# Patient Record
Sex: Female | Born: 1965 | Race: White | Hispanic: No | Marital: Married | Smoking: Never smoker
Health system: Southern US, Community
[De-identification: ages and names within clinical notes are randomized; demographics above are authoritative.]

## PROBLEM LIST (undated history)

## (undated) DIAGNOSIS — G4700 Insomnia, unspecified: Secondary | ICD-10-CM

## (undated) DIAGNOSIS — K219 Gastro-esophageal reflux disease without esophagitis: Secondary | ICD-10-CM

## (undated) DIAGNOSIS — D259 Leiomyoma of uterus, unspecified: Secondary | ICD-10-CM

## (undated) DIAGNOSIS — G5603 Carpal tunnel syndrome, bilateral upper limbs: Secondary | ICD-10-CM

## (undated) DIAGNOSIS — E785 Hyperlipidemia, unspecified: Secondary | ICD-10-CM

## (undated) DIAGNOSIS — Z8669 Personal history of other diseases of the nervous system and sense organs: Secondary | ICD-10-CM

## (undated) DIAGNOSIS — I059 Rheumatic mitral valve disease, unspecified: Secondary | ICD-10-CM

## (undated) DIAGNOSIS — F419 Anxiety disorder, unspecified: Secondary | ICD-10-CM

## (undated) DIAGNOSIS — I341 Nonrheumatic mitral (valve) prolapse: Secondary | ICD-10-CM

## (undated) DIAGNOSIS — R51 Headache: Secondary | ICD-10-CM

## (undated) DIAGNOSIS — R519 Headache, unspecified: Secondary | ICD-10-CM

## (undated) DIAGNOSIS — K449 Diaphragmatic hernia without obstruction or gangrene: Secondary | ICD-10-CM

## (undated) DIAGNOSIS — I472 Ventricular tachycardia, unspecified: Secondary | ICD-10-CM

## (undated) DIAGNOSIS — I499 Cardiac arrhythmia, unspecified: Secondary | ICD-10-CM

## (undated) DIAGNOSIS — R112 Nausea with vomiting, unspecified: Secondary | ICD-10-CM

## (undated) DIAGNOSIS — Z9889 Other specified postprocedural states: Secondary | ICD-10-CM

## (undated) DIAGNOSIS — K589 Irritable bowel syndrome without diarrhea: Secondary | ICD-10-CM

## (undated) DIAGNOSIS — M722 Plantar fascial fibromatosis: Secondary | ICD-10-CM

## (undated) DIAGNOSIS — R1031 Right lower quadrant pain: Secondary | ICD-10-CM

## (undated) DIAGNOSIS — N920 Excessive and frequent menstruation with regular cycle: Secondary | ICD-10-CM

## (undated) DIAGNOSIS — G8929 Other chronic pain: Secondary | ICD-10-CM

## (undated) HISTORY — DX: Leiomyoma of uterus, unspecified: D25.9

## (undated) HISTORY — DX: Personal history of other diseases of the nervous system and sense organs: Z86.69

## (undated) HISTORY — PX: PATELLA ARTHROPLASTY: SUR73

## (undated) HISTORY — DX: Other chronic pain: G89.29

## (undated) HISTORY — DX: Hyperlipidemia, unspecified: E78.5

## (undated) HISTORY — PX: OTHER SURGICAL HISTORY: SHX169

## (undated) HISTORY — DX: Irritable bowel syndrome, unspecified: K58.9

## (undated) HISTORY — DX: Right lower quadrant pain: R10.31

## (undated) HISTORY — DX: Excessive and frequent menstruation with regular cycle: N92.0

## (undated) HISTORY — DX: Cardiac arrhythmia, unspecified: I49.9

## (undated) HISTORY — DX: Rheumatic mitral valve disease, unspecified: I05.9

## (undated) HISTORY — DX: Plantar fascial fibromatosis: M72.2

## (undated) HISTORY — PX: SPINE SURGERY: SHX786

## (undated) HISTORY — DX: Anxiety disorder, unspecified: F41.9

## (undated) HISTORY — PX: ABDOMINAL HYSTERECTOMY: SHX81

## (undated) HISTORY — DX: Ventricular tachycardia, unspecified: I47.20

## (undated) HISTORY — DX: Diaphragmatic hernia without obstruction or gangrene: K44.9

## (undated) HISTORY — DX: Insomnia, unspecified: G47.00

## (undated) HISTORY — DX: Gastro-esophageal reflux disease without esophagitis: K21.9

## (undated) HISTORY — DX: Ventricular tachycardia: I47.2

## (undated) HISTORY — DX: Carpal tunnel syndrome, bilateral upper limbs: G56.03

## (undated) SURGERY — ESOPHAGOGASTRODUODENOSCOPY (EGD) WITH PROPOFOL
Anesthesia: Monitor Anesthesia Care

---

## 1996-01-07 HISTORY — PX: KNEE SURGERY: SHX244

## 1997-06-09 ENCOUNTER — Inpatient Hospital Stay (HOSPITAL_COMMUNITY): Admission: AD | Admit: 1997-06-09 | Discharge: 1997-06-12 | Payer: Self-pay | Admitting: Cardiology

## 1997-06-29 ENCOUNTER — Other Ambulatory Visit: Admission: RE | Admit: 1997-06-29 | Discharge: 1997-06-29 | Payer: Self-pay | Admitting: Obstetrics and Gynecology

## 1998-03-15 ENCOUNTER — Emergency Department (HOSPITAL_COMMUNITY): Admission: EM | Admit: 1998-03-15 | Discharge: 1998-03-15 | Payer: Self-pay | Admitting: Emergency Medicine

## 1998-06-24 ENCOUNTER — Emergency Department (HOSPITAL_COMMUNITY): Admission: EM | Admit: 1998-06-24 | Discharge: 1998-06-24 | Payer: Self-pay | Admitting: Emergency Medicine

## 1998-06-25 ENCOUNTER — Inpatient Hospital Stay (HOSPITAL_COMMUNITY): Admission: EM | Admit: 1998-06-25 | Discharge: 1998-06-27 | Payer: Self-pay | Admitting: Emergency Medicine

## 1998-07-23 ENCOUNTER — Other Ambulatory Visit: Admission: RE | Admit: 1998-07-23 | Discharge: 1998-07-23 | Payer: Self-pay | Admitting: Obstetrics and Gynecology

## 1998-09-20 ENCOUNTER — Encounter: Payer: Self-pay | Admitting: Obstetrics and Gynecology

## 1998-09-20 ENCOUNTER — Ambulatory Visit (HOSPITAL_COMMUNITY): Admission: RE | Admit: 1998-09-20 | Discharge: 1998-09-20 | Payer: Self-pay | Admitting: Obstetrics and Gynecology

## 1998-11-04 ENCOUNTER — Emergency Department (HOSPITAL_COMMUNITY): Admission: EM | Admit: 1998-11-04 | Discharge: 1998-11-04 | Payer: Self-pay | Admitting: *Deleted

## 1999-02-04 ENCOUNTER — Ambulatory Visit (HOSPITAL_COMMUNITY): Admission: RE | Admit: 1999-02-04 | Discharge: 1999-02-04 | Payer: Self-pay | Admitting: Neurosurgery

## 1999-02-04 ENCOUNTER — Encounter: Payer: Self-pay | Admitting: Orthopedic Surgery

## 1999-03-19 ENCOUNTER — Encounter: Payer: Self-pay | Admitting: Orthopedic Surgery

## 1999-03-19 ENCOUNTER — Ambulatory Visit (HOSPITAL_COMMUNITY): Admission: RE | Admit: 1999-03-19 | Discharge: 1999-03-19 | Payer: Self-pay | Admitting: Orthopedic Surgery

## 1999-05-07 HISTORY — PX: OTHER SURGICAL HISTORY: SHX169

## 1999-05-22 ENCOUNTER — Inpatient Hospital Stay (HOSPITAL_COMMUNITY): Admission: RE | Admit: 1999-05-22 | Discharge: 1999-05-23 | Payer: Self-pay | Admitting: Orthopaedic Surgery

## 1999-05-26 ENCOUNTER — Emergency Department (HOSPITAL_COMMUNITY): Admission: EM | Admit: 1999-05-26 | Discharge: 1999-05-26 | Payer: Self-pay | Admitting: Emergency Medicine

## 1999-05-26 ENCOUNTER — Encounter: Payer: Self-pay | Admitting: Emergency Medicine

## 1999-06-28 ENCOUNTER — Inpatient Hospital Stay (HOSPITAL_COMMUNITY): Admission: RE | Admit: 1999-06-28 | Discharge: 1999-06-29 | Payer: Self-pay | Admitting: Internal Medicine

## 1999-06-28 ENCOUNTER — Encounter: Payer: Self-pay | Admitting: Internal Medicine

## 1999-07-24 ENCOUNTER — Other Ambulatory Visit: Admission: RE | Admit: 1999-07-24 | Discharge: 1999-07-24 | Payer: Self-pay | Admitting: Obstetrics and Gynecology

## 1999-09-23 ENCOUNTER — Ambulatory Visit (HOSPITAL_COMMUNITY): Admission: RE | Admit: 1999-09-23 | Discharge: 1999-09-23 | Payer: Self-pay | Admitting: Obstetrics and Gynecology

## 1999-09-23 ENCOUNTER — Encounter: Payer: Self-pay | Admitting: Obstetrics and Gynecology

## 2000-01-07 HISTORY — PX: CARDIAC ELECTROPHYSIOLOGY MAPPING AND ABLATION: SHX1292

## 2000-08-04 ENCOUNTER — Other Ambulatory Visit: Admission: RE | Admit: 2000-08-04 | Discharge: 2000-08-04 | Payer: Self-pay | Admitting: Obstetrics and Gynecology

## 2001-09-08 ENCOUNTER — Other Ambulatory Visit: Admission: RE | Admit: 2001-09-08 | Discharge: 2001-09-08 | Payer: Self-pay | Admitting: Obstetrics and Gynecology

## 2002-12-20 ENCOUNTER — Other Ambulatory Visit: Admission: RE | Admit: 2002-12-20 | Discharge: 2002-12-20 | Payer: Self-pay | Admitting: Obstetrics and Gynecology

## 2003-02-22 ENCOUNTER — Ambulatory Visit (HOSPITAL_COMMUNITY): Admission: RE | Admit: 2003-02-22 | Discharge: 2003-02-22 | Payer: Self-pay | Admitting: Internal Medicine

## 2003-04-10 ENCOUNTER — Encounter: Payer: Self-pay | Admitting: Internal Medicine

## 2003-12-06 ENCOUNTER — Ambulatory Visit: Payer: Self-pay | Admitting: Internal Medicine

## 2003-12-25 ENCOUNTER — Ambulatory Visit: Payer: Self-pay | Admitting: Internal Medicine

## 2003-12-29 ENCOUNTER — Ambulatory Visit: Payer: Self-pay | Admitting: Internal Medicine

## 2004-05-17 ENCOUNTER — Ambulatory Visit: Payer: Self-pay | Admitting: Internal Medicine

## 2004-05-29 ENCOUNTER — Ambulatory Visit: Payer: Self-pay | Admitting: Internal Medicine

## 2004-07-19 ENCOUNTER — Ambulatory Visit (HOSPITAL_COMMUNITY): Admission: RE | Admit: 2004-07-19 | Discharge: 2004-07-19 | Payer: Self-pay | Admitting: Obstetrics and Gynecology

## 2004-07-26 ENCOUNTER — Encounter: Admission: RE | Admit: 2004-07-26 | Discharge: 2004-07-26 | Payer: Self-pay | Admitting: Obstetrics and Gynecology

## 2005-03-12 ENCOUNTER — Encounter: Admission: RE | Admit: 2005-03-12 | Discharge: 2005-03-12 | Payer: Self-pay | Admitting: Obstetrics and Gynecology

## 2005-08-18 ENCOUNTER — Ambulatory Visit: Payer: Self-pay | Admitting: Internal Medicine

## 2005-10-17 ENCOUNTER — Encounter: Admission: RE | Admit: 2005-10-17 | Discharge: 2005-10-17 | Payer: Self-pay | Admitting: Obstetrics and Gynecology

## 2006-09-28 ENCOUNTER — Ambulatory Visit: Payer: Self-pay | Admitting: Internal Medicine

## 2006-10-20 ENCOUNTER — Encounter: Admission: RE | Admit: 2006-10-20 | Discharge: 2006-10-20 | Payer: Self-pay | Admitting: Obstetrics and Gynecology

## 2007-05-06 DIAGNOSIS — K449 Diaphragmatic hernia without obstruction or gangrene: Secondary | ICD-10-CM | POA: Insufficient documentation

## 2007-05-06 DIAGNOSIS — K219 Gastro-esophageal reflux disease without esophagitis: Secondary | ICD-10-CM

## 2007-05-06 DIAGNOSIS — I499 Cardiac arrhythmia, unspecified: Secondary | ICD-10-CM | POA: Insufficient documentation

## 2007-05-06 DIAGNOSIS — I059 Rheumatic mitral valve disease, unspecified: Secondary | ICD-10-CM

## 2007-05-06 DIAGNOSIS — M722 Plantar fascial fibromatosis: Secondary | ICD-10-CM

## 2007-05-06 DIAGNOSIS — G5600 Carpal tunnel syndrome, unspecified upper limb: Secondary | ICD-10-CM

## 2007-05-06 DIAGNOSIS — K589 Irritable bowel syndrome without diarrhea: Secondary | ICD-10-CM | POA: Insufficient documentation

## 2007-06-07 ENCOUNTER — Ambulatory Visit: Payer: Self-pay | Admitting: Internal Medicine

## 2007-06-07 DIAGNOSIS — R1031 Right lower quadrant pain: Secondary | ICD-10-CM

## 2007-06-09 ENCOUNTER — Ambulatory Visit (HOSPITAL_COMMUNITY): Admission: RE | Admit: 2007-06-09 | Discharge: 2007-06-09 | Payer: Self-pay | Admitting: Internal Medicine

## 2007-10-06 ENCOUNTER — Ambulatory Visit: Payer: Self-pay | Admitting: Internal Medicine

## 2007-11-15 ENCOUNTER — Encounter: Admission: RE | Admit: 2007-11-15 | Discharge: 2007-11-15 | Payer: Self-pay | Admitting: Obstetrics and Gynecology

## 2008-10-04 DIAGNOSIS — E785 Hyperlipidemia, unspecified: Secondary | ICD-10-CM

## 2008-10-04 DIAGNOSIS — I472 Ventricular tachycardia, unspecified: Secondary | ICD-10-CM | POA: Insufficient documentation

## 2008-10-05 ENCOUNTER — Ambulatory Visit: Payer: Self-pay | Admitting: Internal Medicine

## 2009-03-20 ENCOUNTER — Encounter: Admission: RE | Admit: 2009-03-20 | Discharge: 2009-03-20 | Payer: Self-pay | Admitting: Obstetrics and Gynecology

## 2009-06-07 ENCOUNTER — Telehealth (INDEPENDENT_AMBULATORY_CARE_PROVIDER_SITE_OTHER): Payer: Self-pay | Admitting: *Deleted

## 2009-10-30 ENCOUNTER — Encounter (INDEPENDENT_AMBULATORY_CARE_PROVIDER_SITE_OTHER): Payer: Self-pay | Admitting: *Deleted

## 2009-12-18 ENCOUNTER — Telehealth (INDEPENDENT_AMBULATORY_CARE_PROVIDER_SITE_OTHER): Payer: Self-pay | Admitting: *Deleted

## 2010-01-24 ENCOUNTER — Encounter: Payer: Self-pay | Admitting: Internal Medicine

## 2010-01-24 ENCOUNTER — Ambulatory Visit
Admission: RE | Admit: 2010-01-24 | Discharge: 2010-01-24 | Payer: Self-pay | Source: Home / Self Care | Attending: Internal Medicine | Admitting: Internal Medicine

## 2010-01-27 ENCOUNTER — Encounter: Payer: Self-pay | Admitting: Obstetrics and Gynecology

## 2010-02-07 NOTE — Progress Notes (Signed)
   Spoke with Shylie on 2 Different Occasions today, She is Very persistant on picking the records up today,I called Renee W/ [facility] asking her if she recieved the ROI that was sent over, she has not recieved it yet,I called the PT back and asked her to sign another ROI when she comes to pick up the records she agreed,Pt will pick up records today. Cala Bradford Mesiemore  June 07, 2009 1:12 PM

## 2010-02-07 NOTE — Procedures (Signed)
Summary: Colonoscopy Report  Colonoscopy Report   Imported By: Lenard Forth 06/11/2009 17:05:06  _____________________________________________________________________  External Attachment:    Type:   Image     Comment:   External Document

## 2010-02-07 NOTE — Assessment & Plan Note (Signed)
Summary: F1Y/MJ  Medications Added XANAX 1 MG TABS (ALPRAZOLAM) at bedtime LIPITOR 10 MG TABS (ATORVASTATIN CALCIUM) once daily      Allergies Added:   Visit Type:  Follow-up Primary Provider:  Lucas Mallow MD   History of Present Illness: [name] ([name])  returns today for followup.  She is a pleasant middle aged woman with a h/o NSVT who returns today for followup.  In the last year she has had minimal palpitations but has gained more weight.  She states that she is exercising regularly. She admits to overeating.  She denies syncope.  Current Medications (verified): 1)  Tambocor 50 Mg  Tabs (Flecainide Acetate) .Marland Kitchen.. 1 Twice Daily 2)  Xanax 1 Mg Tabs (Alprazolam) .... At Bedtime 3)  Lipitor 10 Mg  Allergies (verified): 1)  ! Verapamil  Past History:  Past Medical History: Last updated: 10/04/2008 Current Problems:  ABNORMAL HEART RHYTHMS (ICD-427.9) MITRAL VALVE DISORDERS (ICD-424.0) VENTRICULAR TACHYCARDIA (ICD-427.1) RLQ PAIN (ICD-789.03) IBS (ICD-564.1) HIATAL HERNIA (ICD-553.3) GERD (ICD-530.81) PLANTAR FASCIITIS (ICD-728.71) CARPAL TUNNEL SYNDROME, BILATERAL (ICD-354.0) DYSLIPIDEMIA (ICD-272.4)  Past Surgical History: Last updated: 05/06/2007 knee surgery  Review of Systems  The patient denies chest pain, syncope, dyspnea on exertion, and peripheral edema.    Vital Signs:  Patient profile:   45 year old female Height:      64 inches Weight:      166 pounds BMI:     28.60 Pulse rate:   87 / minute BP sitting:   128 / 64  (left arm)  Vitals Entered By: Laurance Flatten CMA (January 24, 2010 2:44 PM)   Physical Exam  General:  Well developed, well nourished, in no acute distress.  HEENT: normal Neck: supple. No JVD. Carotids 2+ bilaterally no bruits Cor: RRR no rubs, gallops or murmur Lungs: CTA Ab: soft, nontender. nondistended. No HSM. Good bowel sounds Ext: warm. no cyanosis, clubbing or edema Neuro: alert and oriented.  Grossly nonfocal. 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  She will continue her meds. Her updated medication list for this problem includes:    Tambocor 50 Mg Tabs (Flecainide acetate) .Marland Kitchen... 1 twice daily  Problem # 2:  DYSLIPIDEMIA (ICD-272.4) I have asked that she reduce her dietary fat intake, exercise more and continue her statin therapy Her updated medication list for this problem includes:    Lipitor 10 Mg Tabs (Atorvastatin calcium) ..... Once daily  Patient Instructions: 1)  Your physician recommends that you continue on your current medications as directed. Please refer to the Current Medication list given to you today. 2)  Your physician wants you to follow-up in: 1 year  You will receive a reminder letter in the mail two months in advance. If you don't receive a letter, please call our office to schedule the follow-up appointment. Prescriptions: LIPITOR 10 MG TABS (ATORVASTATIN CALCIUM) once daily  #30 x 0   Entered by:   Claris Gladden RN   Authorized by:   Laren Boom, MD, [facility]   Signed by:   Claris Gladden RN on 01/24/2010   Method used:   [phone]       [address]       [place], [place]  [address]       Ph: [phone]  or [phone]       Fax: [phone]   RxID:   [id number] TAMBOCOR 50 MG  TABS (FLECAINIDE ACETATE) 1 twice daily  #60 Tablet x 0   Entered by:   Claris Gladden RN   Authorized by:   Laren Boom, MD, [facility]   Signed by:   Claris Gladden RN on 01/24/2010   Method used:   [facility]        [facility]  [address] [phone] (retail[facility]       [address] [place], [place]  [address]       Ph: [phone] or [phone]       Fax: [phone]   RxID:   [id number]

## 2010-02-07 NOTE — Letter (Signed)
Summary: Appointment - Missed  [facility], Main Office  [address]   [place], [place] [address]   Phone: [phone]  Fax: [phone]     October 30, 2009 MRN: [id number]   [facility] [address] [place], [place]  [address]   Dear [name],  Our records indicate you missed your appointment on 10-15-09  with Dr. Ladona Ridgel .                                    It is very important that we reach you to reschedule this appointment. We look forward to participating in your health care needs. Please contact us at the number listed above at your earliest convenience to reschedule this appointment.     Sincerely,    Glass blower/designer

## 2010-02-07 NOTE — Progress Notes (Signed)
   Request receievd from [facility] sent to [facility] Mes[facility]  December 18, 2009 1:05 PM

## 2010-04-24 ENCOUNTER — Other Ambulatory Visit: Payer: Self-pay | Admitting: Physician Assistant

## 2010-04-24 ENCOUNTER — Ambulatory Visit
Admission: RE | Admit: 2010-04-24 | Discharge: 2010-04-24 | Disposition: A | Discharge status: Home / Self Care | Payer: 59 | Source: Ambulatory Visit | Attending: Physician Assistant | Admitting: Physician Assistant

## 2010-04-24 DIAGNOSIS — M542 Cervicalgia: Secondary | ICD-10-CM

## 2010-05-13 ENCOUNTER — Other Ambulatory Visit: Payer: Self-pay | Admitting: *Deleted

## 2010-05-13 MED ORDER — ATORVASTATIN CALCIUM 10 MG PO TABS: 10.0000 mg | ORAL_TABLET | Freq: Every day | ORAL | Status: DC

## 2010-05-21 NOTE — Assessment & Plan Note (Signed)
[facility]                         ELECTROPHYSIOLOGY OFFICE NOTE   NAME:HODGESDaniya, [name]                       MRN:          [id number]  DATE:10/06/2007                            DOB:          12/24/1965    HISTORY OF PRESENT ILLNESS:  Ms. [name] returns today for followup.  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  She returns today for  followup.  She has recently undergone stress of losing her father who  died of cancer several months ago.  Despite this, she has had very  little in the way of increased palpitations or symptomatic VT.  She had  no other specific complaints today.   MEDICATIONS:  1. Flecainide 50 mg twice a day.  2. Multiple vitamin.  3. Lipitor (do not know the dose).   PHYSICAL EXAMINATION:  GENERAL:  She is a pleasant, well-appearing young  woman in no distress.  VITAL SIGNS:  Blood pressure was 106/70, the pulse was 73 and regular,  the respirations were 18.  The weight was 138 pounds down 8 pounds from  her visit a year ago.  NECK:  Revealed no jugular venous distention.  LUNGS:  Clear bilaterally to auscultation.  No wheezes, rales, or  rhonchi are present.  CARDIOVASCULAR:  Regular rate and rhythm with normal S1 and S2.  There  are no murmurs, rubs, gallops.  EXTREMITIES:  Demonstrated no edema.   Her EKG today demonstrates sinus rhythm with normal axis and intervals.   IMPRESSION:  1. 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  2. Dyslipidemia, on statin therapy.   DISCUSSION:  Ms. [name]' VT is stable or she is tolerating flecainide  very nicely with nice control of her symptoms.  She will continue  flecainide at 50 twice a day, though if her symptoms would increase,  we  would certainly think about increasing that dose as needed.   PLAN:  To see her back in 1-year or sooner should she have worsening  symptoms.     Doylene Canning. Ladona Ridgel, MD  Electronically Signed    GWT/MedQ  DD: 10/06/2007  DT: 10/07/2007  Job #: [id number]

## 2010-05-21 NOTE — Assessment & Plan Note (Signed)
[facility]                         ELECTROPHYSIOLOGY OFFICE NOTE   NAME:[name], [name]                       MRN:          [id number]  DATE:09/28/2006                            DOB:          1965-09-25    SUBJECTIVE:  Ms. [name] returns today for followup.  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  She returns  today for followup.  Overall there have been rare palpitations in the  last year even despite being on low doses of flecainide.  Otherwise,  there are no complaints today.  She states that when she lies down at  night-time, sometimes she will feel like her heart is racing for a few  beats and then stops on its own.   MEDICATIONS:  1. Flecainide 50 mg twice daily.  2. Multiple vitamins.   PHYSICAL EXAMINATION:  GENERAL APPEARANCE:  She is a pleasant well-  appearing in no distress.  VITAL SIGNS:  Blood pressure 122/66, the pulse is 70 and regular,  respirations are 18 and the weight is 146 pounds.  NECK:  Revealed no jugular venous distention.  LUNGS:  Clear bilaterally to auscultation.  No rales, rhonchi or  wheezing are present.  CARDIOVASCULAR:  Exam revealed a regular rate and rhythm with normal S1  and S2.  EXTREMITIES:  Demonstrated no edema.   IMPRESSION:  1. Nonsustained ventricular tachycardia.  2. Chronic flecainide therapy.   DISCUSSION:  Overall, Ms. [name] is stable.  Her ventricular tachycardia  has been quiescent.  I will see her back in the office in one year,  sooner should she have worsening symptoms.     Doylene Canning. Ladona Ridgel, MD  Electronically Signed    GWT/MedQ  DD: 09/28/2006  DT: 09/29/2006  Job #: [id number]

## 2010-05-24 NOTE — Op Note (Signed)
[place]  Patient:    [name], [name]                       MRN: [id number] Proc. Date: 05/22/99 Adm. 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  PROCEDURE:  Left C6-7 anterior cervical diskectomy and fusion, left iliac crest bone graft.  SURGEON:  Mark C. Ophelia Charter, M.D.  ASSISTANT:  Colon Flattery. Ollen Bowl, M.D.  ANESTHESIA:  GOT.  ESTIMATED BLOOD LOSS:  100 cc.  DESCRIPTION OF PROCEDURE:  After the induction of general anesthesia and orotracheal intubation, standard prepping and draping of left iliac crest and neck with head halter traction applied, sterile skin marker was used to mark the skin and the skin fold at the C6-7 level.  The area was squared with towels and Betadine Vidrape was applied.  Sterile Mayo stand at the head and thyroid sheets and drapes.  An incision was made in the midline, extending to the left and platysma was split in line with the fibers.  Blunt dissection was performed and there was a small vein on the under surface of the sternocleidomastoid that was bleeding and required cauterization.  Needle localization of C6-7 was performed with cross table lateral x-ray confirming this was the appropriate level.  A Cloward 12 mm drill was then drilled back to the posterior cortex.  The posterior cortex was thin and ballotable.  The hole was centered slightly superior to midline, taking a little bit more of the C6 than C7 body.  There was good cancellous bone on both surfaces and the gutters were stripped, taking out degenerative disk material.  The nucleus material actually appeared more like degenerative lumbar disk than the usual normal cervical disk material.  The posterior longitudinal ligament was taken down  with complete visualization of the cord, and there was some disk material in the lateral gutter with uncinate hypertrophy and spurs removed with 1 mm and 2 mm Kerrison using the operative microscope and microdissection technique.  Once the spurs were removed, the cord was well-decompressed.  Repeat irrigation was performed.  A 14 mm plug was taken from left ______, trimmed down to size, and impacted into place.  The patient tolerated the procedure well and was transferred to the recovery room in stable condition after closure with 3-0 Vicryl, 4-0 Vicryl in the neck with a Hemovac drain in the neck.  ______ was closed with 0 Vicryl in the fascia, 2-0 Vicryl in the subcutaneous tissue, and Marcaine in the skin. DD:  05/22/99 TD:  05/27/99 Job: [id number]

## 2010-05-24 NOTE — Assessment & Plan Note (Signed)
[facility]                           ELECTROPHYSIOLOGY OFFICE NOTE   NAME:HODGESCarie, Larsen                       MRN:          [id number]  DATE:08/18/2005                            DOB:          Mar 23, 1965    Ms. [name] returns today for followup.  She is a very pleasant 45 year old  woman with a history of RV outflow tract VT, who has been nicely controlled  over the past several years with flecainide therapy.  The patient notes that  she has recently gone off of her oral contraceptive agents.  The patient  states that several months ago she started having problems with frequent and  difficult periods, and for this reason started taking estrogen supplements.  Her periods regularized; however, she noted increasing palpitations while  she was on the estrogen.  She has subsequently stopped taking the estrogen  secondary to the above, as well as concerns that there might be some  increased risk on supplement estrogen therapy.  The patient has no coronary  disease and normal LV function and her only other problem is her problem  with gastroesophageal reflux.   On physical exam, she is a pleasant, well-appearing young woman in no  distress.  Blood pressure was 110/80.  The pulse was 83 and regular.  Respirations were 18.  The weight was 137 pounds.  The neck revealed no  jugular venous distention.  Lungs were clear bilaterally to auscultation.  There were no wheezes, rales or rhonchi.  The cardiovascular exam revealed a  regular rate and rhythm with normal S1 and S2.  There were no irregular  heart sounds noted.  I do not appreciate any murmurs today.  Extremities  demonstrated no edema.   Her EKG demonstrates a sinus rhythm with borderline left atrial enlargement.   IMPRESSION:  1. Right ventricular outflow tract ventricular tachycardia.  2. Flecainide therapy secondary to #1.  3. History of gastroesophageal reflux disease.   DISCUSSION:  Ms.   [name]' palpitations have increased on her estrogen pills.  The mechanism of this is unclear to me.  However, I have recommended that if  she desires to go back on the estrogen, that she can take 150 mg of  flecainide by taking 50 mg twice a day and taking another 50 mg as needed on  days that she is having increasing palpitations.  Ultimately, we may have to  increase her dose of flecainide to either 150 or even 200 mg daily, but for  now we will plan to proceed in this way.  I will plan to see the patient  back in 1 year, sooner should she have recurrent symptoms.                                   Doylene Canning. Ladona Ridgel, MD   GWT/MedQ  DD:  08/18/2005  DT:  08/19/2005  Job #:  [id number]

## 2010-05-24 NOTE — Procedures (Signed)
[place]  Patient:    [name], [name]                       MRN: [id number] Proc. Date: 06/27/99 Adm. Date:  [id number] Attending:  Lewayne Bunting CC:         Thomas A. Patty Sermons, M.D.             Kathrine Cords, R.N.                           Procedure Report  PROCEDURE PERFORMED:  Electrophysiologic study and attempted catheter ablation of nonsustained ventricular tachycardia.  I:  INTRODUCTION:  The patient is a very pleasant 45 year old woman with a ten year history of tachy palpitations.  These have been documented due to PVCs and nonsustained ventricular tachycardia.  The morphology of her arrhythmia is typically a left bundle inferior axis morphology.  She has typically PVCs in a bigeminal fashion. She has been treated with multiple medications including multiple beta blocker, Sotalol, and calcium channel blockers.  She was initially tried on Verapamil but had an allergic reaction to this medication. She has also been tried on Cardizem with no improvement and she notes that she feels fatigued on this medication.  The patient for this reason has requested attempted catheter ablation therapy.  She is subsequently referred for additional evaluation.  II:  PROCEDURE:  After informed consent was obtained, the patient was taken to the diagnostic EP Lab in a fasting state.  After the usual preparation and draping, 30 cc of Lidocaine was infiltrated in the right femoral region.  A 6 French quadripolar catheter was inserted percutaneously in the right femoral vein and advanced to the RV apex.  A 6 French quadripolar catheter was inserted percutaneously in the right femoral vein and advanced to the His bundle region.  A 7 French quadripolar ablation catheter was inserted percutaneously in the right femoral vein and advanced to the RV outflow track region.  At baseline, the patient had PVCs occurring in a trigeminy and quadrigeminal fashion.  There was  no sustained or nonsustained VT.  Programmed ventricular stimulation as well as rapid ventricular pacing were both carried out from the RV apex at patient cycle length of 500 and 400 msec. In addition rapid ventricular pacing was carried out at cycle lengths down to 250 msec. This demonstrated no evidence of induction of ventricular tachycardia.  This was performed utilizing S1 and S2; S1, S2 and S3; and S1, S2, S3, S4 stimuli. At this point isoproterenol was infused at 1 mcg per minute.  After about six minutes the patient had a response to isoproterenol and again; however, there was no sustained ventricular arrhythmias.  There was PVCs again in a bigeminal and trigeminal fashion.  The ablation catheter was removed into the region of the RV outflow track and mapping was carried out at multiple locations. Despite multiple sites of mapping in both the septal, anterior and free wall regions as well as the posterior region, the earliest activation sequence was no better than 0 with the surface electrocardiogram QRS.  The mapping catheter was maneuvered down into a more inferior region in the RV outflow track and this demonstrated somewhat earlier ventricular activations approximately 5 msec presystolic.  At this point, a second mapping/ablation catheter was maneuvered into the region of the right ventricle. At the most inferior portion of the RV outflow track along the anterior region  essentially out into the right ventricle along its anterior surface, sites of up to 15 to 20 msec were found which were presystolic.  It should be noted that mapping was made markedly more difficult because of PVCs arising off of the mapping catheters and because the patient occasionally had additional PVCs morphologies. In addition mapping was made difficult because the patients PVCs would frequently resolve for sustained periods of time.  A total of 6 RF energy applications were delivered to the region at the  base of the RV outflow track and anterior superior portion of the right ventricle. During RF energy application, there was no exceleration of the patients ventricular arrhythmias although there were frequent PVCs.  After each ablation the patient had recurrent PVCs.  At this point, because no sustained ventricular arrhythmias could be induced and because of the unusual location of the patients PVC focus with much difficulty in mapping, the catheters were removed.  Hemostasis was assured.  III:  COMPLICATIONS:  The patients procedure was complicated by chest pain during RF energy application. She remained hemodynamically stable and a 2D echocardiogram will be obtained to exclude any minimal pericardial effusion.  IV:  RESULTS:  A. Baseline 12 lead electrocardiogram.  The baseline 12 lead EKG demonstrates normal sinus rhythm with normal axis and intervals and frequent PVCs.  B. Baseline intervals.  The sinus node cycle length was 581 msec.  The QRS duration was 108 msec.  The AH interval was 66 msec.  HV interval 40 msec.  C. Rapid ventricular pacing.  Rapid ventricular pacing was carried out from the RV apex at a cycle length of 500 msec and decremented down to 250 msec. This demonstrated no inducible VT.  The patient did have somewhat more frequent occurring PVCs.  D. Programmed ventricular stimulation. Programmed ventricular stimulation was carried out from the RV apex at a basic dry cycle length of 500 and 400 msec. S1 and S2; S1, S2, S3; and S1, S2, S3, S4 stimuli were subsequently delivered with each carried down to ventricular refractions. During programmed ventricular stimulation there was no inducible VT.  E. Arrhythmias observed.  Rare episodes of nonsustained VT and frequent PVCs. Morphology was left bundle inferior axis.  They were rare and nonsustained.  F. Mapping.   Mapping of the patients PVCs demonstrated the earliest atrial activation at the base of the RV outflow  track anterior and superior along the right ventricle.  At this location, 6 RF energy applications were delivered  with the longest RF delivered for 32 seconds. It should be noted that the patient had excruciating chest pain during RF energy application.  There is no apparent effect on the PVC frequency.  V:  CONCLUSIONS:  Demonstrates no evidence of inducible ventricular tachycardia.  The patient had frequent PVCs up to a bigeminal fashion with rare couplets.  Attempted suppression of her PVCs was performed with 6 RF energy applications delivered to the anterior superior portion of the RV as well as the base of the RV outflow track. Despite earliest activations occurring at approximately 15 to 20 msec presystolic there was no evidence of any improvement in the patients ventricular ectopy.  PLAN:  At this time will be to obtain an MRI of the heart as well as to obtain a EKG as well as consideration of initiation of Flecainide therapy.DD: 06/27/99 TD:  06/30/99 Job: [id number]

## 2010-05-24 NOTE — Assessment & Plan Note (Signed)
[facility]                                 ON-CALL NOTE   NAME:[name], [name]                         MRN:          [id number]  DATE:01/11/2007                            DOB:          [birth year]    SUPERVISING PHYSICIAN:  Pricilla Riffle, MD, [facility]   PRIMARY CARDIOLOGIST:  Doylene Canning. Ladona Ridgel, M.D.   Ms. [name] is a 45 year old female patient of Dr. Lubertha Basque with a  history of nonsustained ventricular tachycardia who has been on  flecainide therapy for at least 3 years.  She states that her father has  recently died, and she has not been able to renew her prescription.  However, [facility] has reportedly faxed in request forms on January 06, 2007,  January 08, 2007 and January 11, 2007 requesting an authorization for the  refills, and the office has not responded to these repeated messages.  She calls this evening requesting a refill as she does not have any more  flecainide for this evening's dose.   Thus, I contacted [facility] at [phone] and authorized them to refill  flecainide 50 mg 1 tablet p.o. b.i.d. with 6 refills and 60 tablets.  They stated that they would do this.      Joellyn Rued, PA-C  Electronically Signed      Pricilla Riffle, MD, [facility]  Electronically Signed   EW/MedQ  DD: 01/11/2007  DT: 01/12/2007  Job #: [id number]

## 2010-05-24 NOTE — Discharge Summary (Signed)
[place]. [facility]  Patient:    [name], [name]                         MRN: [id number] Adm. Date:  06/27/99 Disc. Date: 06/29/99 Attending:  Rudene Christians. Ladona Ridgel, M.D. [facility] Dictator:   Tereso Newcomer, P.A.                           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  ADMISSION HISTORY:  This 45 year old female with a long history of tachy palpitations was originally seen in the office on June 19, 1999.  Tachy palpitations were found to be due to PVCs, ventricular couplets and nonsustained ventricular tachycardia.  She has reported near syncope with these. She had been treated with a number of medications in the past including multiple beta blockers, calcium channel blockers and Betapace.  Despite these therapies she continued to be symptomatic and had frequent PVCs, bigeminal fashion, episodes of nonsustained ventricular tachycardia.  These appeared to be originating from the right ventricular outflow tract region and are strongly positive at 2, 3, and F and left bundle pattern.  It was felt that the patient should underwent EP study and possible radiofrequency ablation.  LABORATORY DATA AND OTHER ANCILLARY TEST:  EKG on June 28, 1999 showed normal sinus rhythm.  2D echocardiogram on June 27, 1999 showed normal left ventricular wall thickness and normal ejection fraction. No mitral regurgitation noted.  Trace tricuspid regurgitation noted.  No aortic stenosis or aortic regurgitation noted.  HOSPITAL COURSE:  On June 27, 1999 the patient underwent electrophysiology study.  Attempted radiofrequency ablation of nonsustained ventricular tachycardia was performed without clear cut success.  The study showed that the PVCs mapped to the anterior right ventricle and not the right ventricular outflow  track.  There was no inducible ventricular tachycardia.  The patient tolerated the procedure well.  She remained stable after the procedure and her telemetry only showed some PVCs as well as normal sinus rhythm.  Flecainide 50 mg q.12h. was initiated.  She remained stable on June 28, 1999 with normal sinus rhythm and no ectopy.  She was sent for MRI and SAECG to assess for right ventricular dysplasia.  On the morning of June 29, 1999 she continued to remain stable.  She had no complaints of palpitations or shortness of breath. A telemetry showed normal sinus rhythm and no alarms were recorded in the chart.  It was felt she was stable enough for discharge to home.  DISCHARGE MEDICATIONS: 1. Flecainide 50 mg p.o. q.12h. 2. Aciphex as directed.  ACTIVITY:  As tolerated.  The patient should refrain from driving and heavy exertion for three days.  DIET: She should maintain a low fat, low cholesterol, low sodium diet.  FOLLOW-UP: With Dr. Ladona Ridgel in three weeks on July 17, 1999 at 3:15 p.m.    DD: 06/29/99 TD:  06/29/99 Job: [id number]

## 2010-05-24 NOTE — Consult Note (Signed)
[place]. [facility]  Patient:    [name], [name]                       MRN: [id number] Adm. Date:  [id number] Attending:  Osvaldo Human                          Consultation Report  OUTPATIENT EVALUATION:  CHIEF COMPLAINT:  Difficulty swallowing.  HISTORY OF PRESENT ILLNESS:  [name] is a 45 year old female who is five days out from ACDF performed by Dr. Ophelia Charter.  She had some nausea and vomiting after the surgery and reports difficulty swallowing.  She woke up this morning and had some difficulty, reported gasping.  The patient has been eating soup and soft mechanical diet.  PHYSICAL EXAMINATION:  On physical examination, grip, EPL, FPL, in her biceps, triceps, deltoid, strength is 5/5, sensation to light touch intact C5 to T1. The incision on her neck is intact without evidence of disruption or soft tissue swelling or hematoma.  LABORATORY DATA:  Plain x-rays of the cervical spine show no change of position of the bone block.  CT scan shows very mild soft tissue swelling around the esophagus consistent with postoperative changes.  There is no compression on the trachea.  There is no inordinate swelling in the esophagus.  IMPRESSION:  Typical postoperative soft tissue swelling following anterior cervical diskectomy and fusion without evidence of dislodgement of the bone plug, or hematoma formation.  PLAN:  I am going to put her on Celebrex 200 mg twice a day for the next seven days.  She is to keep her follow-up appointment with Dr. Ophelia Charter next Friday. DD:  05/26/99 TD:  05/27/99 Job: [id number]

## 2010-09-19 ENCOUNTER — Other Ambulatory Visit: Payer: Self-pay | Admitting: Obstetrics and Gynecology

## 2010-09-19 DIAGNOSIS — Z1231 Encounter for screening mammogram for malignant neoplasm of breast: Secondary | ICD-10-CM

## 2010-10-08 ENCOUNTER — Ambulatory Visit
Admission: RE | Admit: 2010-10-08 | Discharge: 2010-10-08 | Disposition: A | Discharge status: Home / Self Care | Payer: 59 | Source: Ambulatory Visit | Attending: Obstetrics and Gynecology | Admitting: Obstetrics and Gynecology

## 2010-10-08 DIAGNOSIS — Z1231 Encounter for screening mammogram for malignant neoplasm of breast: Secondary | ICD-10-CM

## 2010-10-15 ENCOUNTER — Encounter: Payer: Self-pay | Admitting: Physician Assistant

## 2010-12-11 ENCOUNTER — Ambulatory Visit (INDEPENDENT_AMBULATORY_CARE_PROVIDER_SITE_OTHER): Payer: 59

## 2010-12-11 DIAGNOSIS — R112 Nausea with vomiting, unspecified: Secondary | ICD-10-CM

## 2010-12-11 DIAGNOSIS — H8110 Benign paroxysmal vertigo, unspecified ear: Secondary | ICD-10-CM

## 2010-12-23 NOTE — Progress Notes (Signed)
This encounter was created in error - please disregard.

## 2011-01-28 ENCOUNTER — Encounter: Payer: Self-pay | Admitting: Internal Medicine

## 2011-01-31 ENCOUNTER — Ambulatory Visit: Payer: 59 | Admitting: Internal Medicine

## 2011-01-31 ENCOUNTER — Telehealth: Payer: Self-pay | Admitting: Internal Medicine

## 2011-01-31 MED ORDER — FLECAINIDE ACETATE 50 MG PO TABS: 50.0000 mg | ORAL_TABLET | Freq: Two times a day (BID) | ORAL | Status: DC

## 2011-01-31 NOTE — Telephone Encounter (Signed)
Pt [facility]

## 2011-02-07 ENCOUNTER — Encounter: Payer: Self-pay | Admitting: Internal Medicine

## 2011-02-07 ENCOUNTER — Ambulatory Visit (INDEPENDENT_AMBULATORY_CARE_PROVIDER_SITE_OTHER): Payer: 59 | Admitting: Internal Medicine

## 2011-02-07 VITALS — BP 112/76 | HR 75 | Ht 64.0 in | Wt 170.8 lb

## 2011-02-07 DIAGNOSIS — I472 Ventricular tachycardia: Secondary | ICD-10-CM

## 2011-02-07 DIAGNOSIS — E785 Hyperlipidemia, unspecified: Secondary | ICD-10-CM

## 2011-02-07 NOTE — Progress Notes (Signed)
HPI [name] returns today for followup. She is a very pleasant 46 year old woman with a history of right ventricular outflow tract ventricular tachycardia which has been well controlled with flecainide. She denies chest pain, shortness of breath, or syncope. She notes occasional palpitations. She has had occasional episodes of peripheral edema. She is frustrated by her inability to lose weight. She has gained nearly 40 pounds in the last 10 years. Allergies  Allergen Reactions  . Crestor (Rosuvastatin Calcium)   . Trazodone And Nefazodone   . Verapamil   . Latex Rash     Current Outpatient Prescriptions  Medication Sig Dispense Refill  . atorvastatin (LIPITOR) 10 MG tablet Take 1 tablet (10 mg total) by mouth daily.  30 tablet  11  . Calcium Carbonate (CALCIUM 500 PO) Take by mouth daily.      . flecainide (TAMBOCOR) 50 MG tablet Take 1 tablet (50 mg total) by mouth 2 (two) times daily.  60 tablet  0  . FLUoxetine (PROZAC) 10 MG capsule Take 10 mg by mouth daily.      . Multiple Vitamins-Minerals (MULTIVITAMIN WITH MINERALS) tablet Take 1 tablet by mouth daily.         Past Medical History  Diagnosis Date  . Mixed hyperlipidemia   . Insomnia   . Arrhythmia ventricular     s/p ablation 2001  . Menorrhagia   . Metrorrhagia   . Uterine fibroid     small  . Mitral valve disorder   . Ventricular tachycardia   . Chronic RLQ pain   . IBS (irritable bowel syndrome)   . Hiatal hernia   . GERD (gastroesophageal reflux disease)   . Plantar fasciitis   . Carpal tunnel syndrome, bilateral   . Dyslipidemia     ROS:   All systems reviewed and negative except as noted in the HPI.   Past Surgical History  Procedure Date  . Patella arthroplasty   . Cspine 05/1999    c5-6  . Echocardiogram 2000/2001     Family History  Problem Relation Age of Onset  . Hiatal hernia Mother   . Irritable bowel syndrome Mother   . Colon cancer Neg Hx      History   Social History    . Marital Status: Married    Spouse Name: N/A    Number of Children: N/A  . Years of Education: N/A   Occupational History  . Not on file.   Social History Main Topics  . Smoking status: Never Smoker   . Smokeless tobacco: Not on file  . Alcohol Use: No  . Drug Use: Not on file  . Sexually Active: Not on file   Other Topics Concern  . Not on file   Social History Narrative  . No narrative on file     BP 112/76  Pulse 75  Ht 5\' 4"  (1.626 m)  Wt 77.474 kg (170 lb 12.8 oz)  BMI 29.32 kg/m2  Physical Exam:  Well appearing middle-aged woman, NAD HEENT: Unremarkable Neck:  No JVD, no thyromegally Lungs:  Clear with no wheezes, rales, or rhonchi. HEART:  Regular rate rhythm, no murmurs, no rubs, no clicks Abd:  soft, positive bowel sounds, no organomegally, no rebound, no guarding Ext:  2 plus pulses, no edema, no cyanosis, no clubbing Skin:  No rashes no nodules Neuro:  CN II through XII intact, motor grossly intact  EKG Normal sinus rhythm with nonspecific ST-T wave abnormality.   Assess/Plan:

## 2011-02-07 NOTE — Assessment & Plan Note (Signed)
For now, she will continue her Lipitor. I have requested that the patient attempt to lose weight and reduce her fat intake.

## 2011-02-07 NOTE — Assessment & Plan Note (Signed)
Her symptoms continue to be well controlled with flecainide. She will continue her current medical therapy. I have recommended that she be allowed to take an additional dose if needed for breakthrough palpitations.

## 2011-02-07 NOTE — Patient Instructions (Signed)
Your physician wants you to follow-up in: 12 months with Dr. Taylor. You will receive a reminder letter in the mail two months in advance. If you don't receive a letter, please call our office to schedule the follow-up appointment.    

## 2011-02-09 ENCOUNTER — Other Ambulatory Visit: Payer: Self-pay | Admitting: Physician Assistant

## 2011-03-11 ENCOUNTER — Other Ambulatory Visit: Payer: Self-pay | Admitting: Family Medicine

## 2011-03-11 MED ORDER — ALPRAZOLAM 1 MG PO TABS: ORAL_TABLET | ORAL | Status: DC

## 2011-03-12 ENCOUNTER — Other Ambulatory Visit: Payer: Self-pay

## 2011-03-12 NOTE — Telephone Encounter (Signed)
Pt is requesting a change of medication on her ALPRAZolam (XANAX) 1 MG tablet States she has been getting 1/2  Please call

## 2011-03-12 NOTE — Telephone Encounter (Signed)
LMOM TO CB WITH MORE INFORMATION.

## 2011-03-13 ENCOUNTER — Telehealth: Payer: Self-pay | Admitting: Family Medicine

## 2011-03-13 MED ORDER — ALPRAZOLAM 1 MG PO TABS: 1.0000 mg | ORAL_TABLET | Freq: Every evening | ORAL | Status: DC | PRN

## 2011-03-13 NOTE — Telephone Encounter (Signed)
Correct dose printed.

## 2011-03-13 NOTE — Telephone Encounter (Signed)
Pt is here at 89 w/daughter and clarified what is needed w/regard to her xanax Rx. She has always previously had Rx written for 1/2 - 1 tab Qhs, but last time and time before it was only written for 1/2 Qhs. She usually starts taking 1/2, but then most of the time she needs the other 1/2 in middle of the night. She last had it filled 02/06/11, but pharmacy will not fill now d/t it looking like she is getting it again too soon. Can we change Rx back to 1/2 - 1, and let pharmacy know it is OK for her to get filled now?

## 2011-03-13 NOTE — Telephone Encounter (Signed)
[name] received note that patient needed her Xanax refilled while her daughter was in the office today.  [name] ordered med, but for some reason it did not print.  I called rx into [facility] and let pt know.

## 2011-03-23 ENCOUNTER — Other Ambulatory Visit: Payer: Self-pay | Admitting: Internal Medicine

## 2011-03-24 ENCOUNTER — Other Ambulatory Visit: Payer: Self-pay | Admitting: Internal Medicine

## 2011-03-24 MED ORDER — FLECAINIDE ACETATE 50 MG PO TABS: 50.0000 mg | ORAL_TABLET | Freq: Two times a day (BID) | ORAL | Status: DC

## 2011-03-24 NOTE — Telephone Encounter (Signed)
New Msg: pt took last pill this AM and takes two pills per day one in AM and one in PM. Please call RX in ASAP.

## 2011-04-01 ENCOUNTER — Other Ambulatory Visit: Payer: Self-pay | Admitting: Physician Assistant

## 2011-04-02 ENCOUNTER — Other Ambulatory Visit: Payer: Self-pay | Admitting: Physician Assistant

## 2011-04-11 ENCOUNTER — Telehealth: Payer: Self-pay

## 2011-04-11 MED ORDER — ALPRAZOLAM 1 MG PO TABS: 1.0000 mg | ORAL_TABLET | Freq: Every evening | ORAL | Status: DC | PRN

## 2011-04-11 NOTE — Telephone Encounter (Signed)
.  [facility] from [facility] called to state that they have had no response regarding the patient's Rx for Alprazolam.  [facility] to [facility] refill at [phone].

## 2011-04-11 NOTE — Telephone Encounter (Signed)
Pharm notified.

## 2011-04-11 NOTE — Telephone Encounter (Signed)
Need chart

## 2011-04-11 NOTE — Telephone Encounter (Signed)
No request received. Ok for 1 refill.  [name]

## 2011-04-11 NOTE — Telephone Encounter (Signed)
Addended by: Sondra Barges on: 04/11/2011 03:33 PM   Modules accepted: Orders

## 2011-05-20 ENCOUNTER — Other Ambulatory Visit: Payer: Self-pay | Admitting: Internal Medicine

## 2011-05-20 MED ORDER — ATORVASTATIN CALCIUM 10 MG PO TABS: 10.0000 mg | ORAL_TABLET | Freq: Every day | ORAL | Status: DC

## 2011-05-22 ENCOUNTER — Telehealth: Payer: Self-pay

## 2011-05-22 NOTE — Telephone Encounter (Signed)
Pt has contacted her pharmacy to have them call in or surescript rx for xanax, she still has had no response, she called original rx refill in on the 11th, pt called pharmacy yesterday and they still have'nt heard a response from [facility]

## 2011-05-23 MED ORDER — ALPRAZOLAM 1 MG PO TABS: 1.0000 mg | ORAL_TABLET | Freq: Every evening | ORAL | Status: DC | PRN

## 2011-05-23 NOTE — Telephone Encounter (Signed)
Yes.  Rx printed

## 2011-05-23 NOTE — Telephone Encounter (Signed)
Pharmacy is calling to request rx refill on xanax pharmacy states they faxed once on the 15th and 16th and no response. [facility] [phone] [facility].Marland Kitchen

## 2011-05-23 NOTE — Telephone Encounter (Signed)
[name], OK to RF? I don't know what happened to the earlier reqs from pharm, but when they called on 5/16 and 5/17 the messages were not routed to the inbasket. I found it in the chart after pt LM on my VM asking about it bc she is leaving town tonight and needs for sleep. Pts chart in your box.

## 2011-05-23 NOTE — Telephone Encounter (Signed)
Called [facility] as written and [facility] for pt to notify.

## 2011-06-12 ENCOUNTER — Telehealth: Payer: Self-pay

## 2011-06-14 ENCOUNTER — Ambulatory Visit: Payer: 59

## 2011-06-14 ENCOUNTER — Ambulatory Visit (INDEPENDENT_AMBULATORY_CARE_PROVIDER_SITE_OTHER): Payer: 59 | Admitting: Internal Medicine

## 2011-06-14 VITALS — BP 106/72 | HR 80 | Temp 98.5°F | Resp 18 | Ht 64.75 in | Wt 170.0 lb

## 2011-06-14 DIAGNOSIS — J9801 Acute bronchospasm: Secondary | ICD-10-CM

## 2011-06-14 DIAGNOSIS — R05 Cough: Secondary | ICD-10-CM

## 2011-06-14 DIAGNOSIS — Z5181 Encounter for therapeutic drug level monitoring: Secondary | ICD-10-CM

## 2011-06-14 DIAGNOSIS — R0602 Shortness of breath: Secondary | ICD-10-CM

## 2011-06-14 DIAGNOSIS — E785 Hyperlipidemia, unspecified: Secondary | ICD-10-CM

## 2011-06-14 LAB — COMPREHENSIVE METABOLIC PANEL
Albumin: 4.7 g/dL (ref 3.5–5.2)
BUN: 16 mg/dL (ref 6–23)
CO2: 29 mEq/L (ref 19–32)
Calcium: 9.5 mg/dL (ref 8.4–10.5)
Chloride: 105 mEq/L (ref 96–112)
Glucose, Bld: 94 mg/dL (ref 70–99)
Potassium: 4.8 mEq/L (ref 3.5–5.3)
Sodium: 143 mEq/L (ref 135–145)
Total Protein: 7 g/dL (ref 6.0–8.3)

## 2011-06-14 LAB — LIPID PANEL
HDL: 57 mg/dL (ref >39)
Triglycerides: 59 mg/dL (ref <150)

## 2011-06-14 MED ORDER — ALBUTEROL SULFATE HFA 108 (90 BASE) MCG/ACT IN AERS: 2.0000 | INHALATION_SPRAY | RESPIRATORY_TRACT | Status: DC | PRN

## 2011-06-14 MED ORDER — HYDROCODONE-ACETAMINOPHEN 7.5-500 MG/15ML PO SOLN: 5.0000 mL | Freq: Four times a day (QID) | ORAL | Status: AC | PRN

## 2011-06-14 NOTE — Patient Instructions (Signed)
Bronchospasm, Adult  Bronchospasm means that there is a spasm or tightening of the airways going into the lungs. Because the airways go into a spasm and get smaller it makes breathing more difficult.  For reasons not completely known, workings (functions) of the airways designed to protect the lungs become over active. This causes the airways to become more sensitive to:  · Infection.  · Weather.  · Exercise.  · Irritants.  · Things that cause allergic reactions or allergies (allergens).  Frequent coughing or respiratory episodes should be checked for the cause. This condition may be made worse by exercise.  CAUSES   Inflammation is often the cause of this condition. Allergy, viral respiratory infections, or irritants in the air often cause this problem. Allergic reactions produce immediate and delayed responses. Late reactions may produce more serious inflammation. This may lead to increased reactivity of the airways. Sometimes this is inherited.  Some common triggers are:  · Allergies.  · Infection commonly triggers attacks. Antibiotics are not helpful for viral infections and usually do not help with attacks of bronchospasm.  · Exercise (running, etc.) can trigger an attack. Proper pre-exercise medications help most individuals participate in sports. Swimming is the least likely sport to cause problems.  · Irritants (for example, pollution, cigarette smoke, strong odors, aerosol sprays, paint fumes, etc.) may trigger attacks. You cannot smoke and do not allow smoking in your home. This is absolutely necessary. Show this instruction to mates, relatives and significant others that may not agree with you.  · Weather changes may cause lung problems but moving around trying to find an ideal climate does not seem to be overly helpful. Winds increase molds and pollens in the air. Rain refreshes the air by washing irritants out. Cold air may cause irritation.  · Emotional problems do not cause lung problems but can  trigger attacks.  SYMPTOMS   Wheezing is the most common symptom. Frequent coughing (with or without exercise and or crying) and repeated respiratory infections are all early warning signs of bronchospasm. Chest tightness and shortness of breath are other symptoms.  DIAGNOSIS   Early hidden bronchospasm may go for long periods of time without being detected. This is especially true if wheezing cannot be detected by your caregiver. Lung (pulmonary) function studies may help with diagnosis in these cases.  HOME CARE INSTRUCTIONS   · It is necessary to remain calm during an attack. Try to relax and breathe more slowly. During this time medications may be given. If any breathing problems seem to be getting worse and are unresponsive to treatment seek immediate medical care.  · If you have severe breathing difficulty or have had a life threatening attack it is probably a good idea for you to learn how to give adrenaline (epi-pen) or use an anaphylaxis kit. Your caregiver can help you with this. These are the same kits carried by people who have severe allergic reactions. This is especially important if you do not have readily accessible medical care.  · With any severe breathing problems where epinephrine (adrenaline) has been given at home call 911 immediately as the delayed reaction may be even more severe.  SEEK MEDICAL CARE IF:   · There is wheezing and shortness of breath, even if medications are given to prevent attacks.  · An oral temperature above 102° F (38.9° C) develops.  · There are muscle aches, chest pain, or thickening of sputum.  · The sputum changes from clear or white to yellow, green,   gray, or bloody.  · There are problems that may be related to the medicine you are given, such as a rash, itching, swelling, or trouble breathing.  SEEK IMMEDIATE MEDICAL CARE IF:   · The usual medicines do not stop your wheezing, or there is increased coughing.  · You have increased difficulty breathing.  MAKE SURE YOU:    · Understand these instructions.  · Will watch your condition.  · Will get help right away if you are not doing well or get worse.  Document Released: 12/26/2002 Document Revised: 12/12/2010 Document Reviewed: 08/11/2007  Exit[employer]® Patient Information ©2012 [employer].

## 2011-06-14 NOTE — Progress Notes (Signed)
  Subjective:    Patient ID: [name], female    DOB: Nov 06, 1965, 46 y.o.   MRN: [id number]  HPI C/o new cough, occ wheezing, sob and doe of gradual onset over last 6-8 mos. Has no allergy or asthma hx. Has hx of ablation for svt and is on flecanide-- one of its serious side affects is a pneumonitis that comes on after years of use. Also requests lipid level ck because she lowered her dose of lipitor to 10mg  4d/wk due to muscle aches   Review of Systems See hx    Objective:   Physical Exam  Constitutional: She is oriented to person, place, and time. She appears well-developed and well-nourished. No distress.  HENT:  Mouth/Throat: Oropharynx is clear and moist.  Eyes: EOM are normal.  Cardiovascular: Normal rate, regular rhythm and normal heart sounds.  Exam reveals no gallop.   Pulmonary/Chest: Effort normal and breath sounds normal. She has no wheezes.  Neurological: She is alert and oriented to person, place, and time.  Skin: Skin is warm and dry.  Psychiatric: She has a normal mood and affect.    PFR 420, nl 420 [facility] reading (PRIMARY) by  Dr.Eusevio Schriver NAD on cxr, is on flecanide r/o pneumonitis side affect       Assessment & Plan:  Bronchospasm intermittent SOB/DOE---? flecanide side affect/ must see and discuss with Dr. Sharrell Ku MD Labs sent

## 2011-06-15 ENCOUNTER — Encounter: Payer: Self-pay | Admitting: Radiology

## 2011-06-17 ENCOUNTER — Telehealth: Payer: Self-pay | Admitting: Internal Medicine

## 2011-06-17 NOTE — Telephone Encounter (Signed)
Left message for patient to return my call.

## 2011-06-17 NOTE — Telephone Encounter (Signed)
New msg Pt wants to talk to you about side effects of one of her meds. Please call

## 2011-06-18 ENCOUNTER — Other Ambulatory Visit: Payer: Self-pay | Admitting: *Deleted

## 2011-06-18 MED ORDER — ATORVASTATIN CALCIUM 10 MG PO TABS: 10.0000 mg | ORAL_TABLET | Freq: Every day | ORAL | Status: DC

## 2011-06-18 MED ORDER — FLECAINIDE ACETATE 50 MG PO TABS: 50.0000 mg | ORAL_TABLET | Freq: Two times a day (BID) | ORAL | Status: DC

## 2011-06-18 NOTE — Telephone Encounter (Signed)
Refilled Flecainide acetate and atorvastatin.

## 2011-06-19 NOTE — Telephone Encounter (Signed)
lmom for patient to return my call 

## 2011-06-22 ENCOUNTER — Other Ambulatory Visit: Payer: Self-pay

## 2011-06-22 ENCOUNTER — Other Ambulatory Visit: Payer: Self-pay | Admitting: *Deleted

## 2011-06-22 MED ORDER — ALPRAZOLAM 1 MG PO TABS: 1.0000 mg | ORAL_TABLET | Freq: Every evening | ORAL | Status: DC | PRN

## 2011-06-22 NOTE — Telephone Encounter (Signed)
Patient calling for refill on Alprazolam. She states the pharmacy does not accept escripts.  [facility]

## 2011-06-23 NOTE — Telephone Encounter (Signed)
Fu call °Patient returning your call °

## 2011-06-23 NOTE — Telephone Encounter (Signed)
lmom for pt to return my call. 

## 2011-06-24 NOTE — Telephone Encounter (Signed)
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  ? Pneumonitis.  She needs a follow up with Dr Ladona Ridgel.  I have let her know it may be a few weeks and Melissa will call to schedule her.  The best number to reach her is her office number during the day [phone]

## 2011-07-04 ENCOUNTER — Encounter: Payer: Self-pay | Admitting: Internal Medicine

## 2011-07-04 ENCOUNTER — Ambulatory Visit (INDEPENDENT_AMBULATORY_CARE_PROVIDER_SITE_OTHER): Payer: 59 | Admitting: Internal Medicine

## 2011-07-04 VITALS — BP 108/62 | HR 78 | Ht 64.0 in | Wt 171.8 lb

## 2011-07-04 DIAGNOSIS — I472 Ventricular tachycardia, unspecified: Secondary | ICD-10-CM

## 2011-07-04 DIAGNOSIS — R0602 Shortness of breath: Secondary | ICD-10-CM

## 2011-07-04 LAB — SEDIMENTATION RATE: Sed Rate: 1 mm/hr (ref 0–22)

## 2011-07-04 NOTE — Patient Instructions (Signed)
Your physician recommends that you return for lab work in: ESR  Your physician has recommended that you have a pulmonary function test. Pulmonary Function Tests are a group of tests that measure how well air moves in and out of your lungs.

## 2011-07-06 ENCOUNTER — Encounter: Payer: Self-pay | Admitting: Internal Medicine

## 2011-07-06 NOTE — Assessment & Plan Note (Signed)
Her VT is controlled. There is a question of whether she might have pneumonitis asked by her primary MD. I have recommended a PFT and DLCO and an ESR. My clinical suspecion is very low.

## 2011-07-06 NOTE — Progress Notes (Signed)
HPI Mrs. Heldman returns today for followup. She is a pleasant 46 yo woman who I saw several months ago. She has done well except for nasal discharge and a cough. She was told that she might have pneumonitis. No fevers, chills or productive sputum. Minimal cough which is not productive. Allergies  Allergen Reactions  . Crestor (Rosuvastatin Calcium)   . Trazodone And Nefazodone   . Verapamil   . Latex Rash     Current Outpatient Prescriptions  Medication Sig Dispense Refill  . albuterol (PROVENTIL HFA;VENTOLIN HFA) 108 (90 BASE) MCG/ACT inhaler Inhale 2 puffs into the lungs every 4 (four) hours as needed for wheezing.  1 Inhaler  3  . ALPRAZolam (XANAX) 1 MG tablet Take 1 tablet (1 mg total) by mouth at bedtime as needed for sleep.  30 tablet  0  . atorvastatin (LIPITOR) 10 MG tablet Take 1 tablet (10 mg total) by mouth daily.  90 tablet  3  . flecainide (TAMBOCOR) 50 MG tablet Take 1 tablet (50 mg total) by mouth 2 (two) times daily.  180 tablet  3  . Multiple Vitamins-Minerals (MULTIVITAMIN WITH MINERALS) tablet Take 1 tablet by mouth daily.         Past Medical History  Diagnosis Date  . Mixed hyperlipidemia   . Insomnia   . Arrhythmia ventricular     s/p ablation 2001  . Menorrhagia   . Metrorrhagia   . Uterine fibroid     small  . Mitral valve disorder   . Ventricular tachycardia   . Chronic RLQ pain   . IBS (irritable bowel syndrome)   . Hiatal hernia   . GERD (gastroesophageal reflux disease)   . Plantar fasciitis   . Carpal tunnel syndrome, bilateral   . Dyslipidemia     ROS:   All systems reviewed and negative except as noted in the HPI.   Past Surgical History  Procedure Date  . Patella arthroplasty   . Cspine 05/1999    c5-6  . Echocardiogram 2000/2001     Family History  Problem Relation Age of Onset  . Hiatal hernia Mother   . Irritable bowel syndrome Mother   . Colon cancer Neg Hx      History   Social History  . Marital Status:  Married    Spouse Name: N/A    Number of Children: N/A  . Years of Education: N/A   Occupational History  . Not on file.   Social History Main Topics  . Smoking status: Never Smoker   . Smokeless tobacco: Not on file  . Alcohol Use: No  . Drug Use: Not on file  . Sexually Active: Not on file   Other Topics Concern  . Not on file   Social History Narrative  . No narrative on file     BP 108/62  Pulse 78  Ht 5\' 4"  (1.626 m)  Wt 171 lb 12.8 oz (77.928 kg)  BMI 29.49 kg/m2  LMP 06/14/2011  Physical Exam:  Well appearing middle aged woman, NAD HEENT: Unremarkable Neck:  No JVD, no thyromegally Lungs:  Clear with no wheezes or rales. HEART:  Regular rate rhythm, no murmurs, no rubs, no clicks Abd:  soft, positive bowel sounds, no organomegally, no rebound, no guarding Ext:  2 plus pulses, no edema, no cyanosis, no clubbing Skin:  No rashes no nodules Neuro:  CN II through XII intact, motor grossly intact  Assess/Plan:

## 2011-07-21 ENCOUNTER — Telehealth: Payer: Self-pay

## 2011-07-21 NOTE — Telephone Encounter (Signed)
The patient called to request refill of her Alprazolam Rx.  Please call the patient at [phone].

## 2011-07-22 MED ORDER — ALPRAZOLAM 1 MG PO TABS: 1.0000 mg | ORAL_TABLET | Freq: Every evening | ORAL | Status: DC | PRN

## 2011-07-22 NOTE — Telephone Encounter (Signed)
Done and printed

## 2011-07-23 ENCOUNTER — Ambulatory Visit (INDEPENDENT_AMBULATORY_CARE_PROVIDER_SITE_OTHER): Payer: 59 | Admitting: Internal Medicine

## 2011-07-23 DIAGNOSIS — R0602 Shortness of breath: Secondary | ICD-10-CM

## 2011-07-23 LAB — PULMONARY FUNCTION TEST

## 2011-07-23 NOTE — Progress Notes (Signed)
PFT done today. 

## 2011-07-30 ENCOUNTER — Encounter: Payer: Self-pay | Admitting: Internal Medicine

## 2011-08-05 ENCOUNTER — Telehealth: Payer: Self-pay | Admitting: Internal Medicine

## 2011-08-05 NOTE — Telephone Encounter (Signed)
New msg Pt had a pulmonary function test and hasnt heard any results

## 2011-08-06 NOTE — Telephone Encounter (Signed)
Patient aware of results.

## 2011-08-20 ENCOUNTER — Telehealth: Payer: Self-pay

## 2011-08-20 NOTE — Telephone Encounter (Signed)
PT STATES SHE NEED A REFILL ON HER ALPRAZOLAM AND IT REQUIRES A PRIOR AUTHORIZATION. PLEASE CALL [phone]    [facility]

## 2011-08-21 MED ORDER — ALPRAZOLAM 1 MG PO TABS: 1.0000 mg | ORAL_TABLET | Freq: Every evening | ORAL | Status: DC | PRN

## 2011-08-21 NOTE — Telephone Encounter (Signed)
Rx done. 

## 2011-08-21 NOTE — Telephone Encounter (Signed)
Called pt advised Rx sent in for her.

## 2011-08-22 ENCOUNTER — Telehealth: Payer: Self-pay | Admitting: Radiology

## 2011-08-22 NOTE — Telephone Encounter (Signed)
Called [facility] in [facility] [place]

## 2011-08-22 NOTE — Telephone Encounter (Signed)
Xanax did not go through pharmacy wants resubmitted.

## 2011-09-18 ENCOUNTER — Telehealth: Payer: Self-pay

## 2011-09-18 MED ORDER — ALPRAZOLAM 1 MG PO TABS: 1.0000 mg | ORAL_TABLET | Freq: Every evening | ORAL | Status: DC | PRN

## 2011-09-18 NOTE — Telephone Encounter (Signed)
Ok to refill for 09/21/2011?

## 2011-09-18 NOTE — Telephone Encounter (Signed)
Pt is calling to refill xanax refill.  [facility] [address] [facility] [place]  Best # for her [phone]

## 2011-09-18 NOTE — Telephone Encounter (Signed)
[facility] notifying patient rx faxed in.

## 2011-09-18 NOTE — Telephone Encounter (Signed)
Rx done and ready to be sent to pharmacy. Please let patient know that in the future when she needs a refill she should contact the pharmacy and they will send [place] a request.

## 2011-10-13 ENCOUNTER — Telehealth: Payer: Self-pay

## 2011-10-13 MED ORDER — ALPRAZOLAM 1 MG PO TABS: 1.0000 mg | ORAL_TABLET | Freq: Every evening | ORAL | Status: DC | PRN

## 2011-10-13 NOTE — Telephone Encounter (Signed)
Rx printed

## 2011-10-13 NOTE — Telephone Encounter (Signed)
Patient called to refill alprazolam. She says she is early but she is going out of town Wednesday for a week so will run out while she is away.  [facility] [phone]

## 2011-10-13 NOTE — Telephone Encounter (Signed)
Faxed in RX. Called pt to let her know

## 2011-11-16 ENCOUNTER — Telehealth: Payer: Self-pay

## 2011-11-16 MED ORDER — ALPRAZOLAM 1 MG PO TABS: 1.0000 mg | ORAL_TABLET | Freq: Every evening | ORAL | Status: DC | PRN

## 2011-11-16 NOTE — Telephone Encounter (Signed)
Patient notified

## 2011-11-16 NOTE — Telephone Encounter (Signed)
At tl desk 

## 2011-11-16 NOTE — Telephone Encounter (Signed)
Pt needs refill on prescription Alprazolam. [facility] stated [facility] [address] [phone]) Needs this to be called in, not faxed. Any questions pt may be reached @ [phone]

## 2011-12-10 ENCOUNTER — Ambulatory Visit
Admission: RE | Admit: 2011-12-10 | Discharge: 2011-12-10 | Disposition: A | Discharge status: Home / Self Care | Payer: 59 | Source: Ambulatory Visit | Attending: Emergency Medicine | Admitting: Emergency Medicine

## 2011-12-10 ENCOUNTER — Ambulatory Visit (INDEPENDENT_AMBULATORY_CARE_PROVIDER_SITE_OTHER): Payer: 59 | Admitting: Emergency Medicine

## 2011-12-10 VITALS — BP 115/77 | HR 85 | Temp 98.2°F | Resp 17 | Ht 65.0 in | Wt 167.0 lb

## 2011-12-10 DIAGNOSIS — H538 Other visual disturbances: Secondary | ICD-10-CM

## 2011-12-10 DIAGNOSIS — R112 Nausea with vomiting, unspecified: Secondary | ICD-10-CM

## 2011-12-10 DIAGNOSIS — R51 Headache: Secondary | ICD-10-CM

## 2011-12-10 MED ORDER — ONDANSETRON 4 MG PO TBDP: 4.0000 mg | ORAL_TABLET | Freq: Four times a day (QID) | ORAL | Status: DC | PRN

## 2011-12-10 MED ORDER — ONDANSETRON 4 MG PO TBDP
4.0000 mg | ORAL_TABLET | Freq: Once | ORAL | Status: AC
  Administered 2011-12-10: 4 mg via ORAL

## 2011-12-10 NOTE — Progress Notes (Signed)
Patients [employer] plan has given notification # for CT scan of head,  it is [phone]. She is going now for her scan, to be done at [facility] today [address]

## 2011-12-10 NOTE — Patient Instructions (Addendum)
Go today now for your CT scan, they will call report to [place]. It will be done at [facility] directions to [address] [place], [place], [place] [address] [id number]  - more info    [address]  [place], [place] [address]     1. Head south on [address] toward [address]      0.5 mi    2. Sharp left onto [address]      0.6 mi    3. Turn left onto the [address] ramp      0.2 mi    4. Merge onto [address]      3.0 mi    5. Continue straight to stay on [address]      0.4 mi    6. Slight left to stay on [facility]  Destination will be on the right     1.0 mi     [address] [place], [place] [address]

## 2011-12-10 NOTE — Progress Notes (Signed)
[facility] [address] [place] [address] [phone]  Date:  12/10/2011   Name:  [name]   DOB:  September 13, 1965   MRN:  [id number]  PCP:  Tally Due, MD    Chief Complaint: Nausea, Emesis and Eye Burn   History of Present Illness:  [name] is a 46 y.o. very pleasant female patient who presents with the following:  Ill yesterday with blurred vision in left eye.  Proceeded to develop left hemispheric headache associated with nausea and several bouts of vomiting.  Today developed blurred vision in right eye followed by severe left hemispheric constant headache associated with vomiting about 8 times.  No associated neuro or visual symptoms; double vision, difficulty with speech or facial asymmetry, difficulty with gait balance or coordination.  No antecedent illness or injury.  No prior history of severe headaches or migraines.  Headache yesterday lasted about an hour and she has experienced this headache for 3 hours.  Denies other complaints.    Patient Active Problem List  Diagnosis  . DYSLIPIDEMIA  . CARPAL TUNNEL SYNDROME, BILATERAL  . MITRAL VALVE DISORDERS  . VENTRICULAR TACHYCARDIA  . ABNORMAL HEART RHYTHMS  . GERD  . HIATAL HERNIA  . IBS  . PLANTAR FASCIITIS  . RLQ PAIN    Past Medical History  Diagnosis Date  . Mixed hyperlipidemia   . Insomnia   . Arrhythmia ventricular     s/p ablation 2001  . Menorrhagia   . Metrorrhagia   . Uterine fibroid     small  . Mitral valve disorder   . Ventricular tachycardia   . Chronic RLQ pain   . IBS (irritable bowel syndrome)   . Hiatal hernia   . GERD (gastroesophageal reflux disease)   . Plantar fasciitis   . Carpal tunnel syndrome, bilateral   . Dyslipidemia     Past Surgical History  Procedure Date  . Patella arthroplasty   . Cspine 05/1999    c5-6  . Echocardiogram 2000/2001  . Spine surgery     History  Substance Use Topics  . Smoking status: Never  Smoker   . Smokeless tobacco: Not on file  . Alcohol Use: 2.4 oz/week    4 Glasses of wine per week    Family History  Problem Relation Age of Onset  . Hiatal hernia Mother   . Irritable bowel syndrome Mother   . Hypertension Mother   . Colon cancer Neg Hx   . Cancer Father   . Hypertension Sister     Allergies  Allergen Reactions  . Crestor (Rosuvastatin Calcium)   . Trazodone And Nefazodone   . Verapamil   . Latex Rash    Medication list has been reviewed and updated.  Current Outpatient Prescriptions on File Prior to Visit  Medication Sig Dispense Refill  . ALPRAZolam (XANAX) 1 MG tablet Take 1 tablet (1 mg total) by mouth at bedtime as needed for sleep.  30 tablet  0  . atorvastatin (LIPITOR) 10 MG tablet Take 1 tablet (10 mg total) by mouth daily.  90 tablet  3  . flecainide (TAMBOCOR) 50 MG tablet Take 1 tablet (50 mg total) by mouth 2 (two) times daily.  180 tablet  3  . albuterol (PROVENTIL HFA;VENTOLIN HFA) 108 (90 BASE) MCG/ACT inhaler Inhale 2 puffs into the lungs every 4 (four) hours as needed for wheezing.  1 Inhaler  3  . Multiple Vitamins-Minerals (MULTIVITAMIN WITH MINERALS) tablet Take  1 tablet by mouth daily.        Review of Systems:  As per HPI, otherwise negative.    Physical Examination: Filed Vitals:   12/10/11 1557  BP: 115/77  Pulse: 85  Temp: 98.2 F (36.8 C)  Resp: 17   Filed Vitals:   12/10/11 1557  Height: 5\' 5"  (1.651 m)  Weight: 167 lb (75.751 kg)   Body mass index is 27.79 kg/(m^2). Ideal Body Weight: Weight in (lb) to have BMI = 25: 149.9   GEN: WDWN, moderate distress, Non-toxic, A & O x 3  No rash or sepsis.  Well hydrated.  Fundi benign HEENT: Atraumatic, Normocephalic. Neck supple. No masses, No LAD.  Oropharynx negative Ears and Nose: No external deformity. TM negative CV: RRR, No M/G/R. No JVD. No thrill. No extra heart sounds. PULM: CTA B, no wheezes, crackles, rhonchi. No retractions. No resp. distress. No accessory  muscle use. ABD: S, NT, ND, +BS. No rebound. No HSM. EXTR: No c/c/e NEURO Normal gait.   PRRERLA EOMI  CN 2-12 intact PSYCH: Normally interactive. Conversant. Not depressed or anxious appearing.  Calm demeanor.    Assessment and Plan: Atypical severe headache CT zofran  Carmelina Dane, MD

## 2011-12-15 ENCOUNTER — Telehealth: Payer: Self-pay

## 2011-12-15 MED ORDER — ALPRAZOLAM 1 MG PO TABS: 1.0000 mg | ORAL_TABLET | Freq: Every evening | ORAL | Status: DC | PRN

## 2011-12-15 NOTE — Telephone Encounter (Signed)
Patient request refill of Aprazalam. States pharmacy can not sent refill request.  Also states that she was called about an appt for a CT scan but they could not get her in until January. Was this urgent or just a follow up?  #[phone]

## 2011-12-15 NOTE — Telephone Encounter (Signed)
1. Her pharmacy CAN send the rx request electronically, they just don't want to.  I've authorized/printed it.  2. The CT scan was urgent, and was done 12/10/2011.  It was normal.  (Perhaps we inadvertently ordered it twice? I don't see an outstanding order, not sure what happened there)

## 2011-12-16 NOTE — Telephone Encounter (Signed)
rx called into pharmacy and pt notified of results

## 2012-01-14 ENCOUNTER — Other Ambulatory Visit: Payer: Self-pay | Admitting: Physician Assistant

## 2012-01-14 NOTE — Telephone Encounter (Signed)
PT IS ABOUT OUT OF HER ALPRAZOLAM AND WOULD LIKE TO GET A REFILL BEFORE SHE RUNS OUT PLEASE CALL [phone]

## 2012-01-16 ENCOUNTER — Telehealth: Payer: Self-pay

## 2012-01-16 NOTE — Telephone Encounter (Signed)
At [facility] - she must have an OV before more refills.  Please advice pt of our new controlled substance policy so she can se Dr Perrin Maltese or her PCP.

## 2012-01-16 NOTE — Telephone Encounter (Signed)
Pt said pharmacy faxed over request for her xanex on Wednesday and she has not heard anything from [place] please call [phone]

## 2012-01-16 NOTE — Telephone Encounter (Signed)
She has been advised of our new policy and her need for visit with Dr Perrin Maltese. I have advised her also this is sent in for her.

## 2012-01-16 NOTE — Telephone Encounter (Signed)
Please advise on renewal of Xanax.

## 2012-01-18 NOTE — Telephone Encounter (Signed)
lmom to RTC. 

## 2012-01-18 NOTE — Telephone Encounter (Signed)
RTC and see me 

## 2012-02-12 ENCOUNTER — Encounter: Payer: Self-pay | Admitting: Internal Medicine

## 2012-02-12 ENCOUNTER — Ambulatory Visit (INDEPENDENT_AMBULATORY_CARE_PROVIDER_SITE_OTHER): Payer: 59 | Admitting: Internal Medicine

## 2012-02-12 VITALS — BP 104/68 | HR 80 | Ht 64.0 in | Wt 170.0 lb

## 2012-02-12 DIAGNOSIS — I472 Ventricular tachycardia: Secondary | ICD-10-CM

## 2012-02-12 NOTE — Patient Instructions (Signed)
Your physician wants you to follow-up in: 12 months with Dr. Taylor. You will receive a reminder letter in the mail two months in advance. If you don't receive a letter, please call our office to schedule the follow-up appointment.    

## 2012-02-12 NOTE — Assessment & Plan Note (Signed)
Her ventricular tachycardia remains well controlled. She will continue taking flecainide. I plan to see her back in one year. She is instructed to call us if she has recurrent arrhythmia.

## 2012-02-12 NOTE — Progress Notes (Signed)
HPI [name] returns today for followup. 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 She underwent catheter ablation over 10 years ago. Post procedure, she had residual palpitations due to PVCs and was placed on flecainide. She has done well since then with minimal tachycardia palpitations unless she forgets to take her medications. She denies chest pain, shortness of breath, or syncope. She has had acid reflux for which she takes hydrogen pump blockers. Allergies  Allergen Reactions  . Crestor (Rosuvastatin Calcium)   . Trazodone And Nefazodone   . Verapamil   . Latex Rash     Current Outpatient Prescriptions  Medication Sig Dispense Refill  . albuterol (PROVENTIL HFA;VENTOLIN HFA) 108 (90 BASE) MCG/ACT inhaler Inhale 2 puffs into the lungs every 4 (four) hours as needed for wheezing.  1 Inhaler  3  . ALPRAZolam (XANAX) 1 MG tablet TAKE 1 TABLET AT BEDTIME AS NEEDED FOR SLEEP  30 tablet  0  . atorvastatin (LIPITOR) 10 MG tablet Take 1 tablet (10 mg total) by mouth daily.  90 tablet  3  . flecainide (TAMBOCOR) 50 MG tablet Take 1 tablet (50 mg total) by mouth 2 (two) times daily.  180 tablet  3  . Multiple Vitamins-Minerals (MULTIVITAMIN WITH MINERALS) tablet Take 1 tablet by mouth daily.         Past Medical History  Diagnosis Date  . Mixed hyperlipidemia   . Insomnia   . Arrhythmia ventricular     s/p ablation 2001  . Menorrhagia   . Metrorrhagia   . Uterine fibroid     small  . Mitral valve disorder   . Ventricular tachycardia   . Chronic RLQ pain   . IBS (irritable bowel syndrome)   . Hiatal hernia   . GERD (gastroesophageal reflux disease)   . Plantar fasciitis   . Carpal tunnel syndrome, bilateral   . Dyslipidemia     ROS:   All systems reviewed and negative except as noted in the HPI.   Past Surgical History  Procedure Date  . Patella arthroplasty   . Cspine  05/1999    c5-6  . Echocardiogram 2000/2001  . Spine surgery      Family History  Problem Relation Age of Onset  . Hiatal hernia Mother   . Irritable bowel syndrome Mother   . Hypertension Mother   . Colon cancer Neg Hx   . Cancer Father   . Hypertension Sister      History   Social History  . Marital Status: Married    Spouse Name: N/A    Number of Children: N/A  . Years of Education: N/A   Occupational History  . Not on file.   Social History Main Topics  . Smoking status: Never Smoker   . Smokeless tobacco: Not on file  . Alcohol Use: 2.4 oz/week    4 Glasses of wine per week  . Drug Use: Not on file  . Sexually Active: Yes    Birth Control/ Protection: None   Other Topics Concern  . Not on file   Social History Narrative  . No narrative on file     BP 104/68  Pulse 80  Ht 5\' 4"  (1.626 m)  Wt 170 lb (77.111 kg)  BMI 29.18 kg/m2  SpO2 98%  Physical Exam:  Well appearing middle-aged woman, NAD HEENT: Unremarkable Neck:  No JVD, no thyromegally Lungs:  Clear with no wheezes, rales,  or rhonchi. HEART:  Regular rate rhythm, no murmurs, no rubs, no clicks Abd:  soft, positive bowel sounds, no organomegally, no rebound, no guarding Ext:  2 plus pulses, no edema, no cyanosis, no clubbing Skin:  No rashes no nodules Neuro:  CN II through XII intact, motor grossly intact  EKG normal sinus rhythm, with normal axis and intervals.  Assess/Plan:

## 2012-02-17 ENCOUNTER — Ambulatory Visit (INDEPENDENT_AMBULATORY_CARE_PROVIDER_SITE_OTHER): Payer: 59 | Admitting: Physician Assistant

## 2012-02-17 ENCOUNTER — Encounter: Payer: Self-pay | Admitting: Physician Assistant

## 2012-02-17 VITALS — BP 112/78 | HR 77 | Temp 97.9°F | Resp 16 | Ht 65.0 in | Wt 171.0 lb

## 2012-02-17 DIAGNOSIS — F341 Dysthymic disorder: Secondary | ICD-10-CM

## 2012-02-17 DIAGNOSIS — F418 Other specified anxiety disorders: Secondary | ICD-10-CM

## 2012-02-17 MED ORDER — FLUOXETINE HCL 20 MG PO TABS: 20.0000 mg | ORAL_TABLET | Freq: Every day | ORAL | Status: DC

## 2012-02-17 MED ORDER — ALPRAZOLAM 1 MG PO TABS: ORAL_TABLET | ORAL | Status: DC

## 2012-02-17 NOTE — Progress Notes (Signed)
Subjective:    Patient ID: [name], female    DOB: 1965-06-13, 47 y.o.   MRN: [id number]  HPI  This 47 y.o. female presents for evaluation of insomnia.  Chronic issue.  "My mind races at night."  Already on fluoxetine 20 mg QD.   Past Medical History  Diagnosis Date  . Insomnia   . Arrhythmia ventricular     s/p ablation 2001  . Menorrhagia   . Uterine fibroid     small  . Mitral valve disorder   . Ventricular tachycardia   . Chronic RLQ pain   . IBS (irritable bowel syndrome)   . Hiatal hernia   . GERD (gastroesophageal reflux disease)   . Plantar fasciitis   . Carpal tunnel syndrome, bilateral   . Dyslipidemia     Past Surgical History  Procedure Laterality Date  . Patella arthroplasty    . Cspine  05/1999    c5-6  . Echocardiogram  2000/2001  . Spine surgery      Prior to Admission medications   Medication Sig Start Date End Date Taking? Authorizing Provider  ALPRAZolam Prudy Feeler) 1 MG tablet TAKE 1 TABLET AT BEDTIME AS NEEDED FOR SLEEP 01/14/12  Yes Morrell Riddle, PA-C  atorvastatin (LIPITOR) 10 MG tablet Take 1 tablet (10 mg total) by mouth daily. 06/18/11  Yes Marinus Maw, MD  flecainide (TAMBOCOR) 50 MG tablet Take 1 tablet (50 mg total) by mouth 2 (two) times daily. 06/18/11  Yes Marinus Maw, MD  FLUoxetine (PROZAC) 20 MG tablet Take 20 mg by mouth daily.   Yes Historical Provider, MD    Allergies  Allergen Reactions  . Crestor (Rosuvastatin Calcium)   . Trazodone And Nefazodone   . Verapamil   . Latex Rash    History   Social History  . Marital Status: Married    Spouse Name: [name]    Number of Children: 2  . Years of Education: 16   Occupational History  . Building control surveyor    Social History Main Topics  . Smoking status: Never Smoker   . Smokeless tobacco: Never Used  . Alcohol Use: 2.4 oz/week    4 Glasses of wine per week  . Drug Use: No  . Sexually Active: Yes -- Female partner(s)    Birth Control/ Protection: None   Comment: h/o infertility   Other Topics Concern  . Not on file   Social History Narrative   Lives with her husband ([place] [employer]). Two daughters live with them.  Oldest daughter is grown and lives locally with her husband and child.    Family History  Problem Relation Age of Onset  . Hiatal hernia Mother   . Irritable bowel syndrome Mother   . Hypertension Mother   . Colon cancer Neg Hx   . Cancer Father   . Hypertension Sister    Review of Systems No chest pain, SOB, HA, dizziness, vision change, N/V, diarrhea, constipation, dysuria, urinary urgency or frequency, myalgias, arthralgias or rash.     Objective:   Physical Exam Blood pressure 112/78, pulse 77, temperature 97.9 F (36.6 C), temperature source Oral, resp. rate 16, height 5\' 5"  (1.651 m), weight 171 lb (77.565 kg), last menstrual period 02/03/2012, SpO2 99.00%. Body mass index is 28.46 kg/(m^2). Well-developed, well nourished WF who is awake, alert and oriented, in NAD. HEENT: [place]/AT,  Neck: supple, non-tender, no lymphadenopathy, thyromegaly. Heart: RRR, no murmur Lungs: normal effort, CTA Extremities: no cyanosis, clubbing or  edema. Skin: warm and dry without rash. Psychologic: good mood and appropriate affect, normal speech and behavior.     Assessment & Plan:   1. Depression with anxiety  FLUoxetine (PROZAC) 20 MG tablet   ALPRAZolam (XANAX) 1 MG tablet   Continue current treatment.  Elect not to increase the SSRI dose given her ventricular tachycardia s/p ablation and chronic use of flecainide.  If she notes a need to increase the alprazolam we'll revisit.  Plan follow-up in 06/2012 with fasting labs.

## 2012-02-17 NOTE — Patient Instructions (Signed)
Plan to come in fasting for a "physical" in June.

## 2012-02-19 ENCOUNTER — Ambulatory Visit: Payer: 59 | Admitting: Physician Assistant

## 2012-03-17 ENCOUNTER — Telehealth: Payer: Self-pay

## 2012-03-17 NOTE — Telephone Encounter (Signed)
Patient would like refill of alprazolam.  [facility] [phone]

## 2012-03-18 ENCOUNTER — Telehealth: Payer: Self-pay

## 2012-03-18 DIAGNOSIS — F418 Other specified anxiety disorders: Secondary | ICD-10-CM

## 2012-03-18 NOTE — Telephone Encounter (Signed)
Patient would like refill of alprazolam. Wants to know if this can be filled tomorrow before she leaves out of town. [facility] [phone]

## 2012-03-19 MED ORDER — ALPRAZOLAM 1 MG PO TABS: ORAL_TABLET | ORAL | Status: DC

## 2012-03-19 NOTE — Telephone Encounter (Signed)
See other message.  Rx printed.

## 2012-03-19 NOTE — Telephone Encounter (Signed)
Rx printed

## 2012-03-19 NOTE — Telephone Encounter (Signed)
chelle has done this. Called her to advise.

## 2012-03-31 NOTE — Progress Notes (Signed)
Reviewed and agree.

## 2012-04-08 ENCOUNTER — Other Ambulatory Visit: Payer: Self-pay | Admitting: Internal Medicine

## 2012-04-17 ENCOUNTER — Telehealth: Payer: Self-pay

## 2012-04-17 DIAGNOSIS — F418 Other specified anxiety disorders: Secondary | ICD-10-CM

## 2012-04-17 NOTE — Telephone Encounter (Signed)
Needs refill on xanax (generic). Pharmacy told her that they can not fax this type of rx.  Please call pt when done. [phone]  [facility] [address]

## 2012-04-19 ENCOUNTER — Other Ambulatory Visit: Payer: Self-pay | Admitting: Radiology

## 2012-04-19 MED ORDER — ALPRAZOLAM 1 MG PO TABS: ORAL_TABLET | ORAL | Status: DC

## 2012-04-19 NOTE — Telephone Encounter (Signed)
Rx printed.  The pharmacy should be reminded that they CAN request this medication electronically, which is our preferred method.

## 2012-05-17 ENCOUNTER — Telehealth: Payer: Self-pay

## 2012-05-17 DIAGNOSIS — F418 Other specified anxiety disorders: Secondary | ICD-10-CM

## 2012-05-17 NOTE — Telephone Encounter (Signed)
Pt is needing a refill on alprazalam-pharmacy states they can not request this electronically or over the phone patient has to call   [facility] number [phone]

## 2012-05-18 MED ORDER — ALPRAZOLAM 1 MG PO TABS: ORAL_TABLET | ORAL | Status: DC

## 2012-05-18 NOTE — Telephone Encounter (Signed)
The pharmacy is incorrect.  They CAN request controlled substances electronically.  Please call it in: Meds ordered this encounter  Medications  . ALPRAZolam (XANAX) 1 MG tablet    Sig: TAKE 1 TABLET AT BEDTIME AS NEEDED FOR SLEEP    Dispense:  30 tablet    Refill:  0    Order Specific Question:  Supervising Provider    Answer:  DOOLITTLE, ROBERT P [[id number]]

## 2012-06-16 ENCOUNTER — Telehealth: Payer: Self-pay

## 2012-06-16 DIAGNOSIS — F418 Other specified anxiety disorders: Secondary | ICD-10-CM

## 2012-06-16 MED ORDER — ALPRAZOLAM 1 MG PO TABS: ORAL_TABLET | ORAL | Status: DC

## 2012-06-16 NOTE — Telephone Encounter (Signed)
Pt would like a refill on her alprazolam. [facility]# [phone] [facility] [address]

## 2012-06-16 NOTE — Telephone Encounter (Signed)
Rx printed. Meds ordered this encounter  Medications  . ALPRAZolam (XANAX) 1 MG tablet    Sig: TAKE 1 TABLET AT BEDTIME AS NEEDED FOR SLEEP    Dispense:  30 tablet    Refill:  0    Order Specific Question:  Supervising Provider    Answer:  DOOLITTLE, ROBERT P [[id number]]    

## 2012-06-16 NOTE — Telephone Encounter (Signed)
Called in [facility] to [facility]. Called pt to let her know. LMOM.

## 2012-06-22 ENCOUNTER — Other Ambulatory Visit: Payer: Self-pay | Admitting: Internal Medicine

## 2012-06-22 ENCOUNTER — Ambulatory Visit: Payer: 59 | Admitting: Physician Assistant

## 2012-06-29 NOTE — Telephone Encounter (Signed)
error 

## 2012-07-08 ENCOUNTER — Other Ambulatory Visit: Payer: Self-pay | Admitting: Internal Medicine

## 2012-07-08 NOTE — Telephone Encounter (Signed)
Fax Received. Refill Completed. Saida Lonon Chowoe (R.M.A)   

## 2012-07-13 ENCOUNTER — Telehealth: Payer: Self-pay

## 2012-07-13 ENCOUNTER — Other Ambulatory Visit: Payer: Self-pay

## 2012-07-13 DIAGNOSIS — Z1231 Encounter for screening mammogram for malignant neoplasm of breast: Secondary | ICD-10-CM

## 2012-07-13 DIAGNOSIS — F418 Other specified anxiety disorders: Secondary | ICD-10-CM

## 2012-07-13 MED ORDER — ALPRAZOLAM 1 MG PO TABS: ORAL_TABLET | ORAL | Status: DC

## 2012-07-13 NOTE — Telephone Encounter (Signed)
Patient notified and voiced understanding.

## 2012-07-13 NOTE — Telephone Encounter (Signed)
Rx printed.  She's due for follow-up and fasting labs.  Please advise her.  Meds ordered this encounter  Medications  . ALPRAZolam (XANAX) 1 MG tablet    Sig: TAKE 1 TABLET AT BEDTIME AS NEEDED FOR SLEEP Need office visit for additional refills.    Dispense:  30 tablet    Refill:  0    Order Specific Question:  Supervising Provider    Answer:  DOOLITTLE, ROBERT P [[id number]]

## 2012-07-13 NOTE — Telephone Encounter (Signed)
PT STATES SHE IS OUT OF HER ALPRAZOLAM AND NEED A REFILL. THE PHARMACY TOLD HER SHE HAD TO CALL us PLEASE CALL [phone]

## 2012-07-14 ENCOUNTER — Other Ambulatory Visit: Payer: Self-pay | Admitting: Physician Assistant

## 2012-07-15 ENCOUNTER — Other Ambulatory Visit: Payer: Self-pay | Admitting: Emergency Medicine

## 2012-08-03 ENCOUNTER — Ambulatory Visit
Admission: RE | Admit: 2012-08-03 | Discharge: 2012-08-03 | Disposition: A | Discharge status: Home / Self Care | Payer: 59 | Source: Ambulatory Visit

## 2012-08-03 DIAGNOSIS — Z1231 Encounter for screening mammogram for malignant neoplasm of breast: Secondary | ICD-10-CM

## 2012-08-05 ENCOUNTER — Other Ambulatory Visit: Payer: Self-pay | Admitting: Obstetrics and Gynecology

## 2012-08-05 DIAGNOSIS — R928 Other abnormal and inconclusive findings on diagnostic imaging of breast: Secondary | ICD-10-CM

## 2012-08-14 ENCOUNTER — Ambulatory Visit: Payer: 59 | Admitting: Physician Assistant

## 2012-08-14 DIAGNOSIS — E7800 Pure hypercholesterolemia, unspecified: Secondary | ICD-10-CM

## 2012-08-14 DIAGNOSIS — I472 Ventricular tachycardia: Secondary | ICD-10-CM

## 2012-08-14 LAB — POCT CBC
Hemoglobin: 13.4 g/dL (ref 12.2–16.2)
Lymph, poc: 1.3 (ref 0.6–3.4)
MCHC: 31.7 g/dL — AB (ref 31.8–35.4)
MPV: 7.8 fL (ref 0–99.8)
POC Granulocyte: 3.6 (ref 2–6.9)
POC LYMPH PERCENT: 24.8 %L (ref 10–50)
POC MID %: 8.2 %M (ref 0–12)
RDW, POC: 12.8 %

## 2012-08-14 LAB — COMPREHENSIVE METABOLIC PANEL
ALT: 11 U/L (ref 0–35)
Alkaline Phosphatase: 48 U/L (ref 39–117)
Sodium: 139 mEq/L (ref 135–145)
Total Bilirubin: 0.6 mg/dL (ref 0.3–1.2)
Total Protein: 6.8 g/dL (ref 6.0–8.3)

## 2012-08-14 LAB — LIPID PANEL
Total CHOL/HDL Ratio: 3.7 Ratio
VLDL: 25 mg/dL (ref 0–40)

## 2012-08-14 NOTE — Progress Notes (Signed)
Patient here for lab draw only. She will be following up with Ms. Leotis Shames, PA-C for a "physical." Currently fasting. Will forward labs to treating provider for continuity of care.   Eula Listen, PA-C 08/14/2012 10:06 AM

## 2012-08-16 ENCOUNTER — Ambulatory Visit (INDEPENDENT_AMBULATORY_CARE_PROVIDER_SITE_OTHER): Payer: 59 | Admitting: Physician Assistant

## 2012-08-16 VITALS — BP 118/68 | HR 86 | Temp 98.7°F | Resp 18 | Ht 65.0 in | Wt 170.2 lb

## 2012-08-16 DIAGNOSIS — E7800 Pure hypercholesterolemia, unspecified: Secondary | ICD-10-CM

## 2012-08-16 DIAGNOSIS — F418 Other specified anxiety disorders: Secondary | ICD-10-CM

## 2012-08-16 DIAGNOSIS — Z0000 Encounter for general adult medical examination without abnormal findings: Secondary | ICD-10-CM

## 2012-08-16 DIAGNOSIS — F341 Dysthymic disorder: Secondary | ICD-10-CM

## 2012-08-16 DIAGNOSIS — R51 Headache: Secondary | ICD-10-CM

## 2012-08-16 MED ORDER — IPRATROPIUM BROMIDE 0.03 % NA SOLN: 2.0000 | Freq: Two times a day (BID) | NASAL | Status: DC

## 2012-08-16 MED ORDER — ATORVASTATIN CALCIUM 20 MG PO TABS: ORAL_TABLET | ORAL | Status: DC

## 2012-08-16 MED ORDER — ALPRAZOLAM 1 MG PO TABS: ORAL_TABLET | ORAL | Status: DC

## 2012-08-16 NOTE — Progress Notes (Signed)
Subjective:    Patient ID: [name], female    DOB: 05-23-1965, 47 y.o.   MRN: [id number]  HPI  This 47 y.o. female presents for Wellness Exam.  Past Medical History  Diagnosis Date  . Insomnia   . Arrhythmia ventricular     s/p ablation 2001  . Menorrhagia   . Uterine fibroid     small  . Mitral valve disorder   . Ventricular tachycardia   . Chronic RLQ pain   . IBS (irritable bowel syndrome)   . Hiatal hernia   . GERD (gastroesophageal reflux disease)   . Plantar fasciitis   . Carpal tunnel syndrome, bilateral   . Dyslipidemia     Past Surgical History  Procedure Laterality Date  . Patella arthroplasty    . Cspine  05/1999    c5-6  . Echocardiogram  2000/2001  . Spine surgery      Prior to Admission medications   Medication Sig Start Date End Date Taking? Authorizing Provider  ALPRAZolam (XANAX) 1 MG tablet TAKE 1 TABLET AT BEDTIME AS NEEDED FOR SLEEP Need office visit for additional refills. 07/13/12  Yes Philippe Gang S Faatima Tench, PA-C  atorvastatin (LIPITOR) 10 MG tablet TAKE 1 TABLET BY MOUTH EVERY DAY 06/22/12  Yes Marinus Maw, MD  Estradiol-Norethindrone Acet (MIMVEY LO) 0.5-0.1 MG per tablet Take 1 tablet by mouth daily.   Yes Historical Provider, MD  flecainide (TAMBOCOR) 50 MG tablet TAKE 1 TABLET BY MOUTH TWICE A DAY 07/08/12  Yes Marinus Maw, MD  FLUoxetine (PROZAC) 20 MG tablet TAKE 1 TABLET BY MOUTH EVERY DAY AS DIRECTED 04/08/12  Yes Curt Oatis Tessa Lerner, PA-C  levonorgestrel (MIRENA) 20 MCG/24HR IUD 1 each by Intrauterine route once.    Historical Provider, MD    Allergies  Allergen Reactions  . Crestor (Rosuvastatin Calcium)   . Trazodone And Nefazodone   . Verapamil   . Latex Rash    History   Social History  . Marital Status: Married    Spouse Name: [name]    Number of Children: 2  . Years of Education: 16   Occupational History  . Building control surveyor    Social History Main Topics  . Smoking status: Never Smoker   . Smokeless tobacco:  Never Used  . Alcohol Use: 2.4 oz/week    4 Glasses of wine per week  . Drug Use: No  . Sexually Active: Yes -- Female partner(s)    Birth Control/ Protection: None     Comment: h/o infertility   Other Topics Concern  . Not on file   Social History Narrative   Lives with her husband (2nd marriage, [employer]). Two daughters live with them.  Oldest daughter is grown and lives locally with her husband and child.    Family History  Problem Relation Age of Onset  . Hiatal hernia Mother   . Irritable bowel syndrome Mother   . Hypertension Mother   . Colon cancer Neg Hx   . Cancer Father   . Hypertension Sister      Review of Systems  Constitutional: Negative.   HENT: Negative for hearing loss, ear pain, nosebleeds, congestion, sore throat, facial swelling, rhinorrhea, sneezing, drooling, mouth sores, trouble swallowing, neck pain, neck stiffness, dental problem, voice change, postnasal drip, sinus pressure, tinnitus and ear discharge.   Eyes: Negative.   Respiratory: Negative.   Cardiovascular: Negative.   Gastrointestinal: Negative.   Endocrine: Negative.   Genitourinary: Negative.   Musculoskeletal: Negative.  Skin: Negative.   Allergic/Immunologic: Negative.   Neurological: Positive for headaches (monthly). Negative for dizziness, tremors, seizures, syncope, facial asymmetry, speech difficulty, weakness, light-headedness and numbness.       Thinks they may be hormonally mediated.  Began when she started a hormone replacement treatment for vasomotor symptoms.  Saw her GYN, who did some hormone testing, and changed her to the current product. Earlier this year she had a "bad migraine" and saw Dr. Vickey Huger and a negative CT scan of the brain.  She notes that her HA are frontal, and associated with popping and fullness in her ears, but no sinus congestion/drainage, etc.  Hematological: Negative.   Psychiatric/Behavioral: Negative.        Objective:   Physical Exam  Vitals  reviewed. Constitutional: She is oriented to person, place, and time. Vital signs are normal. She appears well-developed and well-nourished. She is active and cooperative. No distress.  HENT:  Head: Normocephalic and atraumatic.  Right Ear: Hearing, tympanic membrane, external ear and ear canal normal. No foreign bodies.  Left Ear: Hearing, tympanic membrane, external ear and ear canal normal. No foreign bodies.  Nose: Nose normal.  Mouth/Throat: Uvula is midline, oropharynx is clear and moist and mucous membranes are normal. No oral lesions. Normal dentition. No dental abscesses or edematous. No oropharyngeal exudate.  Eyes: Conjunctivae, EOM and lids are normal. Pupils are equal, round, and reactive to light. Right eye exhibits no discharge. Left eye exhibits no discharge. No scleral icterus.  Fundoscopic exam:      The right eye shows no arteriolar narrowing, no AV nicking, no exudate, no hemorrhage and no papilledema. The right eye shows red reflex.       The left eye shows no arteriolar narrowing, no AV nicking, no exudate, no hemorrhage and no papilledema. The left eye shows red reflex.  Neck: Trachea normal, normal range of motion and full passive range of motion without pain. Neck supple. No spinous process tenderness and no muscular tenderness present. No mass and no thyromegaly present.  Cardiovascular: Normal rate, regular rhythm, normal heart sounds, intact distal pulses and normal pulses.   Pulmonary/Chest: Effort normal and breath sounds normal.  Musculoskeletal: She exhibits no edema and no tenderness.       Cervical back: Normal.       Thoracic back: Normal.       Lumbar back: Normal.  Lymphadenopathy:       Head (right side): No tonsillar, no preauricular, no posterior auricular and no occipital adenopathy present.       Head (left side): No tonsillar, no preauricular, no posterior auricular and no occipital adenopathy present.    She has no cervical adenopathy.       Right:  No supraclavicular adenopathy present.       Left: No supraclavicular adenopathy present.  Neurological: She is alert and oriented to person, place, and time. She has normal strength and normal reflexes. No cranial nerve deficit. She exhibits normal muscle tone. Coordination and gait normal.  Skin: Skin is warm, dry and intact. No rash noted. She is not diaphoretic. No cyanosis or erythema. Nails show no clubbing.  Psychiatric: She has a normal mood and affect. Her speech is normal and behavior is normal. Judgment and thought content normal.    Results for orders placed in visit on 08/14/12  COMPREHENSIVE METABOLIC PANEL      Result Value Range   Sodium 139  135 - 145 mEq/L   Potassium 4.9  3.5 - 5.3  mEq/L   Chloride 104  96 - 112 mEq/L   CO2 29  19 - 32 mEq/L   Glucose, Bld 97  70 - 99 mg/dL   BUN 14  6 - 23 mg/dL   Creat 1.61  0.96 - 0.45 mg/dL   Total Bilirubin 0.6  0.3 - 1.2 mg/dL   Alkaline Phosphatase 48  39 - 117 U/L   AST 15  0 - 37 U/L   ALT 11  0 - 35 U/L   Total Protein 6.8  6.0 - 8.3 g/dL   Albumin 4.4  3.5 - 5.2 g/dL   Calcium 9.2  8.4 - 40.9 mg/dL  TSH      Result Value Range   TSH 2.879  0.350 - 4.500 uIU/mL  LIPID PANEL      Result Value Range   Cholesterol 221 (*) 0 - 200 mg/dL   Triglycerides 811  <914 mg/dL   HDL 59  >78 mg/dL   Total CHOL/HDL Ratio 3.7     VLDL 25  0 - 40 mg/dL   LDL Cholesterol 295 (*) 0 - 99 mg/dL  POCT CBC      Result Value Range   WBC 5.3  4.6 - 10.2 K/uL   Lymph, poc 1.3  0.6 - 3.4   POC LYMPH PERCENT 24.8  10 - 50 %L   MID (cbc) 0.4  0 - 0.9   POC MID % 8.2  0 - 12 %M   POC Granulocyte 3.6  2 - 6.9   Granulocyte percent 67.0  37 - 80 %G   RBC 4.26  4.04 - 5.48 M/uL   Hemoglobin 13.4  12.2 - 16.2 g/dL   HCT, POC 62.1  30.8 - 47.9 %   MCV 99.4 (*) 80 - 97 fL   MCH, POC 31.5 (*) 27 - 31.2 pg   MCHC 31.7 (*) 31.8 - 35.4 g/dL   RDW, POC 65.7     Platelet Count, POC 198  142 - 424 K/uL   MPV 7.8  0 - 99.8 fL          Assessment & Plan:  Routine general medical examination at a health care facility - Age appropriate anticipatory guidance provided.  Hypercholesterolemia - Plan: INCREASE atorvastatin (LIPITOR) 20 MG tablet  Depression with anxiety - Plan: ALPRAZolam (XANAX) 1 MG tablet  Headache(784.0) - Plan: ipratropium (ATROVENT) 0.03 % nasal spray  Patient Instructions  Keep a diary/log of your headaches, along with your sleep and menstrual details. If you identify a relationship between your headaches and your cycle, let Dr. Billy Coast know.  If not, let's get you back to see Dr. Vickey Huger.  [facility] Policy for Prescribing Controlled Substances (Revised 11/2011) 1. Prescriptions for controlled substances will be filled by ONE provider at [facility] with whom you have established and developed a plan for your care, including follow-up. 2. You are encouraged to schedule an appointment with your prescriber at our appointment center for follow-up visits whenever possible. 3. If you request a prescription for the controlled substance while at [facility] for an acute problem (with someone other than your regular prescriber), you MAY be given a ONE-TIME prescription for a 30-day supply of the controlled substance, to allow time for you to return to see your regular prescriber for additional prescriptions.   Fernande Bras, PA-C Physician Assistant-Certified [facility]

## 2012-08-16 NOTE — Patient Instructions (Addendum)
Keeping You Healthy  Get These Tests 1. Blood Pressure- Have your blood pressure checked once a year by your health care provider.  Normal blood pressure is 120/80. 2. Weight- Have your body mass index (BMI) calculated to screen for obesity.  BMI is measure of body fat based on height and weight.  You can also calculate your own BMI at https://www.west-esparza.com/. 3. Cholesterol- Have your cholesterol checked every 5 years starting at age 49 then yearly starting at age 38. 4. Chlamydia, HIV, and other sexually transmitted diseases- Get screened every year until age 62, then within three months of each new sexual provider. 5. Pap Smear- Every 1-3 years; discuss with your health care provider. 6. Mammogram- Every year starting at age 27  Take these medicines  Calcium with Vitamin D-Your body needs 1200 mg of Calcium each day and [phone] IU of Vitamin D daily.  Your body can only absorb 500 mg of Calcium at a time so Calcium must be taken in 2 or 3 divided doses throughout the day.  Multivitamin with folic acid- Once daily if it is possible for you to become pregnant.  Get these Immunizations  Gardasil-Series of three doses; prevents HPV related illness such as genital warts and cervical cancer.  Menactra-Single dose; prevents meningitis.  Tetanus shot- Every 10 years.  Flu shot-Every year.  Take these steps 1. Do not smoke-Your healthcare provider can help you quit.  For tips on how to quit go to www.smokefree.gov or call 1-800 QUITNOW. 2. Be physically active- Exercise 5 days a week for at least 30 minutes.  If you are not already physically active, start slow and gradually work up to 30 minutes of moderate physical activity.  Examples of moderate activity include walking briskly, dancing, swimming, bicycling, etc. 3. Breast Cancer- A self breast exam every month is important for early detection of breast cancer.  For more information and instruction on self breast exams, ask your  healthcare provider or [email or web address]. 4. Eat a healthy diet- Eat a variety of healthy foods such as fruits, vegetables, whole grains, low fat milk, low fat cheeses, yogurt, lean meats, poultry and fish, beans, nuts, tofu, etc.  For more information go to www. Thenutritionsource.org 5. Drink alcohol in moderation- Limit alcohol intake to one drink or less per day. Never drink and drive. 6. Depression- Your emotional health is as important as your physical health.  If you're feeling down or losing interest in things you normally enjoy please talk to your healthcare provider about being screened for depression. 7. Dental visit- Brush and floss your teeth twice daily; visit your dentist twice a year. 8. Eye doctor- Get an eye exam at least every 2 years. 9. Helmet use- Always wear a helmet when riding a bicycle, motorcycle, rollerblading or skateboarding. 10. Safe sex- If you may be exposed to sexually transmitted infections, use a condom. 11. Seat belts- Seat belts can save your live; always wear one. 12. Smoke/Carbon Monoxide detectors- These detectors need to be installed on the appropriate level of your home. Replace batteries at least once a year. 13. Skin cancer- When out in the sun please cover up and use sunscreen 15 SPF or higher. 14. Violence- If anyone is threatening or hurting you, please tell your healthcare provider.  Keep a diary/log of your headaches, along with your sleep and menstrual details. If you identify a relationship between your headaches and your cycle, let Dr. Billy Coast know.  If not, let's get you back to see Dr. Vickey Huger.  [facility] Policy for Prescribing Controlled Substances (Revised 11/2011) 1. Prescriptions for controlled substances will be filled by ONE provider at [facility] with whom you have established and developed a plan for your care, including follow-up. 2. You are encouraged to schedule an appointment with your prescriber at our appointment  center for follow-up visits whenever possible. 3. If you request a prescription for the controlled substance while at [facility] for an acute problem (with someone other than your regular prescriber), you MAY be given a ONE-TIME prescription for a 30-day supply of the controlled substance, to allow time for you to return to see your regular prescriber for additional prescriptions.

## 2012-08-17 ENCOUNTER — Other Ambulatory Visit: Payer: 59

## 2012-08-17 ENCOUNTER — Ambulatory Visit
Admission: RE | Admit: 2012-08-17 | Discharge: 2012-08-17 | Disposition: A | Discharge status: Home / Self Care | Payer: 59 | Source: Ambulatory Visit | Attending: Obstetrics and Gynecology | Admitting: Obstetrics and Gynecology

## 2012-08-17 DIAGNOSIS — R928 Other abnormal and inconclusive findings on diagnostic imaging of breast: Secondary | ICD-10-CM

## 2012-09-14 ENCOUNTER — Other Ambulatory Visit: Payer: Self-pay | Admitting: Physician Assistant

## 2012-09-14 ENCOUNTER — Telehealth: Payer: Self-pay | Admitting: Family Medicine

## 2012-09-14 DIAGNOSIS — F418 Other specified anxiety disorders: Secondary | ICD-10-CM

## 2012-09-14 MED ORDER — ALPRAZOLAM 1 MG PO TABS: ORAL_TABLET | ORAL | Status: DC

## 2012-09-14 NOTE — Telephone Encounter (Signed)
Faxed xanax Rx to [facility], [address].

## 2012-10-12 ENCOUNTER — Other Ambulatory Visit: Payer: Self-pay | Admitting: Physician Assistant

## 2012-10-12 DIAGNOSIS — F418 Other specified anxiety disorders: Secondary | ICD-10-CM

## 2012-10-13 ENCOUNTER — Other Ambulatory Visit: Payer: Self-pay | Admitting: Radiology

## 2012-10-13 MED ORDER — ALPRAZOLAM 1 MG PO TABS: ORAL_TABLET | ORAL | Status: DC

## 2012-10-18 ENCOUNTER — Ambulatory Visit (INDEPENDENT_AMBULATORY_CARE_PROVIDER_SITE_OTHER): Payer: 59

## 2012-10-18 DIAGNOSIS — Z23 Encounter for immunization: Secondary | ICD-10-CM

## 2012-11-10 ENCOUNTER — Other Ambulatory Visit: Payer: Self-pay | Admitting: Physician Assistant

## 2012-11-10 DIAGNOSIS — F418 Other specified anxiety disorders: Secondary | ICD-10-CM

## 2012-11-11 MED ORDER — ALPRAZOLAM 1 MG PO TABS: ORAL_TABLET | ORAL | Status: DC

## 2012-12-08 ENCOUNTER — Telehealth: Payer: Self-pay | Admitting: Physician Assistant

## 2012-12-08 DIAGNOSIS — F418 Other specified anxiety disorders: Secondary | ICD-10-CM

## 2012-12-08 MED ORDER — ALPRAZOLAM 1 MG PO TABS: ORAL_TABLET | ORAL | Status: DC

## 2012-12-08 NOTE — Telephone Encounter (Signed)
Rx faxed

## 2012-12-08 NOTE — Telephone Encounter (Signed)
Rx printed. Meds ordered this encounter  Medications  . ALPRAZolam (XANAX) 1 MG tablet    Sig: TAKE 1 TABLET AT BEDTIME AS NEEDED FOR SLEEP    Dispense:  30 tablet    Refill:  0    Order Specific Question:  Supervising Provider    Answer:  DOOLITTLE, ROBERT P [[id number]]    

## 2012-12-26 ENCOUNTER — Encounter: Payer: Self-pay | Admitting: Physician Assistant

## 2012-12-27 ENCOUNTER — Ambulatory Visit: Payer: 59 | Admitting: Physician Assistant

## 2012-12-27 VITALS — BP 132/78 | HR 90 | Temp 97.6°F | Resp 16 | Ht 65.5 in | Wt 175.8 lb

## 2012-12-27 DIAGNOSIS — N3289 Other specified disorders of bladder: Secondary | ICD-10-CM

## 2012-12-27 DIAGNOSIS — R3989 Other symptoms and signs involving the genitourinary system: Secondary | ICD-10-CM

## 2012-12-27 DIAGNOSIS — J329 Chronic sinusitis, unspecified: Secondary | ICD-10-CM

## 2012-12-27 LAB — POCT URINALYSIS DIPSTICK
Bilirubin, UA: NEGATIVE
Glucose, UA: NEGATIVE
Ketones, UA: NEGATIVE
Nitrite, UA: NEGATIVE

## 2012-12-27 LAB — POCT UA - MICROSCOPIC ONLY

## 2012-12-27 MED ORDER — LEVOFLOXACIN 500 MG PO TABS: 500.0000 mg | ORAL_TABLET | Freq: Every day | ORAL | Status: AC

## 2012-12-27 MED ORDER — HYDROCOD POLST-CHLORPHEN POLST 10-8 MG/5ML PO LQCR: 5.0000 mL | Freq: Two times a day (BID) | ORAL | Status: DC | PRN

## 2012-12-27 NOTE — Telephone Encounter (Signed)
Patient has another bladder infection.  Needs antibiotics.    Used the home test [address] [facility]   [phone]

## 2012-12-27 NOTE — Progress Notes (Signed)
Subjective:    Patient ID: [name], female    DOB: 1965-10-01, 47 y.o.   MRN: [id number]  PCP: Tally Due, MD  Chief Complaint  Patient presents with  . bladder pressure    Saturday night, woke pt. from sleep  . Hematuria  . head congestion  . Sore Throat    2 1/2 weeks, no fever   Medications, allergies, past medical history, surgical history, family history, social history and problem list reviewed and updated.  HPI  Seems like previous UTI, though she usually has back pain and doesn't with this one. Pressure in the low pelvis, pain at the end of the urine stream.  Some hematuria. Minimal urgency, frequency.  No vaginal discharge, itching or burning. Using OTC AZO.  Also, has congestion and drainage and ST x 2.5 weeks.  Generally not feeling well. No fever.  No nausea, vomiting or diarrhea. Her family members had these symptoms, and are all well now. Using Atrovent NS.   Review of Systems As above.  No CP, SOB, HA, Dizziness, nausea/vomiting.    Objective:   Physical Exam Blood pressure 132/78, pulse 90, temperature 97.6 F (36.4 C), temperature source Oral, resp. rate 16, height 5' 5.5" (1.664 m), weight 175 lb 12.8 oz (79.742 kg), last menstrual period 11/28/2012, SpO2 97.00%. Body mass index is 28.8 kg/(m^2). Well-developed, well nourished WF who is awake, alert and oriented, in NAD. HEENT: [place]/AT, PERRL, EOMI.  Sclera and conjunctiva are clear.  EAC are patent, TMs are normal in appearance. Nasal mucosa is pink and moist. OP is clear. No frontal or maxillary sinus tenderness. Neck: supple, non-tender, no lymphadenopathy, thyromegaly. Heart: RRR, no murmur Lungs: normal effort, CTA Abdomen: normo-active bowel sounds, supple, no mass or organomegaly. "pressure" discomfort with palpation of the suprapubic area. No CVA tenderness. Extremities: no cyanosis, clubbing or edema. Skin: warm and dry without rash. Psychologic: good mood and appropriate affect,  normal speech and behavior.    Results for orders placed in visit on 12/27/12  POCT URINALYSIS DIPSTICK      Result Value Range   Color, UA yellow     Clarity, UA clear     Glucose, UA neg     Bilirubin, UA neg     Ketones, UA neg     Spec Grav, UA <=1.005     Blood, UA moderate     pH, UA 6.0     Protein, UA neg     Urobilinogen, UA 0.2     Nitrite, UA neg     Leukocytes, UA small (1+)    POCT UA - MICROSCOPIC ONLY      Result Value Range   WBC, Ur, HPF, POC 10-20     RBC, urine, microscopic 2-4     Bacteria, U Microscopic trace     Mucus, UA neg     Epithelial cells, urine per micros 2-3     Crystals, Ur, HPF, POC neg     Casts, Ur, LPF, POC neg     Yeast, UA neg         Assessment & Plan:  1. Sensation of pressure in bladder area Continue OTC AZO.  Levaquin for sinusitis should cover UTI. - POCT urinalysis dipstick - POCT UA - Microscopic Only - Urine culture  2. Sinusitis Select Levaquin so as to also cover for suspected UTI.  Continue atrovent NS. - levofloxacin (LEVAQUIN) 500 MG tablet; Take 1 tablet (500 mg total) by mouth daily.  Dispense:  10 tablet; Refill: 0 - chlorpheniramine-HYDROcodone (TUSSIONEX PENNKINETIC ER) 10-8 MG/5ML LQCR; Take 5 mLs by mouth every 12 (twelve) hours as needed for cough (cough).  Dispense: 100 mL; Refill: 0   Fernande Bras, PA-C Physician Assistant-Certified [facility]

## 2012-12-27 NOTE — Patient Instructions (Signed)
Continue to use the nasal spray and the Azo.  I will contact you with your lab results as soon as they are available.   If you have not heard from me in 2 weeks, please contact me.  The fastest way to get your results is to register for My Chart (see the instructions on the last page of this printout).

## 2012-12-30 LAB — URINE CULTURE: Colony Count: 75000

## 2013-01-04 ENCOUNTER — Telehealth: Payer: Self-pay | Admitting: Physician Assistant

## 2013-01-04 DIAGNOSIS — F418 Other specified anxiety disorders: Secondary | ICD-10-CM

## 2013-01-04 DIAGNOSIS — J329 Chronic sinusitis, unspecified: Secondary | ICD-10-CM

## 2013-01-07 MED ORDER — ALPRAZOLAM 1 MG PO TABS
ORAL_TABLET | ORAL | Status: DC
Start: 2013-01-07 — End: 2013-02-04

## 2013-01-07 MED ORDER — ALPRAZOLAM 1 MG PO TABS: ORAL_TABLET | ORAL | Status: DC

## 2013-01-07 MED ORDER — HYDROCOD POLST-CHLORPHEN POLST 10-8 MG/5ML PO LQCR: 5.0000 mL | Freq: Two times a day (BID) | ORAL | Status: DC | PRN

## 2013-01-07 NOTE — Telephone Encounter (Signed)
If these didn't print, please call in the Alprazolam, and ask that one of my colleagues reprint the Tussionex. I've notified the patient by MyChart:  [name] authorized the refills.  You'll have go go pick up the Tussionex (cough medication) prescription-it cannot be called/faxed in.  Warmly, Jaydan Meidinger   Meds ordered this encounter  Medications  . ALPRAZolam (XANAX) 1 MG tablet    Sig: TAKE 1 TABLET AT BEDTIME AS NEEDED FOR SLEEP    Dispense:  30 tablet    Refill:  0    Order Specific Question:  Supervising Provider    Answer:  DOOLITTLE, ROBERT P [[id number]]  . chlorpheniramine-HYDROcodone (TUSSIONEX PENNKINETIC ER) 10-8 MG/5ML LQCR    Sig: Take 5 mLs by mouth every 12 (twelve) hours as needed for cough (cough).    Dispense:  100 mL    Refill:  0    Order Specific Question:  Supervising Provider    Answer:  DOOLITTLE, ROBERT P [[id number]]

## 2013-01-07 NOTE — Telephone Encounter (Signed)
Thanks reprinted, had [name] sign They did print/ both were shredded.

## 2013-02-03 ENCOUNTER — Other Ambulatory Visit: Payer: Self-pay | Admitting: Physician Assistant

## 2013-02-03 DIAGNOSIS — F418 Other specified anxiety disorders: Secondary | ICD-10-CM

## 2013-02-05 ENCOUNTER — Other Ambulatory Visit: Payer: Self-pay | Admitting: Physician Assistant

## 2013-02-05 DIAGNOSIS — F418 Other specified anxiety disorders: Secondary | ICD-10-CM

## 2013-02-05 NOTE — Telephone Encounter (Signed)
Patient calling because she needs a refill for her xanax and she submitted the refill requests through [facility] and it went [facility] instead of [employer]. Please call when ready for pick up. Thank you!  Best: [phone]

## 2013-02-06 ENCOUNTER — Telehealth: Payer: Self-pay

## 2013-02-06 NOTE — Telephone Encounter (Signed)
Patient says she needs her xanax before she leaves out of town today at [employer]. Since [facility] isn't here can someone else approve refill? Patient was wanting to speak to someone clinical because she sent a request through mychart on Thursday and has not heard back from someone.   Best: [phone]- cell phone

## 2013-02-06 NOTE — Telephone Encounter (Signed)
Called in Rx, pt notified.

## 2013-02-07 MED ORDER — ALPRAZOLAM 1 MG PO TABS: ORAL_TABLET | ORAL | Status: DC

## 2013-02-07 NOTE — Telephone Encounter (Signed)
Rx printed. Meds ordered this encounter  Medications  . ALPRAZolam (XANAX) 1 MG tablet    Sig: TAKE 1 TABLET AT BEDTIME AS NEEDED FOR SLEEP    Dispense:  30 tablet    Refill:  0    Order Specific Question:  Supervising Provider    Answer:  DOOLITTLE, ROBERT P [[id number]]

## 2013-02-27 ENCOUNTER — Other Ambulatory Visit: Payer: Self-pay | Admitting: Physician Assistant

## 2013-02-27 DIAGNOSIS — F418 Other specified anxiety disorders: Secondary | ICD-10-CM

## 2013-03-01 ENCOUNTER — Other Ambulatory Visit: Payer: Self-pay | Admitting: Physician Assistant

## 2013-03-01 MED ORDER — ALPRAZOLAM 1 MG PO TABS: ORAL_TABLET | ORAL | Status: DC

## 2013-03-02 NOTE — Telephone Encounter (Signed)
faxed

## 2013-03-26 ENCOUNTER — Other Ambulatory Visit: Payer: Self-pay | Admitting: Physician Assistant

## 2013-03-26 DIAGNOSIS — F418 Other specified anxiety disorders: Secondary | ICD-10-CM

## 2013-03-28 ENCOUNTER — Other Ambulatory Visit: Payer: Self-pay | Admitting: Internal Medicine

## 2013-03-29 MED ORDER — ALPRAZOLAM 1 MG PO TABS: ORAL_TABLET | ORAL | Status: DC

## 2013-03-29 NOTE — Telephone Encounter (Signed)
Faxed

## 2013-04-04 ENCOUNTER — Other Ambulatory Visit: Payer: Self-pay | Admitting: Physician Assistant

## 2013-04-19 ENCOUNTER — Other Ambulatory Visit: Payer: Self-pay | Admitting: Obstetrics and Gynecology

## 2013-04-19 ENCOUNTER — Other Ambulatory Visit: Payer: Self-pay | Admitting: *Deleted

## 2013-04-19 DIAGNOSIS — N6320 Unspecified lump in the left breast, unspecified quadrant: Secondary | ICD-10-CM

## 2013-04-25 ENCOUNTER — Other Ambulatory Visit: Payer: Self-pay | Admitting: Physician Assistant

## 2013-04-25 DIAGNOSIS — F418 Other specified anxiety disorders: Secondary | ICD-10-CM

## 2013-04-26 MED ORDER — ALPRAZOLAM 1 MG PO TABS: ORAL_TABLET | ORAL | Status: DC

## 2013-05-03 ENCOUNTER — Ambulatory Visit
Admission: RE | Admit: 2013-05-03 | Discharge: 2013-05-03 | Disposition: A | Discharge status: Home / Self Care | Payer: 59 | Source: Ambulatory Visit | Attending: Obstetrics and Gynecology | Admitting: Obstetrics and Gynecology

## 2013-05-03 DIAGNOSIS — N6320 Unspecified lump in the left breast, unspecified quadrant: Secondary | ICD-10-CM

## 2013-05-25 ENCOUNTER — Other Ambulatory Visit: Payer: Self-pay | Admitting: Physician Assistant

## 2013-05-25 DIAGNOSIS — F418 Other specified anxiety disorders: Secondary | ICD-10-CM

## 2013-05-25 MED ORDER — ALPRAZOLAM 1 MG PO TABS: ORAL_TABLET | ORAL | Status: DC

## 2013-05-25 NOTE — Telephone Encounter (Signed)
Rx printed. Patient notified by My Chart.  Meds ordered this encounter  Medications  . ALPRAZolam (XANAX) 1 MG tablet    Sig: TAKE 1 TABLET AT BEDTIME AS NEEDED FOR SLEEP    Dispense:  30 tablet    Refill:  0    Order Specific Question:  Supervising Provider    Answer:  DOOLITTLE, ROBERT P [[id number]]

## 2013-05-26 ENCOUNTER — Other Ambulatory Visit: Payer: Self-pay | Admitting: Physician Assistant

## 2013-05-26 ENCOUNTER — Other Ambulatory Visit: Payer: Self-pay | Admitting: Internal Medicine

## 2013-06-05 ENCOUNTER — Other Ambulatory Visit: Payer: Self-pay | Admitting: Physician Assistant

## 2013-06-08 ENCOUNTER — Other Ambulatory Visit: Payer: Self-pay | Admitting: Physician Assistant

## 2013-06-15 ENCOUNTER — Telehealth: Payer: Self-pay

## 2013-06-15 NOTE — Telephone Encounter (Signed)
Pt would like a refill on the FLUoxetine, she will be coming in to

## 2013-06-22 ENCOUNTER — Telehealth: Payer: Self-pay | Admitting: Physician Assistant

## 2013-06-22 DIAGNOSIS — F418 Other specified anxiety disorders: Secondary | ICD-10-CM

## 2013-06-23 ENCOUNTER — Telehealth: Payer: Self-pay | Admitting: Physician Assistant

## 2013-06-23 MED ORDER — ALPRAZOLAM 1 MG PO TABS: ORAL_TABLET | ORAL | Status: DC

## 2013-06-23 NOTE — Telephone Encounter (Signed)
Notified pt on MyChart [name]'s new script sent.

## 2013-06-23 NOTE — Telephone Encounter (Signed)
Patient notified via My Chart. Rx printed.  Meds ordered this encounter  Medications  . ALPRAZolam (XANAX) 1 MG tablet    Sig: TAKE 1 TABLET AT BEDTIME AS NEEDED FOR SLEEP    Dispense:  30 tablet    Refill:  0    Ok for early fill, as the patient will be traveling.    Order Specific Question:  Supervising Provider    Answer:  Tami Lin P [[id number]]

## 2013-06-23 NOTE — Telephone Encounter (Signed)
So sorry I missed that second part about Apolonio Schneiders.  Let's try an alternative.  Mircette. It has a different progesterone in it, still low dose.  I'll send it in.

## 2013-06-23 NOTE — Telephone Encounter (Signed)
Pharmacy called. Pt called to request refill authorization on Prozac. Pt was given a 2nd notice that she needed to follow up with you for further refills. Pt is going out of town and is completely out. Please advise if we can send in a refill for this medication also.   Since pharmacy was on the phone I have called in the Xanax as listed below. Called pt- LM to notify her that it was called in and asking her to call back to schedule an appt with you.

## 2013-06-24 ENCOUNTER — Other Ambulatory Visit: Payer: Self-pay | Admitting: Internal Medicine

## 2013-06-24 NOTE — Telephone Encounter (Signed)
Patient calling to get a refill on her fluoxetine (not sure if spelt correctly) she says she has been taking for 7 for seven years and its get refilled annually for her cpe in august- says she cant come in [place] before her cpe. The pharmacist told her she needs an office visit to get refilled. Patient wants this refilled without an office visit. Because she has been taking this medication for 7 years here without one. Please advise. Patient is completely out of medication. I offered her our hours this weekend to come and be seen to get refilled. Patients wants to try and get this refilled without an ov. She understood.  Best: [phone]

## 2013-06-25 ENCOUNTER — Other Ambulatory Visit: Payer: Self-pay | Admitting: Physician Assistant

## 2013-06-25 NOTE — Telephone Encounter (Signed)
Pt called to request refill authorization on Prozac. Pt was given a 2nd notice that she needed to follow up with you for further refills. Pt is going out of town and is completely out.

## 2013-06-25 NOTE — Telephone Encounter (Signed)
PATIENT WOULD LIKE TO BE CALLED BACK IN REGARDS TO MEDICATION REFILL (FLUOXITINE??). PHONE: [phone]

## 2013-06-26 MED ORDER — FLUOXETINE HCL 20 MG PO TABS: ORAL_TABLET | ORAL | Status: DC

## 2013-07-21 ENCOUNTER — Other Ambulatory Visit: Payer: Self-pay | Admitting: Internal Medicine

## 2013-07-24 ENCOUNTER — Telehealth: Payer: Self-pay | Admitting: Physician Assistant

## 2013-07-24 DIAGNOSIS — F418 Other specified anxiety disorders: Secondary | ICD-10-CM

## 2013-07-25 MED ORDER — ALPRAZOLAM 1 MG PO TABS: ORAL_TABLET | ORAL | Status: DC

## 2013-07-25 NOTE — Telephone Encounter (Signed)
faxed

## 2013-07-25 NOTE — Telephone Encounter (Signed)
Patient notified via My Chart.  Meds ordered this encounter  Medications  . ALPRAZolam (XANAX) 1 MG tablet    Sig: TAKE 1 TABLET AT BEDTIME AS NEEDED FOR SLEEP    Dispense:  30 tablet    Refill:  0    Ok for early fill, as the patient will be traveling.    Order Specific Question:  Supervising Provider    Answer:  Tami Lin P [[id number]]

## 2013-08-09 ENCOUNTER — Other Ambulatory Visit: Payer: Self-pay | Admitting: Obstetrics and Gynecology

## 2013-08-09 DIAGNOSIS — N6489 Other specified disorders of breast: Secondary | ICD-10-CM

## 2013-08-15 ENCOUNTER — Encounter: Payer: Self-pay | Admitting: *Deleted

## 2013-08-16 ENCOUNTER — Ambulatory Visit
Admission: RE | Admit: 2013-08-16 | Discharge: 2013-08-16 | Disposition: A | Discharge status: Home / Self Care | Payer: 59 | Source: Ambulatory Visit | Attending: Obstetrics and Gynecology | Admitting: Obstetrics and Gynecology

## 2013-08-16 ENCOUNTER — Ambulatory Visit: Payer: 59 | Admitting: Neurology

## 2013-08-16 DIAGNOSIS — N6489 Other specified disorders of breast: Secondary | ICD-10-CM

## 2013-08-21 ENCOUNTER — Other Ambulatory Visit: Payer: Self-pay | Admitting: Internal Medicine

## 2013-08-22 ENCOUNTER — Other Ambulatory Visit: Payer: Self-pay | Admitting: Physician Assistant

## 2013-08-22 DIAGNOSIS — F418 Other specified anxiety disorders: Secondary | ICD-10-CM

## 2013-08-22 MED ORDER — ALPRAZOLAM 1 MG PO TABS: ORAL_TABLET | ORAL | Status: DC

## 2013-08-22 NOTE — Telephone Encounter (Signed)
Faxed and pt notified on MyChart 

## 2013-09-07 ENCOUNTER — Encounter: Payer: Self-pay | Admitting: Internal Medicine

## 2013-09-07 ENCOUNTER — Ambulatory Visit (INDEPENDENT_AMBULATORY_CARE_PROVIDER_SITE_OTHER): Payer: 59 | Admitting: Internal Medicine

## 2013-09-07 VITALS — BP 128/64 | HR 79 | Ht 65.5 in | Wt 186.0 lb

## 2013-09-07 DIAGNOSIS — Z6829 Body mass index (BMI) 29.0-29.9, adult: Secondary | ICD-10-CM | POA: Insufficient documentation

## 2013-09-07 DIAGNOSIS — I472 Ventricular tachycardia, unspecified: Secondary | ICD-10-CM

## 2013-09-07 DIAGNOSIS — I4729 Other ventricular tachycardia: Secondary | ICD-10-CM

## 2013-09-07 MED ORDER — FLECAINIDE ACETATE 50 MG PO TABS: 50.0000 mg | ORAL_TABLET | Freq: Two times a day (BID) | ORAL | Status: DC

## 2013-09-07 NOTE — Assessment & Plan Note (Signed)
Her symptoms are well controlled. She will continue her flecainide.

## 2013-09-07 NOTE — Patient Instructions (Signed)
Your physician recommends that you continue on your current medications as directed. Please refer to the Current Medication list given to you today.  Your physician wants you to follow-up in: 2 years with Dr. Lovena Le.  You will receive a reminder letter in the mail two months in advance. If you don't receive a letter, please call our office to schedule the follow-up appointment.

## 2013-09-07 NOTE — Progress Notes (Signed)
HPI [name] returns today for followup. 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 She underwent catheter ablation over 10 years ago. Post procedure, she had residual palpitations due to PVCs and was placed on flecainide. She has done well since then with minimal tachy palpitations unless she forgets to take her medications. She denies chest pain, shortness of breath, or syncope. She has had acid reflux for which she takes hydrogen pump blockers. She continues to gain weight. She is over 50 lbs heavier than she was when I first met her 14 years ago.  Allergies  Allergen Reactions  . Crestor [Rosuvastatin Calcium]   . Verapamil      Current Outpatient Prescriptions  Medication Sig Dispense Refill  . ALPRAZolam (XANAX) 1 MG tablet TAKE 1 TABLET AT BEDTIME AS NEEDED FOR SLEEP  30 tablet  0  . atorvastatin (LIPITOR) 20 MG tablet TAKE 1 TABLET BY MOUTH EVERY DAY  90 tablet  3  . diclofenac (VOLTAREN) 75 MG EC tablet Take 75 mg by mouth as needed.      . flecainide (TAMBOCOR) 50 MG tablet TAKE 1 TABLET BY MOUTH TWICE A DAY **NEEDS OV**  60 tablet  0  . ipratropium (ATROVENT) 0.03 % nasal spray SPRAY TWICE IN EACH NOSTRIL TWICE A DAY  30 mL  3   No current facility-administered medications for this visit.     Past Medical History  Diagnosis Date  . Insomnia   . Arrhythmia ventricular     s/p ablation 2001  . Menorrhagia   . Uterine fibroid     small  . Mitral valve disorder   . Ventricular tachycardia   . Chronic RLQ pain   . IBS (irritable bowel syndrome)   . Hiatal hernia   . GERD (gastroesophageal reflux disease)   . Plantar fasciitis   . Carpal tunnel syndrome, bilateral   . Dyslipidemia   . Anxiety   . History of migraine headaches     ROS:   All systems reviewed and negative except as noted in the HPI.   Past Surgical History  Procedure Laterality Date  . Patella  arthroplasty    . Cspine  05/1999    c5-6  . Echocardiogram  2000/2001  . Spine surgery    . Cardiac electrophysiology mapping and ablation  2002  . Knee surgery  1998     Family History  Problem Relation Age of Onset  . Hiatal hernia Mother   . Irritable bowel syndrome Mother   . Hypertension Mother   . Colon cancer Neg Hx   . Cancer Father   . Hypertension Sister   . Seizures Sister   . Headache Sister   . Thyroid disease Mother      History   Social History  . Marital Status: Married    Spouse Name: [name]    Number of Children: 2  . Years of Education: 16   Occupational History  . Oncologist    Social History Main Topics  . Smoking status: Never Smoker   . Smokeless tobacco: Never Used  . Alcohol Use: 3.0 oz/week    5 Glasses of wine per week  . Drug Use: No  . Sexual Activity: Yes    Partners: Male    Birth Control/ Protection: None     Comment: h/o infertility   Other Topics Concern  . Not on file   Social History Narrative  Patient is married [name]) lives with her husband (2nd marriage, [employer]). Two daughters live with them.  Oldest daughter is grown and lives locally with her husband and child.   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   Patient is right-handed.   Patient drinks one cup of caffeine daily.     There were no vitals taken for this visit.  Physical Exam:  Well appearing middle-aged woman, NAD HEENT: Unremarkable Neck:  No JVD, no thyromegally Lungs:  Clear with no wheezes, rales, or rhonchi. HEART:  Regular rate rhythm, no murmurs, no rubs, no clicks Abd:  soft, positive bowel sounds, no organomegally, no rebound, no guarding Ext:  2 plus pulses, no edema, no cyanosis, no clubbing Skin:  No rashes no nodules Neuro:  CN II through XII intact, motor grossly intact  EKG normal sinus rhythm, with normal axis and intervals.  Assess/Plan:

## 2013-09-07 NOTE — Assessment & Plan Note (Signed)
We discussed strategies for weight loss. I have asked her to walk or exercise for an hour a day. She states that she will try and eat less and increase her physical activity.

## 2013-09-15 ENCOUNTER — Telehealth: Payer: Self-pay | Admitting: Physician Assistant

## 2013-09-15 DIAGNOSIS — F418 Other specified anxiety disorders: Secondary | ICD-10-CM

## 2013-09-16 MED ORDER — ALPRAZOLAM 1 MG PO TABS: ORAL_TABLET | ORAL | Status: DC

## 2013-09-16 NOTE — Telephone Encounter (Signed)
Patient notified via My Chart.  Meds ordered this encounter  Medications  . ALPRAZolam (XANAX) 1 MG tablet    Sig: TAKE 1 TABLET AT BEDTIME AS NEEDED FOR SLEEP    Dispense:  30 tablet    Refill:  0    Order Specific Question:  Supervising Provider    Answer:  DOOLITTLE, ROBERT P [[id number]]

## 2013-09-26 ENCOUNTER — Other Ambulatory Visit: Payer: Self-pay | Admitting: Internal Medicine

## 2013-10-19 ENCOUNTER — Ambulatory Visit (INDEPENDENT_AMBULATORY_CARE_PROVIDER_SITE_OTHER): Payer: 59 | Admitting: Physician Assistant

## 2013-10-19 VITALS — BP 126/82 | HR 88 | Temp 98.2°F | Resp 16 | Ht 65.0 in | Wt 184.0 lb

## 2013-10-19 DIAGNOSIS — Z1211 Encounter for screening for malignant neoplasm of colon: Secondary | ICD-10-CM

## 2013-10-19 DIAGNOSIS — I4729 Other ventricular tachycardia: Secondary | ICD-10-CM

## 2013-10-19 DIAGNOSIS — E785 Hyperlipidemia, unspecified: Secondary | ICD-10-CM

## 2013-10-19 DIAGNOSIS — I472 Ventricular tachycardia, unspecified: Secondary | ICD-10-CM

## 2013-10-19 DIAGNOSIS — R059 Cough, unspecified: Secondary | ICD-10-CM | POA: Insufficient documentation

## 2013-10-19 DIAGNOSIS — R05 Cough: Secondary | ICD-10-CM

## 2013-10-19 DIAGNOSIS — Z130 Encounter for screening for diseases of the blood and blood-forming organs and certain disorders involving the immune mechanism: Secondary | ICD-10-CM

## 2013-10-19 DIAGNOSIS — Z23 Encounter for immunization: Secondary | ICD-10-CM

## 2013-10-19 DIAGNOSIS — Z0000 Encounter for general adult medical examination without abnormal findings: Secondary | ICD-10-CM

## 2013-10-19 DIAGNOSIS — F418 Other specified anxiety disorders: Secondary | ICD-10-CM

## 2013-10-19 LAB — COMPREHENSIVE METABOLIC PANEL WITH GFR
ALT: 18 U/L (ref 0–35)
AST: 18 U/L (ref 0–37)
Albumin: 4.6 g/dL (ref 3.5–5.2)
Alkaline Phosphatase: 61 U/L (ref 39–117)
BUN: 18 mg/dL (ref 6–23)
CO2: 29 meq/L (ref 19–32)
Calcium: 9.4 mg/dL (ref 8.4–10.5)
Chloride: 103 meq/L (ref 96–112)
Creat: 0.81 mg/dL (ref 0.50–1.10)
Glucose, Bld: 93 mg/dL (ref 70–99)
Potassium: 4.6 meq/L (ref 3.5–5.3)
Sodium: 140 meq/L (ref 135–145)
Total Bilirubin: 0.6 mg/dL (ref 0.2–1.2)
Total Protein: 7.1 g/dL (ref 6.0–8.3)

## 2013-10-19 LAB — POCT CBC
Granulocyte percent: 59 %G (ref 37–80)
HCT, POC: 42.9 % (ref 37.7–47.9)
Hemoglobin: 13.7 g/dL (ref 12.2–16.2)
LYMPH, POC: 1.9 (ref 0.6–3.4)
MCH, POC: 30.7 pg (ref 27–31.2)
MCHC: 32 g/dL (ref 31.8–35.4)
MCV: 95.8 fL (ref 80–97)
MID (cbc): 0.3 (ref 0–0.9)
MPV: 7.1 fL (ref 0–99.8)
POC GRANULOCYTE: 3.2 (ref 2–6.9)
POC LYMPH %: 34.8 % (ref 10–50)
POC MID %: 6.2 %M (ref 0–12)
Platelet Count, POC: 230 10*3/uL (ref 142–424)
RBC: 4.48 M/uL (ref 4.04–5.48)
RDW, POC: 13 %
WBC: 5.4

## 2013-10-19 LAB — LIPID PANEL
CHOL/HDL RATIO: 2.8 ratio
Cholesterol: 176 mg/dL (ref 0–200)
HDL: 62 mg/dL (ref >39)
LDL Cholesterol: 92 mg/dL (ref 0–99)
Triglycerides: 111 mg/dL (ref <150)
VLDL: 22 mg/dL (ref 0–40)

## 2013-10-19 LAB — TSH: TSH: 2.056 u[IU]/mL (ref 0.350–4.500)

## 2013-10-19 MED ORDER — ALPRAZOLAM 1 MG PO TABS: ORAL_TABLET | ORAL | Status: DC

## 2013-10-19 MED ORDER — ATORVASTATIN CALCIUM 20 MG PO TABS: ORAL_TABLET | ORAL | Status: DC

## 2013-10-19 MED ORDER — PANTOPRAZOLE SODIUM 40 MG PO TBEC: 40.0000 mg | DELAYED_RELEASE_TABLET | Freq: Every day | ORAL | Status: DC

## 2013-10-19 NOTE — Progress Notes (Signed)
Subjective:    Patient ID: [name], female    DOB: Jun 05, 1965, 48 y.o.   MRN: [id number]   PCP: Lannette Avellino, PA-C  Chief Complaint  Patient presents with  . Annual Exam  . Cough    x 3 months      Active Ambulatory Problems    Diagnosis Date Noted  . Hyperlipidemia 10/04/2008  . CARPAL TUNNEL SYNDROME, BILATERAL 05/06/2007  . MITRAL VALVE DISORDERS 05/06/2007  . VENTRICULAR TACHYCARDIA 10/04/2008  . ABNORMAL HEART RHYTHMS 05/06/2007  . GERD 05/06/2007  . HIATAL HERNIA 05/06/2007  . IBS 05/06/2007  . PLANTAR FASCIITIS 05/06/2007  . RLQ PAIN 06/07/2007  . Mild obesity 09/07/2013   Resolved Ambulatory Problems    Diagnosis Date Noted  . No Resolved Ambulatory Problems   Past Medical History  Diagnosis Date  . Insomnia   . Arrhythmia ventricular   . Menorrhagia   . Uterine fibroid   . Mitral valve disorder   . Ventricular tachycardia   . Chronic RLQ pain   . IBS (irritable bowel syndrome)   . Hiatal hernia   . GERD (gastroesophageal reflux disease)   . Plantar fasciitis   . Carpal tunnel syndrome, bilateral   . Dyslipidemia   . Anxiety   . History of migraine headaches     Past Surgical History  Procedure Laterality Date  . Patella arthroplasty    . Cspine  05/1999    c5-6  . Echocardiogram  2000/2001  . Spine surgery    . Cardiac electrophysiology mapping and ablation  2002  . Knee surgery  1998    Allergies  Allergen Reactions  . Crestor [Rosuvastatin Calcium]   . Verapamil    Prior to Admission medications   Medication Sig Start Date End Date Taking? Authorizing Provider  ALPRAZolam [name]) 1 MG tablet TAKE 1 TABLET AT BEDTIME AS NEEDED FOR SLEEP 10/19/13  Yes Adie Vilar S Sebrina Kessner, PA-C  atorvastatin (LIPITOR) 20 MG tablet TAKE 1 TABLET BY MOUTH EVERY DAY 10/19/13  Yes Eriq Hufford S Jazzalyn Loewenstein, PA-C  diclofenac (VOLTAREN) 75 MG EC tablet Take 75 mg by mouth as needed.   Yes Historical Provider, MD  Estradiol-Norethindrone Acet (MIMVEY PO)  Take by mouth daily.   Yes Historical Provider, MD  flecainide (TAMBOCOR) 50 MG tablet Take 1 tablet (50 mg total) by mouth 2 (two) times daily. 09/07/13  Yes Evans Lance, MD  ipratropium (ATROVENT) 0.03 % nasal spray SPRAY TWICE IN EACH NOSTRIL TWICE A DAY   Yes Rockey Guarino Janalee Dane, PA-C    History   Social History  . Marital Status: Married    Spouse Name: [name]    Number of Children: 2  . Years of Education: 16   Occupational History  . Oncologist    Social History Main Topics  . Smoking status: Never Smoker   . Smokeless tobacco: Never Used  . Alcohol Use: 3.0 oz/week    5 Glasses of wine per week  . Drug Use: No  . Sexual Activity: Yes    Partners: Male    Birth Control/ Protection: None     Comment: h/o infertility   Other Topics Concern  . None   Social History Narrative   Patient is married [name]) lives with her husband (2nd marriage, [employer]). Two daughters live with them.  Oldest daughter is grown and lives locally with her husband and children.   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   Patient is right-handed.   Patient drinks one cup of caffeine daily.    family history includes Cancer in her father; Headache in her sister; Heart disease (age of onset: 60) in her mother; Hiatal hernia in her mother; Hypertension in her mother and sister; Irritable bowel syndrome in her mother; Seizures in her sister; Thyroid disease in her mother. There is no history of Colon cancer. indicated that her mother is alive. She indicated that her father is deceased. She indicated that her sister is alive. She indicated that her brother is alive. She indicated that her maternal grandmother is deceased. She indicated that her maternal grandfather is deceased. She indicated that her paternal grandmother is deceased. She indicated that her paternal grandfather is deceased. She indicated that her daughter  is alive.   HPI  Presents for Annual Wellness Exam. Had fasting labs drawn this morning. GYN for pap yesterday. Mammography in 08/2013. Cardiology 09/2013, to follow up in 2 years.  Cough x several months. Feels like there's a tickling sensation in the throat, and the knows she's going to have a coughing jag.  Can't talk during it, and feels like she's going to gag. Usually doesn't feel like she has drainage in the throat, but today she does.  No heartburn/indigestion/belching. OTC Prevacid without benefit. Summer of 2013 was seen here with the same symptoms. Then seen by cardiology, pulmonology and ENT. 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 Otherwise negative.    Objective:   Physical Exam  Vitals reviewed. Constitutional: She is oriented to person, place, and time. Vital signs are normal. She appears well-developed and well-nourished. She is active and cooperative. No distress.  HENT:  Head: Normocephalic and atraumatic.  Right Ear: Hearing, tympanic membrane, external ear and ear canal normal. No foreign bodies.  Left Ear: Hearing, tympanic membrane, external ear and ear canal normal. No foreign bodies.  Nose: Nose normal.  Mouth/Throat: Uvula is midline, oropharynx is clear and moist and mucous membranes are normal. No oral lesions. Normal dentition. No dental abscesses or uvula swelling. No oropharyngeal exudate.  Eyes: Conjunctivae, EOM and lids are normal. Pupils are equal, round, and reactive to light. Right eye exhibits no discharge. Left eye exhibits no discharge. No scleral icterus.  Fundoscopic exam:      The right eye shows no arteriolar narrowing, no AV nicking, no exudate, no hemorrhage and no papilledema. The right eye shows red reflex.       The left eye shows no arteriolar narrowing, no AV nicking, no exudate, no hemorrhage and no papilledema. The left eye shows red reflex.  Neck: Trachea normal,  normal range of motion and full passive range of motion without pain. Neck supple. No spinous process tenderness and no muscular tenderness present. No mass and no thyromegaly present.  Cardiovascular: Normal rate, regular rhythm, normal heart sounds, intact distal pulses and normal pulses.   Pulmonary/Chest: Effort normal and breath sounds normal.  Musculoskeletal: She exhibits no edema and no tenderness.       Cervical back: Normal.       Thoracic back: Normal.       Lumbar back: Normal.  Lymphadenopathy:       Head (right side): No tonsillar, no preauricular, no posterior auricular and no occipital adenopathy present.       Head (left side): No tonsillar, no preauricular, no  posterior auricular and no occipital adenopathy present.    She has no cervical adenopathy.       Right: No supraclavicular adenopathy present.       Left: No supraclavicular adenopathy present.  Neurological: She is alert and oriented to person, place, and time. She has normal strength and normal reflexes. No cranial nerve deficit. She exhibits normal muscle tone. Coordination and gait normal.  Skin: Skin is warm, dry and intact. No rash noted. She is not diaphoretic. No cyanosis or erythema. Nails show no clubbing.  Psychiatric: She has a normal mood and affect. Her speech is normal and behavior is normal. Judgment and thought content normal.      Results for orders placed in visit on 10/19/13  COMPREHENSIVE METABOLIC PANEL      Result Value Ref Range   Sodium 140  135 - 145 mEq/L   Potassium 4.6  3.5 - 5.3 mEq/L   Chloride 103  96 - 112 mEq/L   CO2 29  19 - 32 mEq/L   Glucose, Bld 93  70 - 99 mg/dL   BUN 18  6 - 23 mg/dL   Creat 0.81  0.50 - 1.10 mg/dL   Total Bilirubin 0.6  0.2 - 1.2 mg/dL   Alkaline Phosphatase 61  39 - 117 U/L   AST 18  0 - 37 U/L   ALT 18  0 - 35 U/L   Total Protein 7.1  6.0 - 8.3 g/dL   Albumin 4.6  3.5 - 5.2 g/dL   Calcium 9.4  8.4 - 10.5 mg/dL  TSH      Result Value Ref Range    TSH 2.056  0.350 - 4.500 uIU/mL  LIPID PANEL      Result Value Ref Range   Cholesterol 176  0 - 200 mg/dL   Triglycerides 111  <150 mg/dL   HDL 62  >39 mg/dL   Total CHOL/HDL Ratio 2.8     VLDL 22  0 - 40 mg/dL   LDL Cholesterol 92  0 - 99 mg/dL  POCT CBC      Result Value Ref Range   WBC 5.4     Lymph, poc 1.9  0.6 - 3.4   POC LYMPH PERCENT 34.8  10 - 50 %L   MID (cbc) 0.3  0 - 0.9   POC MID % 6.2  0 - 12 %M   POC Granulocyte 3.2  2 - 6.9   Granulocyte percent 59.0  37 - 80 %G   RBC 4.48  4.04 - 5.48 M/uL   Hemoglobin 13.7  12.2 - 16.2 g/dL   HCT, POC 42.9  37.7 - 47.9 %   MCV 95.8  80 - 97 fL   MCH, POC 30.7  27 - 31.2 pg   MCHC 32.0  31.8 - 35.4 g/dL   RDW, POC 13.0     Platelet Count, POC 230.0  142 - 424 K/uL   MPV 7.1  0 - 99.8 fL       Assessment & Plan:  1. Annual physical exam Age appropriate anticipatory guidance provided.  2. Hyperlipidemia Well-controlled. Continue current treatment. - Comprehensive metabolic panel - Lipid panel - atorvastatin (LIPITOR) 20 MG tablet; TAKE 1 TABLET BY MOUTH EVERY DAY  Dispense: 90 tablet; Refill: 3  3. Screening for deficiency anemia No evidence of anemia. - POCT CBC  4. VENTRICULAR TACHYCARDIA Continue current regimen, as symptoms are controlled. Follow-up with Dr. Lovena Le per his instructions. - TSH  5.  Need for influenza vaccination - Flu Vaccine QUAD 36+ mos IM  6. Screening for colon cancer - Ambulatory referral to Gastroenterology  7. Depression with anxiety Stable/controlled. No longer on Fluoxetine. Continue current regimen. - ALPRAZolam (XANAX) 1 MG tablet; TAKE 1 TABLET AT BEDTIME AS NEEDED FOR SLEEP  Dispense: 30 tablet; Refill: 0  8. 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 Will need higher dose and longer treatment than classic GERD. - Ambulatory referral to ENT - pantoprazole (PROTONIX) 40 MG tablet; Take 1 tablet (40 mg total) by mouth daily.  Dispense: 90 tablet; Refill: 3   Fara Chute, PA-C Physician  Assistant-Certified [facility]

## 2013-10-19 NOTE — Patient Instructions (Signed)
Keeping You Healthy  Get These Tests  Blood Pressure- Have your blood pressure checked by your healthcare provider at least once a year.  Normal blood pressure is 120/80.  Weight- Have your body mass index (BMI) calculated to screen for obesity.  BMI is a measure of body fat based on height and weight.  You can calculate your own BMI at [employer]velB[employer].it  Cholesterol- Have your cholesterol checked every year.  Diabetes- Have your blood sugar checked every year if you have high blood pressure, high cholesterol, a family history of diabetes or if you are overweight.  Pap Smear- Have a pap smear every 1 to 5 years if you have been sexually active.  If you are older than 65 and recent pap smears have been normal you may not need additional pap smears.  In addition, if you have had a hysterectomy  For benign disease additional pap smears are not necessary.  Mammogram-Yearly mammograms are essential for early detection of breast cancer  Screening for Colon Cancer- Colonoscopy starting at age 41. Screening may begin sooner depending on your family history and other health conditions.  Follow up colonoscopy as directed by your Gastroenterologist.  Screening for Osteoporosis- Screening begins at age 50 with bone density scanning, sooner if you are at higher risk for developing Osteoporosis.  Get these medicines  Calcium with Vitamin D- Your body requires 1200-1500 mg of Calcium a day and [phone] IU of Vitamin D a day.  You can only absorb 500 mg of Calcium at a time therefore Calcium must be taken in 2 or 3 separate doses throughout the day.  Hormones- Hormone therapy has been associated with increased risk for certain cancers and heart disease.  Talk to your healthcare provider about if you need relief from menopausal symptoms.  Aspirin- Ask your healthcare provider about taking Aspirin to prevent Heart Disease and Stroke.  Get these Immuniztions  Flu shot- Every fall  Pneumonia  shot- Once after the age of 50; if you are younger ask your healthcare provider if you need a pneumonia shot.  Tetanus- Every ten years.  Zostavax- Once after the age of 50 to prevent shingles.  Take these steps  Don't smoke- Your healthcare provider can help you quit. For tips on how to quit, ask your healthcare provider or go to www.smokefree.gov or call 1-800 QUIT-NOW.  Be physically active- Exercise 5 days a week for a minimum of 30 minutes.  If you are not already physically active, start slow and gradually work up to 30 minutes of moderate physical activity.  Try walking, dancing, bike riding, swimming, etc.  Eat a healthy diet- Eat a variety of healthy foods such as fruits, vegetables, whole grains, low fat milk, low fat cheeses, yogurt, lean meats, chicken, fish, eggs, dried beans, tofu, etc.  For more information go to www.thenutritionsource.org  Dental visit- Brush and floss teeth twice daily; visit your dentist twice a year.  Eye exam- Visit your Optometrist or Ophthalmologist yearly.  Drink alcohol in moderation- Limit alcohol intake to one drink or less a day.  Never drink and drive.  Depression- Your emotional health is as important as your physical health.  If you're feeling down or losing interest in things you normally enjoy, please talk to your healthcare provider.  Seat Belts- can save your life; always wear one  Smoke/Carbon Monoxide detectors- These detectors need to be installed on the appropriate level of your home.  Replace batteries at least once a year.  Violence- If anyone  is threatening or hurting you, please tell your healthcare provider.  Living Will/ Health care power of attorney- Discuss with your healthcare provider and family.  Look up Laryngopharyngeal Reflux (LPR) at www.uptodate.com

## 2013-10-20 ENCOUNTER — Encounter: Payer: Self-pay | Admitting: Internal Medicine

## 2013-11-02 ENCOUNTER — Encounter: Payer: Self-pay | Admitting: Physician Assistant

## 2013-11-02 DIAGNOSIS — R059 Cough, unspecified: Secondary | ICD-10-CM

## 2013-11-02 DIAGNOSIS — R05 Cough: Secondary | ICD-10-CM

## 2013-11-16 ENCOUNTER — Other Ambulatory Visit: Payer: Self-pay | Admitting: Physician Assistant

## 2013-11-16 ENCOUNTER — Encounter: Payer: Self-pay | Admitting: Physician Assistant

## 2013-11-16 DIAGNOSIS — F418 Other specified anxiety disorders: Secondary | ICD-10-CM

## 2013-11-16 MED ORDER — ALPRAZOLAM 1 MG PO TABS: ORAL_TABLET | ORAL | Status: DC

## 2013-11-16 MED ORDER — BUPROPION HCL ER (XL) 150 MG PO TB24: 150.0000 mg | ORAL_TABLET | Freq: Every day | ORAL | Status: DC

## 2013-11-16 NOTE — Telephone Encounter (Signed)
Patient stopped fluoxetine due to weight gain. Desires Wellbutrin. Due to possible interaction with Flecanide, she is instructed to consult with her cardiologist prior to beginning Wellbutrin.

## 2013-11-17 NOTE — Telephone Encounter (Signed)
Called in.

## 2013-11-17 NOTE — Telephone Encounter (Signed)
Notified pt on MyChart

## 2013-11-28 ENCOUNTER — Other Ambulatory Visit: Payer: Self-pay | Admitting: Obstetrics and Gynecology

## 2013-12-06 ENCOUNTER — Encounter (HOSPITAL_COMMUNITY)
Admission: RE | Admit: 2013-12-06 | Discharge: 2013-12-06 | Disposition: A | Discharge status: Home / Self Care | Payer: 59 | Source: Ambulatory Visit | Attending: Obstetrics and Gynecology | Admitting: Obstetrics and Gynecology

## 2013-12-06 ENCOUNTER — Encounter (HOSPITAL_COMMUNITY): Payer: Self-pay

## 2013-12-06 DIAGNOSIS — N736 Female pelvic peritoneal adhesions (postinfective): Secondary | ICD-10-CM | POA: Diagnosis not present

## 2013-12-06 DIAGNOSIS — I472 Ventricular tachycardia: Secondary | ICD-10-CM | POA: Diagnosis not present

## 2013-12-06 DIAGNOSIS — N950 Postmenopausal bleeding: Secondary | ICD-10-CM | POA: Diagnosis not present

## 2013-12-06 DIAGNOSIS — K219 Gastro-esophageal reflux disease without esophagitis: Secondary | ICD-10-CM | POA: Diagnosis not present

## 2013-12-06 DIAGNOSIS — N8320 Unspecified ovarian cysts: Secondary | ICD-10-CM | POA: Diagnosis not present

## 2013-12-06 DIAGNOSIS — N815 Vaginal enterocele: Secondary | ICD-10-CM | POA: Diagnosis not present

## 2013-12-06 DIAGNOSIS — D250 Submucous leiomyoma of uterus: Secondary | ICD-10-CM | POA: Diagnosis not present

## 2013-12-06 DIAGNOSIS — E780 Pure hypercholesterolemia: Secondary | ICD-10-CM | POA: Diagnosis not present

## 2013-12-06 DIAGNOSIS — Z888 Allergy status to other drugs, medicaments and biological substances status: Secondary | ICD-10-CM | POA: Diagnosis not present

## 2013-12-06 DIAGNOSIS — F419 Anxiety disorder, unspecified: Secondary | ICD-10-CM | POA: Diagnosis not present

## 2013-12-06 DIAGNOSIS — N72 Inflammatory disease of cervix uteri: Secondary | ICD-10-CM | POA: Diagnosis not present

## 2013-12-06 HISTORY — DX: Other specified postprocedural states: Z98.890

## 2013-12-06 HISTORY — DX: Headache, unspecified: R51.9

## 2013-12-06 HISTORY — DX: Nonrheumatic mitral (valve) prolapse: I34.1

## 2013-12-06 HISTORY — DX: Headache: R51

## 2013-12-06 HISTORY — DX: Cardiac arrhythmia, unspecified: I49.9

## 2013-12-06 HISTORY — DX: Nausea with vomiting, unspecified: R11.2

## 2013-12-06 LAB — CBC
HCT: 42.7 % (ref 36.0–46.0)
Hemoglobin: 14.3 g/dL (ref 12.0–15.0)
MCH: 31.6 pg (ref 26.0–34.0)
MCHC: 33.5 g/dL (ref 30.0–36.0)
MCV: 94.5 fL (ref 78.0–100.0)
PLATELETS: 222 10*3/uL (ref 150–400)
RBC: 4.52 MIL/uL (ref 3.87–5.11)
RDW: 12.1 % (ref 11.5–15.5)
WBC: 5.8 10*3/uL (ref 4.0–10.5)

## 2013-12-06 LAB — TYPE AND SCREEN
ABO/RH(D): B POS
Antibody Screen: NEGATIVE

## 2013-12-06 LAB — BASIC METABOLIC PANEL
Anion gap: 13 (ref 5–15)
BUN: 14 mg/dL (ref 6–23)
CO2: 28 mEq/L (ref 19–32)
Calcium: 9.8 mg/dL (ref 8.4–10.5)
Chloride: 98 mEq/L (ref 96–112)
Creatinine, Ser: 0.77 mg/dL (ref 0.50–1.10)
GFR calc Af Amer: >90 mL/min (ref >90)
Glucose, Bld: 93 mg/dL (ref 70–99)
Potassium: 4.7 mEq/L (ref 3.7–5.3)
Sodium: 139 mEq/L (ref 137–147)

## 2013-12-06 LAB — ABO/RH: ABO/RH(D): B POS

## 2013-12-06 NOTE — H&P (Signed)
[name], [name]            ACCOUNT NO.:  [id number]  MEDICAL RECORD NO.:  [id number]  LOCATION:  [facility]IO                         FACILITY:  [facility]  PHYSICIAN:  Lovenia Kim, M.D.DATE OF BIRTH:  03-16-1965  DATE OF ADMISSION:  11/24/2013 DATE OF DISCHARGE:                             HISTORY & PHYSICAL   CHIEF COMPLAINT:  Symptomatic fibroids with large submucous fibroid for definitive therapy.  HISTORY OF PRESENT ILLNESS:  A [name], G2, P2, with a history of 1 episode of postmenopausal bleeding on hormone placement therapy, thin endometrium, but large submucous fibroid who presents for definitive therapy.  MEDICATIONS:  Include Activella, Flexeril, Lipitor, Tambocor, and a multivitamin.  PERSONAL HISTORY:  She has a personal history of anxiety, hypercholesterolemia, cardiac dysrhythmia.  ALLERGIES:  She has allergies to [place].  FAMILY HISTORY:  Heart disease, cancer, lung cancer.  SOCIAL HISTORY:  Nonsmoker, nondrinker.  Denies domestic or physical violence.  PAST SURGICAL HISTORY:  History of 2 vaginal deliveries.  PHYSICAL EXAMINATION:  GENERAL:  She is a well-developed, well-nourished white Larsen, in no acute distress. HEENT:  Normal. NECK:  Supple.  Full range of motion. LUNGS:  Clear. HEART:  Regular rate and rhythm. ABDOMEN:  Soft, nontender. PELVIC:  An 8-10 week size uterus.  No adnexal masses. EXTREMITIES:  There are no cords. NEUROLOGIC:  Nonfocal. SKIN:  Intact.  IMPRESSION:  Symptomatic uterine fibroids with large submucosal fibroid with a large intracavitary and intra-myometrial component, not a candidate for a hysteroscopic resection.  PLAN:  Proceed with da Vinci assisted total laparoscopic hysterectomy, bilateral salpingectomy. Pt requests ovarian conservation. Thin endometrium noted and pt declined EBX. Risks of anesthesia, infection, bleeding, injury to surrounding organs, possible need for repair were  discussed. Delayed versus immediate complications to include bowel and bladder injury noted.  The patient acknowledges and wishes to proceed.     Lovenia Kim, M.D.     RJT/MEDQ  D:  12/06/2013  T:  12/06/2013  Job:  [id number]

## 2013-12-06 NOTE — Patient Instructions (Signed)
Your procedure is scheduled on:12/07/13  Enter through the Main Entrance at :46 am Pick up desk phone and dial [phone] and inform us of your arrival.  Please call [phone] if you have any problems the morning of surgery.  Remember: Do not eat food after midnight:tonight Clear liquids are ok until:9am on WED   You may brush your teeth the morning of surgery.  Take these meds the morning of surgery with a sip of water:usual am meds before 9am  DO NOT wear jewelry, eye make-up, lipstick,body lotion, or dark fingernail polish.  (Polished toes are ok) You may wear deodorant.  If you are to be admitted after surgery, leave suitcase in car until your room has been assigned. Patients discharged on the day of surgery will not be allowed to drive home. Wear loose fitting, comfortable clothes for your ride home.

## 2013-12-07 ENCOUNTER — Ambulatory Visit (HOSPITAL_COMMUNITY)
Admission: RE | Admit: 2013-12-07 | Discharge: 2013-12-08 | Disposition: A | Discharge status: Home / Self Care | Payer: 59 | Source: Ambulatory Visit | Attending: Obstetrics and Gynecology | Admitting: Obstetrics and Gynecology

## 2013-12-07 ENCOUNTER — Encounter (HOSPITAL_COMMUNITY)
Admission: RE | Disposition: A | Discharge status: Home / Self Care | Payer: Self-pay | Source: Ambulatory Visit | Attending: Obstetrics and Gynecology

## 2013-12-07 ENCOUNTER — Encounter (HOSPITAL_COMMUNITY): Payer: Self-pay | Admitting: *Deleted

## 2013-12-07 ENCOUNTER — Ambulatory Visit (HOSPITAL_COMMUNITY): Payer: 59 | Admitting: Anesthesiology

## 2013-12-07 DIAGNOSIS — E780 Pure hypercholesterolemia: Secondary | ICD-10-CM | POA: Insufficient documentation

## 2013-12-07 DIAGNOSIS — N8320 Unspecified ovarian cysts: Secondary | ICD-10-CM | POA: Insufficient documentation

## 2013-12-07 DIAGNOSIS — N815 Vaginal enterocele: Secondary | ICD-10-CM | POA: Insufficient documentation

## 2013-12-07 DIAGNOSIS — N736 Female pelvic peritoneal adhesions (postinfective): Secondary | ICD-10-CM | POA: Insufficient documentation

## 2013-12-07 DIAGNOSIS — D250 Submucous leiomyoma of uterus: Secondary | ICD-10-CM | POA: Diagnosis not present

## 2013-12-07 DIAGNOSIS — N72 Inflammatory disease of cervix uteri: Secondary | ICD-10-CM | POA: Insufficient documentation

## 2013-12-07 DIAGNOSIS — Z888 Allergy status to other drugs, medicaments and biological substances status: Secondary | ICD-10-CM | POA: Insufficient documentation

## 2013-12-07 DIAGNOSIS — F419 Anxiety disorder, unspecified: Secondary | ICD-10-CM | POA: Insufficient documentation

## 2013-12-07 DIAGNOSIS — K219 Gastro-esophageal reflux disease without esophagitis: Secondary | ICD-10-CM | POA: Insufficient documentation

## 2013-12-07 DIAGNOSIS — I472 Ventricular tachycardia: Secondary | ICD-10-CM | POA: Insufficient documentation

## 2013-12-07 DIAGNOSIS — N950 Postmenopausal bleeding: Secondary | ICD-10-CM | POA: Insufficient documentation

## 2013-12-07 HISTORY — PX: BILATERAL SALPINGECTOMY: SHX5743

## 2013-12-07 HISTORY — PX: ROBOTIC ASSISTED TOTAL HYSTERECTOMY: SHX6085

## 2013-12-07 LAB — HCG, SERUM, QUALITATIVE: Preg, Serum: NEGATIVE

## 2013-12-07 SURGERY — ROBOTIC ASSISTED TOTAL HYSTERECTOMY
Anesthesia: General | Site: Abdomen

## 2013-12-07 MED ORDER — ONDANSETRON HCL 4 MG/2ML IJ SOLN
INTRAMUSCULAR | Status: AC
  Filled 2013-12-07: qty 2

## 2013-12-07 MED ORDER — HYDROMORPHONE HCL 1 MG/ML IJ SOLN: 0.2500 mg | INTRAMUSCULAR | Status: DC | PRN

## 2013-12-07 MED ORDER — MIDAZOLAM HCL 2 MG/2ML IJ SOLN
INTRAMUSCULAR | Status: DC | PRN
  Administered 2013-12-07: 2 mg via INTRAVENOUS

## 2013-12-07 MED ORDER — DEXTROSE IN LACTATED RINGERS 5 % IV SOLN
INTRAVENOUS | Status: DC
  Administered 2013-12-07 – 2013-12-08 (×2): via INTRAVENOUS

## 2013-12-07 MED ORDER — DEXAMETHASONE SODIUM PHOSPHATE 10 MG/ML IJ SOLN
INTRAMUSCULAR | Status: DC | PRN
  Administered 2013-12-07: 4 mg via INTRAVENOUS

## 2013-12-07 MED ORDER — HYDROMORPHONE HCL 1 MG/ML IJ SOLN
INTRAMUSCULAR | Status: DC | PRN
  Administered 2013-12-07: 1 mg via INTRAVENOUS

## 2013-12-07 MED ORDER — PANTOPRAZOLE SODIUM 40 MG PO TBEC
40.0000 mg | DELAYED_RELEASE_TABLET | Freq: Two times a day (BID) | ORAL | Status: DC
  Administered 2013-12-08: 40 mg via ORAL
  Filled 2013-12-07 (×2): qty 1

## 2013-12-07 MED ORDER — DIPHENHYDRAMINE HCL 12.5 MG/5ML PO ELIX: 12.5000 mg | ORAL_SOLUTION | Freq: Four times a day (QID) | ORAL | Status: DC | PRN

## 2013-12-07 MED ORDER — DIPHENHYDRAMINE HCL 50 MG/ML IJ SOLN: 12.5000 mg | Freq: Four times a day (QID) | INTRAMUSCULAR | Status: DC | PRN

## 2013-12-07 MED ORDER — SODIUM CHLORIDE 0.9 % IJ SOLN
INTRAMUSCULAR | Status: AC
  Filled 2013-12-07: qty 10

## 2013-12-07 MED ORDER — FLECAINIDE ACETATE 50 MG PO TABS
50.0000 mg | ORAL_TABLET | Freq: Two times a day (BID) | ORAL | Status: DC
  Administered 2013-12-07: 50 mg via ORAL
  Filled 2013-12-07 (×2): qty 1

## 2013-12-07 MED ORDER — ACETAMINOPHEN 160 MG/5ML PO SOLN
ORAL | Status: AC
  Administered 2013-12-07: 960 mg via ORAL
  Filled 2013-12-07: qty 40.6

## 2013-12-07 MED ORDER — LIDOCAINE HCL (CARDIAC) 20 MG/ML IV SOLN
INTRAVENOUS | Status: AC
  Filled 2013-12-07: qty 5

## 2013-12-07 MED ORDER — HYDROMORPHONE 0.3 MG/ML IV SOLN
INTRAVENOUS | Status: DC
  Administered 2013-12-07: 0.2 mg via INTRAVENOUS
  Administered 2013-12-07: 18:00:00 via INTRAVENOUS
  Filled 2013-12-07: qty 25

## 2013-12-07 MED ORDER — SCOPOLAMINE 1 MG/3DAYS TD PT72
MEDICATED_PATCH | TRANSDERMAL | Status: AC
  Administered 2013-12-07: 1.5 mg via TRANSDERMAL
  Filled 2013-12-07: qty 1

## 2013-12-07 MED ORDER — METOCLOPRAMIDE HCL 5 MG/ML IJ SOLN
INTRAMUSCULAR | Status: AC
  Administered 2013-12-07: 10 mg via INTRAVENOUS
  Filled 2013-12-07: qty 2

## 2013-12-07 MED ORDER — NEOSTIGMINE METHYLSULFATE 10 MG/10ML IV SOLN
INTRAVENOUS | Status: DC | PRN
  Administered 2013-12-07: 4 mg via INTRAVENOUS

## 2013-12-07 MED ORDER — PROPOFOL 10 MG/ML IV EMUL
INTRAVENOUS | Status: AC
  Filled 2013-12-07: qty 20

## 2013-12-07 MED ORDER — CEFAZOLIN SODIUM-DEXTROSE 2-3 GM-% IV SOLR
INTRAVENOUS | Status: AC
  Filled 2013-12-07: qty 50

## 2013-12-07 MED ORDER — ROCURONIUM BROMIDE 100 MG/10ML IV SOLN
INTRAVENOUS | Status: DC | PRN
  Administered 2013-12-07: 20 mg via INTRAVENOUS
  Administered 2013-12-07: 50 mg via INTRAVENOUS

## 2013-12-07 MED ORDER — HYDROMORPHONE HCL 1 MG/ML IJ SOLN
INTRAMUSCULAR | Status: AC
  Filled 2013-12-07: qty 1

## 2013-12-07 MED ORDER — PROPOFOL 10 MG/ML IV BOLUS
INTRAVENOUS | Status: DC | PRN
  Administered 2013-12-07: 150 mg via INTRAVENOUS

## 2013-12-07 MED ORDER — GLYCOPYRROLATE 0.2 MG/ML IJ SOLN
INTRAMUSCULAR | Status: DC | PRN
  Administered 2013-12-07: 0.6 mg via INTRAVENOUS

## 2013-12-07 MED ORDER — LACTATED RINGERS IV SOLN
INTRAVENOUS | Status: DC
  Administered 2013-12-07 (×2): via INTRAVENOUS

## 2013-12-07 MED ORDER — FENTANYL CITRATE 0.05 MG/ML IJ SOLN
INTRAMUSCULAR | Status: AC
  Filled 2013-12-07: qty 5

## 2013-12-07 MED ORDER — SODIUM CHLORIDE 0.9 % IV SOLN
INTRAVENOUS | Status: DC | PRN
  Administered 2013-12-07: 6 mL
  Administered 2013-12-07: 60 mL

## 2013-12-07 MED ORDER — FENTANYL CITRATE 0.05 MG/ML IJ SOLN
INTRAMUSCULAR | Status: DC | PRN
  Administered 2013-12-07: 100 ug via INTRAVENOUS
  Administered 2013-12-07: 50 ug via INTRAVENOUS
  Administered 2013-12-07 (×3): 100 ug via INTRAVENOUS
  Administered 2013-12-07: 50 ug via INTRAVENOUS

## 2013-12-07 MED ORDER — LIDOCAINE HCL (CARDIAC) 20 MG/ML IV SOLN
INTRAVENOUS | Status: DC | PRN
  Administered 2013-12-07: 50 mg via INTRAVENOUS

## 2013-12-07 MED ORDER — CEFAZOLIN SODIUM-DEXTROSE 2-3 GM-% IV SOLR
2.0000 g | INTRAVENOUS | Status: AC
  Administered 2013-12-07: 2 g via INTRAVENOUS

## 2013-12-07 MED ORDER — MIDAZOLAM HCL 2 MG/2ML IJ SOLN
INTRAMUSCULAR | Status: AC
  Filled 2013-12-07: qty 2

## 2013-12-07 MED ORDER — ONDANSETRON HCL 4 MG/2ML IJ SOLN
4.0000 mg | Freq: Four times a day (QID) | INTRAMUSCULAR | Status: DC | PRN
  Administered 2013-12-07: 4 mg via INTRAVENOUS
  Filled 2013-12-07 (×2): qty 2

## 2013-12-07 MED ORDER — ACETAMINOPHEN 160 MG/5ML PO SOLN
960.0000 mg | Freq: Four times a day (QID) | ORAL | Status: DC | PRN
  Administered 2013-12-07: 960 mg via ORAL

## 2013-12-07 MED ORDER — SODIUM CHLORIDE 0.9 % IJ SOLN
INTRAMUSCULAR | Status: AC
  Filled 2013-12-07: qty 50

## 2013-12-07 MED ORDER — OXYCODONE-ACETAMINOPHEN 5-325 MG PO TABS
1.0000 | ORAL_TABLET | ORAL | Status: DC | PRN
  Administered 2013-12-08: 1 via ORAL
  Filled 2013-12-07: qty 1

## 2013-12-07 MED ORDER — SODIUM CHLORIDE 0.9 % IJ SOLN: 9.0000 mL | INTRAMUSCULAR | Status: DC | PRN

## 2013-12-07 MED ORDER — LACTATED RINGERS IR SOLN
Status: DC | PRN
  Administered 2013-12-07: 3000 mL

## 2013-12-07 MED ORDER — METOCLOPRAMIDE HCL 5 MG/ML IJ SOLN
10.0000 mg | Freq: Once | INTRAMUSCULAR | Status: AC
  Administered 2013-12-07: 10 mg via INTRAVENOUS

## 2013-12-07 MED ORDER — KETOROLAC TROMETHAMINE 30 MG/ML IJ SOLN
INTRAMUSCULAR | Status: DC | PRN
  Administered 2013-12-07: 30 mg via INTRAVENOUS

## 2013-12-07 MED ORDER — ONDANSETRON HCL 4 MG/2ML IJ SOLN
4.0000 mg | INTRAMUSCULAR | Status: AC
  Administered 2013-12-07: 4 mg via INTRAVENOUS

## 2013-12-07 MED ORDER — DEXAMETHASONE SODIUM PHOSPHATE 4 MG/ML IJ SOLN
INTRAMUSCULAR | Status: AC
  Filled 2013-12-07: qty 1

## 2013-12-07 MED ORDER — TRAMADOL HCL 50 MG PO TABS
50.0000 mg | ORAL_TABLET | Freq: Four times a day (QID) | ORAL | Status: DC | PRN
  Administered 2013-12-08 (×2): 50 mg via ORAL
  Filled 2013-12-07 (×2): qty 1

## 2013-12-07 MED ORDER — NALOXONE HCL 0.4 MG/ML IJ SOLN: 0.4000 mg | INTRAMUSCULAR | Status: DC | PRN

## 2013-12-07 MED ORDER — ROCURONIUM BROMIDE 100 MG/10ML IV SOLN
INTRAVENOUS | Status: AC
  Filled 2013-12-07: qty 1

## 2013-12-07 MED ORDER — SCOPOLAMINE 1 MG/3DAYS TD PT72
1.0000 | MEDICATED_PATCH | Freq: Once | TRANSDERMAL | Status: DC
  Administered 2013-12-07: 1.5 mg via TRANSDERMAL

## 2013-12-07 MED ORDER — BUPROPION HCL ER (XL) 150 MG PO TB24
150.0000 mg | ORAL_TABLET | Freq: Every day | ORAL | Status: DC
  Administered 2013-12-08: 150 mg via ORAL
  Filled 2013-12-07: qty 1

## 2013-12-07 MED ORDER — ONDANSETRON HCL 4 MG/2ML IJ SOLN
INTRAMUSCULAR | Status: DC | PRN
  Administered 2013-12-07: 4 mg via INTRAVENOUS

## 2013-12-07 MED ORDER — ROPIVACAINE HCL 5 MG/ML IJ SOLN
INTRAMUSCULAR | Status: AC
  Filled 2013-12-07: qty 60

## 2013-12-07 SURGICAL SUPPLY — 53 items
BARRIER ADHS 3X4 INTERCEED (GAUZE/BANDAGES/DRESSINGS) IMPLANT
CATH FOLEY 3WAY  5CC 16FR (CATHETERS) ×2
CATH FOLEY 3WAY 5CC 16FR (CATHETERS) ×2 IMPLANT
CLOTH BEACON ORANGE TIMEOUT ST (SAFETY) ×4 IMPLANT
CONT PATH 16OZ SNAP LID 3702 (MISCELLANEOUS) ×4 IMPLANT
COVER BACK TABLE 60X90IN (DRAPES) ×8 IMPLANT
COVER TIP SHEARS 8 DVNC (MISCELLANEOUS) ×2 IMPLANT
COVER TIP SHEARS 8MM DA VINCI (MISCELLANEOUS) ×2
DECANTER SPIKE VIAL GLASS SM (MISCELLANEOUS) ×16 IMPLANT
DRAPE HUG U DISPOSABLE (DRAPE) ×4 IMPLANT
DRAPE WARM FLUID 44X44 (DRAPE) ×4 IMPLANT
DURAPREP 26ML APPLICATOR (WOUND CARE) ×4 IMPLANT
ELECT REM PT RETURN 9FT ADLT (ELECTROSURGICAL) ×4
ELECTRODE REM PT RTRN 9FT ADLT (ELECTROSURGICAL) ×2 IMPLANT
EVACUATOR SMOKE 8.L (FILTER) ×4 IMPLANT
GAUZE VASELINE 3X9 (GAUZE/BANDAGES/DRESSINGS) IMPLANT
GLOVE BIO SURGEON STRL SZ7.5 (GLOVE) ×12 IMPLANT
GLOVE BIOGEL PI IND STRL 7.0 (GLOVE) ×8 IMPLANT
GLOVE BIOGEL PI INDICATOR 7.0 (GLOVE) ×8
GOWN STRL REUS W/TWL LRG LVL3 (GOWN DISPOSABLE) ×44 IMPLANT
KIT ACCESSORY DA VINCI DISP (KITS) ×2
KIT ACCESSORY DVNC DISP (KITS) ×2 IMPLANT
LEGGING LITHOTOMY PAIR STRL (DRAPES) ×4 IMPLANT
LIQUID BAND (GAUZE/BANDAGES/DRESSINGS) ×4 IMPLANT
NEEDLE INSUFFLATION 150MM (ENDOMECHANICALS) ×4 IMPLANT
OCCLUDER COLPOPNEUMO (BALLOONS) ×4 IMPLANT
PACK ROBOT WH (CUSTOM PROCEDURE TRAY) ×4 IMPLANT
PAD PREP 24X48 CUFFED NSTRL (MISCELLANEOUS) ×8 IMPLANT
PAD TRENDELENBURG POSITION (MISCELLANEOUS) ×4 IMPLANT
PROTECTOR NERVE ULNAR (MISCELLANEOUS) ×8 IMPLANT
SET CYSTO W/LG BORE CLAMP LF (SET/KITS/TRAYS/PACK) IMPLANT
SET IRRIG TUBING LAPAROSCOPIC (IRRIGATION / IRRIGATOR) ×4 IMPLANT
SUT VIC AB 0 CT1 27 (SUTURE) ×4
SUT VIC AB 0 CT1 27XBRD ANBCTR (SUTURE) ×4 IMPLANT
SUT VICRYL 0 UR6 27IN ABS (SUTURE) ×4 IMPLANT
SUT VICRYL RAPIDE 4/0 PS 2 (SUTURE) ×8 IMPLANT
SUT VLOC 180 0 9IN  GS21 (SUTURE) ×2
SUT VLOC 180 0 9IN GS21 (SUTURE) ×2 IMPLANT
SYR 50ML LL SCALE MARK (SYRINGE) ×4 IMPLANT
SYRINGE 10CC LL (SYRINGE) ×4 IMPLANT
TIP RUMI ORANGE 6.7MMX12CM (TIP) IMPLANT
TIP UTERINE 5.1X6CM LAV DISP (MISCELLANEOUS) IMPLANT
TIP UTERINE 6.7X10CM GRN DISP (MISCELLANEOUS) ×4 IMPLANT
TIP UTERINE 6.7X6CM WHT DISP (MISCELLANEOUS) IMPLANT
TIP UTERINE 6.7X8CM BLUE DISP (MISCELLANEOUS) IMPLANT
TOWEL OR 17X24 6PK STRL BLUE (TOWEL DISPOSABLE) ×12 IMPLANT
TROCAR DISP BLADELESS 8 DVNC (TROCAR) ×2 IMPLANT
TROCAR DISP BLADELESS 8MM (TROCAR) ×2
TROCAR XCEL 12X100 BLDLESS (ENDOMECHANICALS) IMPLANT
TROCAR XCEL NON-BLD 5MMX100MML (ENDOMECHANICALS) ×4 IMPLANT
TROCAR Z-THREAD 12X150 (TROCAR) ×4 IMPLANT
WARMER LAPAROSCOPE (MISCELLANEOUS) ×4 IMPLANT
WATER STERILE IRR 1000ML POUR (IV SOLUTION) ×12 IMPLANT

## 2013-12-07 NOTE — Op Note (Signed)
Sandra, Larsen            ACCOUNT NO.:  [id number]  MEDICAL RECORD NO.:  [id number]  LOCATION:  9320                          FACILITY:  [place]  PHYSICIAN:  Lovenia Kim, M.D.DATE OF BIRTH:  1965-01-30  DATE OF PROCEDURE: DATE OF DISCHARGE:                              OPERATIVE REPORT   PREOPERATIVE DIAGNOSIS:  Symptomatic uterine fibroids with large submucosal fibroid noted.  POSTOPERATIVE DIAGNOSES:  Symptomatic uterine fibroids with large submucosal fibroid noted plus pelvic adhesions plus simple right ovarian cyst plus enterocele.  PROCEDURE:  Da Vinci total laparoscopic hysterectomy, bilateral salpingectomy, lysis of adhesions, McCall culdoplasty, right ovarian cystectomy.  SURGEON:  Lovenia Kim, MD  ASSISTANTValentino Saxon.  ANESTHESIA:  General and local.  ESTIMATED BLOOD LOSS:  100 mL.  COMPLICATIONS:  None.  DRAINS:  Foley.  COUNTS:  Correct.  DISPOSITION:  The patient to recovery in good condition.  BRIEF OPERATIVE NOTE:  After being apprised of risks of anesthesia, infection, bleeding, injury to surrounding organs, possible need for repair, delayed versus immediate complications to include bowel and bladder injury, possible need for repair, consent was signed.  The patient was brought to the operating room where she was administered general anesthetic without complications.  Prepped and draped in usual sterile fashion.  Foley catheter placed.  RUMI retractor placed vaginally without difficulty.  Exam under anesthesia reveals an 8-10 week size uterus and no adnexal masses.  At this time, an infraumbilical incision was made with a scalpel.  Veress needle placed, opening pressure of -2.  A 4 L of CO2 insufflated without difficulty.  Trocar was placed atraumatically.  Pictures taken.  Normal liver and gallbladder bed noted.  Appendix not visualized.  Normal anterior and posterior cul-de-sac, right adnexal adhesions to the bowel, right ovarian  cyst.  Left adnexal adhesions.  Normal left tube, fibroid laid in the uterus.  At this time, robotic ports were placed one on the right and one on the left with a 5-mm port additionally placed on the left. Deep Trendelenburg position was established and the robot was docked in a standard fashion.  At this time, the PK forceps and Endo Shears were placed.  The left ureter was identified.  The left mesosalpinx was cauterized along the base.  The tube was detached at the level of the uterine cornua.  The retroperitoneal space was entered.  The ureter was identified and dissected sharply off the medial leaf of the peritoneum. At this time, the round ligament was divided.  The left uterine vessel was skeletonized posteriorly.  The bladder flap was developed sharply. The uterine vessels were then skeletonized anteriorly, and bipolar cauterized in a 3-point method but not divided.  On the right side, similarly adnexal adhesions were lysed sharply.  The right ovarian cyst was opened using monopolar cautery and excised without difficulty.  The right tube and ovary otherwise appeared normal.  The right mesosalpinx divided.  The right ureter identified.  The retroperitoneal space entered, and the ureter was seen peristalsing normally along the medial leaf of the peritoneum.  The posterior broad ligament was developed to the left.  The right uterine vessel was skeletonized posteriorly.  The round ligament was divided.  Bladder  flap was further developed and the right uterine vessel was skeletonized anteriorly, cauterized, and divided.  At this time, the left uterine vessel was divided.  Good hemostasis was noted.  Bladder flap was well developed off the bulging of the RUMI retractor anteriorly, and the cervicovaginal junction was identified with the bulging RUMI cup and divided using monopolar cautery detaching the specimen which was retracted into the vagina and removed without any need for  morcellation.  The vaginal cuff was then closed using a 0 V-Loc suture in a continuous running fashion.  A second imbricating layer was placed and a McCall culdoplasty suture was placed to plicate the visualized enterocele.  Ureters were then seen peristalsing normally and bilaterally.  Urine output was good and urine was copious and clear.  At this time, good hemostasis was achieved. Irrigation was accomplished.  Trendelenburg position was reversed.  All ports were removed under direct visualization.  CO2 was released. Incisions were closed using 0 Vicryl, 4-0 Vicryl, and Dermabond.  The patient tolerated the procedure well, was awakened, and transferred to recovery in good condition.     Lovenia Kim, M.D.     RJT/MEDQ  D:  12/07/2013  T:  12/07/2013  Job:  [id number]

## 2013-12-07 NOTE — Op Note (Signed)
12/07/2013  2:48 PM  PATIENT:  [name]  48 y.o. female  PRE-OPERATIVE DIAGNOSIS:  Fibroid  POST-OPERATIVE DIAGNOSIS:  Fibroid  PROCEDURE:  Procedure(s): ROBOTIC ASSISTED TOTAL HYSTERECTOMY BILATERAL SALPINGECTOMY Right ovarian cystectomy Mccall cul de plasty  Lysis of adhesions  SURGEON:  Surgeon(s): Lovenia Kim, MD Floyce Stakes. Pamala Hurry, MD  ASSISTANTSPamala Hurry, MD   ANESTHESIA:   local and general  ESTIMATED BLOOD LOSS: * No blood loss amount entered *   DRAINS: Urinary Catheter (Foley)   LOCAL MEDICATIONS USED:  LIDOCAINE  and Amount: 10 ml  SPECIMEN:  Source of Specimen:  uterus , cervix and bilateral tubes  DISPOSITION OF SPECIMEN:  PATHOLOGY  COUNTS:  YES  DICTATION #: [id number]  PLAN OF CARE: 23 hr observation  PATIENT DISPOSITION:  PACU - hemodynamically stable.

## 2013-12-07 NOTE — Anesthesia Procedure Notes (Signed)
Procedure Name: Intubation Date/Time: 12/07/2013 12:51 PM Performed by: Jonna Munro Pre-anesthesia Checklist: Patient identified, Emergency Drugs available, Suction available, Patient being monitored and Timeout performed Patient Re-evaluated:Patient Re-evaluated prior to inductionOxygen Delivery Method: Circle system utilized Preoxygenation: Pre-oxygenation with 100% oxygen Intubation Type: IV induction Ventilation: Mask ventilation without difficulty Grade View: Grade III Tube type: Oral Tube size: 7.0 mm Number of attempts: 2 Airway Equipment and Method: Stylet Placement Confirmation: ETT inserted through vocal cords under direct vision,  positive ETCO2,  CO2 detector and breath sounds checked- equal and bilateral Secured at: 21 cm Tube secured with: Tape Dental Injury: Teeth and Oropharynx as per pre-operative assessment  Comments: DLx1 with MAC 3 scope, unable to visualize cords. DLx1 with glidescope MAC 3, easy atraumatic intubation.

## 2013-12-07 NOTE — Anesthesia Postprocedure Evaluation (Signed)
  Anesthesia Post-op Note  Patient: [name]  Procedure(s) Performed: Procedure(s): ROBOTIC ASSISTED TOTAL HYSTERECTOMY (N/A) BILATERAL SALPINGECTOMY (Bilateral)  Patient Location: PACU  Anesthesia Type:General  Level of Consciousness: awake, alert  and oriented  Airway and Oxygen Therapy: Patient Spontanous Breathing  Post-op Pain: mild  Post-op Assessment: Post-op Vital signs reviewed, Patient's Cardiovascular Status Stable, Respiratory Function Stable, Patent Airway, No signs of Nausea or vomiting and Pain level controlled  Post-op Vital Signs: Reviewed and stable  Last Vitals:  Filed Vitals:   12/07/13 1545  BP: 108/55  Pulse: 94  Temp:   Resp: 6    Complications: No apparent anesthesia complications

## 2013-12-07 NOTE — Transfer of Care (Signed)
Immediate Anesthesia Transfer of Care Note  Patient: [name]  Procedure(s) Performed: Procedure(s): ROBOTIC ASSISTED TOTAL HYSTERECTOMY (N/A) BILATERAL SALPINGECTOMY (Bilateral)  Patient Location: PACU  Anesthesia Type:General  Level of Consciousness: awake, alert  and oriented  Airway & Oxygen Therapy: Patient Spontanous Breathing and Patient connected to nasal cannula oxygen  Post-op Assessment: Report given to PACU RN and Post -op Vital signs reviewed and stable  Post vital signs: Reviewed and stable  Complications: No apparent anesthesia complications

## 2013-12-07 NOTE — Anesthesia Preprocedure Evaluation (Signed)
Anesthesia Evaluation  Patient identified by MRN, date of birth, ID band Patient awake    Reviewed: Allergy & Precautions, H&P , Patient's Chart, lab work & pertinent test results, reviewed documented beta blocker date and time   Airway Mallampati: II  TM Distance: >3 FB Neck ROM: full    Dental no notable dental hx.    Pulmonary  breath sounds clear to auscultation  Pulmonary exam normal       Cardiovascular - dysrhythmias (s/p ablation) Ventricular Tachycardia Rhythm:regular Rate:Normal     Neuro/Psych    GI/Hepatic GERD-  Medicated,  Endo/Other    Renal/GU      Musculoskeletal   Abdominal   Peds  Hematology   Anesthesia Other Findings   Reproductive/Obstetrics                             Anesthesia Physical Anesthesia Plan  ASA: II  Anesthesia Plan: General   Post-op Pain Management:    Induction: Intravenous  Airway Management Planned: Oral ETT  Additional Equipment:   Intra-op Plan:   Post-operative Plan: Extubation in OR  Informed Consent: I have reviewed the patients History and Physical, chart, labs and discussed the procedure including the risks, benefits and alternatives for the proposed anesthesia with the patient or authorized representative who has indicated his/her understanding and acceptance.   Dental Advisory Given and Dental advisory given  Plan Discussed with: CRNA and Surgeon  Anesthesia Plan Comments: (  Discussed general anesthesia, including possible nausea, instrumentation of airway, sore throat,pulmonary aspiration, etc. I asked if the were any outstanding questions, or  concerns before we proceeded. )        Anesthesia Quick Evaluation

## 2013-12-07 NOTE — Progress Notes (Signed)
Patient ID: [name], female   DOB: 1965-12-23, 48 y.o.   MRN: [id number] Patient seen and examined. Consent witnessed and signed. No changes noted. Update completed. CBC    Component Value Date/Time   WBC 5.8 12/06/2013 1429   WBC 5.3 08/14/2012 1015   RBC 4.52 12/06/2013 1429   RBC 4.48 10/19/2013 1010   HGB 14.3 12/06/2013 1429   HGB 13.7 10/19/2013 1010   HCT 42.7 12/06/2013 1429   HCT 42.9 10/19/2013 1010   PLT 222 12/06/2013 1429   MCV 94.5 12/06/2013 1429   MCV 95.8 10/19/2013 1010   MCH 31.6 12/06/2013 1429   MCH 30.7 10/19/2013 1010   MCHC 33.5 12/06/2013 1429   MCHC 32.0 10/19/2013 1010   RDW 12.1 12/06/2013 1429

## 2013-12-08 ENCOUNTER — Encounter (HOSPITAL_COMMUNITY): Payer: Self-pay | Admitting: Obstetrics and Gynecology

## 2013-12-08 DIAGNOSIS — D250 Submucous leiomyoma of uterus: Secondary | ICD-10-CM | POA: Diagnosis not present

## 2013-12-08 LAB — BASIC METABOLIC PANEL
Anion gap: 12 (ref 5–15)
BUN: 8 mg/dL (ref 6–23)
CHLORIDE: 103 meq/L (ref 96–112)
CO2: 26 meq/L (ref 19–32)
Calcium: 8.8 mg/dL (ref 8.4–10.5)
Creatinine, Ser: 0.71 mg/dL (ref 0.50–1.10)
GFR calc Af Amer: >90 mL/min (ref >90)
GFR calc non Af Amer: >90 mL/min (ref >90)
GLUCOSE: 126 mg/dL — AB (ref 70–99)
Potassium: 4.7 mEq/L (ref 3.7–5.3)
SODIUM: 141 meq/L (ref 137–147)

## 2013-12-08 LAB — CBC
HCT: 36.2 % (ref 36.0–46.0)
Hemoglobin: 12.2 g/dL (ref 12.0–15.0)
MCH: 31.9 pg (ref 26.0–34.0)
MCHC: 33.7 g/dL (ref 30.0–36.0)
MCV: 94.5 fL (ref 78.0–100.0)
Platelets: 210 10*3/uL (ref 150–400)
RBC: 3.83 MIL/uL — AB (ref 3.87–5.11)
RDW: 12 % (ref 11.5–15.5)
WBC: 9.6 10*3/uL (ref 4.0–10.5)

## 2013-12-08 MED ORDER — OXYCODONE-ACETAMINOPHEN 5-325 MG PO TABS: 1.0000 | ORAL_TABLET | ORAL | Status: DC | PRN

## 2013-12-08 MED ORDER — TRAMADOL HCL 50 MG PO TABS: 50.0000 mg | ORAL_TABLET | Freq: Four times a day (QID) | ORAL | Status: DC | PRN

## 2013-12-08 NOTE — Addendum Note (Signed)
Addendum  created 12/08/13 0756 by Raenette Rover, CRNA   Modules edited: Notes Section   Notes Section:  File: [id number]

## 2013-12-08 NOTE — Progress Notes (Signed)
AVS reviewed with patient.  Verbalized understanding of discharge orders, physician follow-up, wound care, and medications.  IV removed.  Site WNL.  Reports all belongings intact and in possession at time of discharge.  Patient refused wheelchair and requested to walk.  Ambulated to main entrance escorted by RN for discharge.  Patient stable at time of discharge.

## 2013-12-08 NOTE — Plan of Care (Signed)
Problem: Consults Goal: GYN POST OP OBSV 2 days Patient Education (See Patient Education module for education specifics.)  Outcome: Completed/Met Date Met:  12/08/13  Problem: Phase I Progression Outcomes Goal: Pain controlled with appropriate interventions Outcome: Completed/Met Date Met:  12/08/13 Goal: Admission history reviewed Outcome: Completed/Met Date Met:  12/08/13 Goal: Dangle/OOB as tolerated per MD order Outcome: Completed/Met Date Met:  12/08/13 Goal: VS, stable, temp < 100.4 degrees F Outcome: Completed/Met Date Met:  12/08/13 Goal: I & O every 4 hrs or as ordered Outcome: Completed/Met Date Met:  12/08/13 Goal: IS, TCDB as ordered Outcome: Completed/Met Date Met:  12/08/13  Problem: Phase II Progression Outcomes Goal: Pain controlled on oral analgesia Outcome: Completed/Met Date Met:  12/08/13 Goal: Incision/dressings dry and intact Outcome: Completed/Met Date Met:  12/08/13

## 2013-12-08 NOTE — Anesthesia Postprocedure Evaluation (Signed)
  Anesthesia Post-op Note  Patient: [name]  Procedure(s) Performed: Procedure(s): ROBOTIC ASSISTED TOTAL HYSTERECTOMY (N/A) BILATERAL SALPINGECTOMY (Bilateral)  Patient Location: [facility]  Anesthesia Type:General  Level of Consciousness: awake, alert , oriented and patient cooperative  Airway and Oxygen Therapy: Patient Spontanous Breathing  Post-op Pain: mild  Post-op Assessment: Post-op Vital signs reviewed, Patient's Cardiovascular Status Stable, Respiratory Function Stable, Patent Airway and No signs of Nausea or vomiting  Post-op Vital Signs: Reviewed and stable  Last Vitals:  Filed Vitals:   12/08/13 0507  BP: 111/64  Pulse: 82  Temp: 36.8 C  Resp: 18    Complications: No apparent anesthesia complications

## 2013-12-08 NOTE — Plan of Care (Signed)
Problem: Phase II Progression Outcomes Goal: Foley discontinued Outcome: Completed/Met Date Met:  12/08/13

## 2013-12-08 NOTE — Progress Notes (Signed)
1 Day Post-Op Procedure(s) (LRB): ROBOTIC ASSISTED TOTAL HYSTERECTOMY (N/A) BILATERAL SALPINGECTOMY (Bilateral)  Subjective: Patient reports nausea, incisional pain, tolerating PO, + flatus and no problems voiding.    Objective: I have reviewed patient's vital signs, intake and output, medications and labs. Patient Vitals for the past 24 hrs:  BP Temp Temp src Pulse Resp SpO2 Height Weight  12/08/13 0507 111/64 mmHg 98.3 F (36.8 C) Oral 82 18 97 % - -  12/08/13 0145 (!) 88/58 mmHg 98.4 F (36.9 C) Oral 89 (!) 23 97 % - -  12/07/13 2200 121/73 mmHg 98.6 F (37 C) Oral 92 18 98 % - -  12/07/13 2117 107/63 mmHg 98.2 F (36.8 C) Oral 96 18 98 % 5\' 4"  (1.626 m) 84.369 kg (186 lb)  12/07/13 2015 112/74 mmHg 97.8 F (36.6 C) Oral 95 20 98 % - -  12/07/13 1915 115/66 mmHg 97.9 F (36.6 C) Oral 97 20 98 % - -  12/07/13 1914 - - - - 12 98 % - -  12/07/13 1818 122/74 mmHg 97.8 F (36.6 C) Oral 91 14 - - -  12/07/13 1708 (!) 124/58 mmHg 97.7 F (36.5 C) Oral 94 17 95 % - -  12/07/13 1645 (!) 114/59 mmHg 98.1 F (36.7 C) Oral 97 12 99 % - -  12/07/13 1630 (!) 114/59 mmHg - - 92 10 98 % - -  12/07/13 1615 (!) 122/56 mmHg - - 93 10 98 % - -  12/07/13 1600 (!) 106/52 mmHg - - 89 (!) 7 98 % - -  12/07/13 1545 (!) 108/55 mmHg - - 94 (!) 6 99 % - -  12/07/13 1530 120/61 mmHg - - 94 (!) 6 98 % - -  12/07/13 1515 125/62 mmHg - - 94 (!) 6 98 % - -  12/07/13 1502 - - - - (!) 8 - - -  12/07/13 1500 104/79 mmHg 97.8 F (36.6 C) Oral 92 (!) 8 95 % - -  12/07/13 1132 127/65 mmHg 97.5 F (36.4 C) Oral 94 16 99 % - -   CBC    Component Value Date/Time   WBC 9.6 12/08/2013 0515   WBC 5.3 08/14/2012 1015   RBC 3.83* 12/08/2013 0515   RBC 4.48 10/19/2013 1010   HGB 12.2 12/08/2013 0515   HGB 13.7 10/19/2013 1010   HCT 36.2 12/08/2013 0515   HCT 42.9 10/19/2013 1010   PLT 210 12/08/2013 0515   MCV 94.5 12/08/2013 0515   MCV 95.8 10/19/2013 1010   MCH 31.9 12/08/2013 0515   MCH 30.7  10/19/2013 1010   MCHC 33.7 12/08/2013 0515   MCHC 32.0 10/19/2013 1010   RDW 12.0 12/08/2013 0515      General: alert, cooperative and appears stated age Resp: clear to auscultation bilaterally and normal percussion bilaterally Cardio: regular rate and rhythm, S1, S2 normal, no murmur, click, rub or gallop and normal apical impulse GI: soft, non-tender; bowel sounds normal; no masses,  no organomegaly, normal findings: aorta normal, bowel sounds normal and no organomegaly and incision: clean, dry and intact Extremities: extremities normal, atraumatic, no cyanosis or edema, Homans sign is negative, no sign of DVT and no edema, redness or tenderness in the calves or thighs Vaginal Bleeding: minimal  Assessment: s/p Procedure(s): ROBOTIC ASSISTED TOTAL HYSTERECTOMY (N/A) BILATERAL SALPINGECTOMY (Bilateral): stable, progressing well and tolerating diet  Plan: Advance diet Encourage ambulation Advance to PO medication Discontinue IV fluids Discharge home  LOS: 1 day    Sandra Malcomb  Larsen 12/08/2013, 7:18 AM

## 2013-12-13 ENCOUNTER — Other Ambulatory Visit: Payer: Self-pay | Admitting: Physician Assistant

## 2013-12-13 DIAGNOSIS — F418 Other specified anxiety disorders: Secondary | ICD-10-CM

## 2013-12-13 DIAGNOSIS — R059 Cough, unspecified: Secondary | ICD-10-CM

## 2013-12-13 DIAGNOSIS — R05 Cough: Secondary | ICD-10-CM

## 2013-12-14 MED ORDER — PANTOPRAZOLE SODIUM 40 MG PO TBEC: 40.0000 mg | DELAYED_RELEASE_TABLET | Freq: Two times a day (BID) | ORAL | Status: DC

## 2013-12-14 MED ORDER — ALPRAZOLAM 1 MG PO TABS: ORAL_TABLET | ORAL | Status: DC

## 2013-12-14 NOTE — Telephone Encounter (Signed)
Rx faxed

## 2013-12-14 NOTE — Telephone Encounter (Signed)
Patient notified via My Chart.  Meds ordered this encounter  Medications  . ALPRAZolam (XANAX) 1 MG tablet    Sig: TAKE 1 TABLET AT BEDTIME AS NEEDED FOR SLEEP    Dispense:  30 tablet    Refill:  0    Order Specific Question:  Supervising Provider    Answer:  DOOLITTLE, ROBERT P [[id number]]  . pantoprazole (PROTONIX) 40 MG tablet    Sig: Take 1 tablet (40 mg total) by mouth 2 (two) times daily.    Dispense:  180 tablet    Refill:  3    Order Specific Question:  Supervising Provider    Answer:  DOOLITTLE, ROBERT P [[id number]]

## 2013-12-22 ENCOUNTER — Encounter: Payer: 59 | Admitting: Internal Medicine

## 2014-01-02 ENCOUNTER — Encounter: Payer: Self-pay | Admitting: Physician Assistant

## 2014-01-04 MED ORDER — FLUOXETINE HCL 20 MG PO TABS: 10.0000 mg | ORAL_TABLET | Freq: Every day | ORAL | Status: DC

## 2014-01-16 ENCOUNTER — Telehealth: Payer: Self-pay | Admitting: Physician Assistant

## 2014-01-16 DIAGNOSIS — F418 Other specified anxiety disorders: Secondary | ICD-10-CM

## 2014-01-18 ENCOUNTER — Other Ambulatory Visit: Payer: Self-pay

## 2014-01-18 MED ORDER — ALPRAZOLAM 1 MG PO TABS: ORAL_TABLET | ORAL | Status: DC

## 2014-01-18 NOTE — Telephone Encounter (Signed)
Meds ordered this encounter  Medications  . ALPRAZolam (XANAX) 1 MG tablet    Sig: TAKE 1 TABLET AT BEDTIME AS NEEDED FOR SLEEP    Dispense:  30 tablet    Refill:  0    Order Specific Question:  Supervising Provider    Answer:  DOOLITTLE, ROBERT P [[id number]]

## 2014-01-18 NOTE — Telephone Encounter (Signed)
Faxed

## 2014-01-18 NOTE — Telephone Encounter (Signed)
LMOM that I faxed this for her

## 2014-01-18 NOTE — Telephone Encounter (Signed)
Pt would like a refill on ALPRAZolam (XANAX) 1 MG tablet [[id number]]. Pharm[address]. Please advise at [phone]

## 2014-02-16 ENCOUNTER — Other Ambulatory Visit: Payer: Self-pay | Admitting: Physician Assistant

## 2014-02-16 DIAGNOSIS — F418 Other specified anxiety disorders: Secondary | ICD-10-CM

## 2014-02-18 NOTE — Telephone Encounter (Signed)
Patient will be out of her medication Sunday, Feb 19, 2014

## 2014-02-19 ENCOUNTER — Telehealth: Payer: Self-pay | Admitting: Radiology

## 2014-02-19 MED ORDER — ALPRAZOLAM 1 MG PO TABS: ORAL_TABLET | ORAL | Status: DC

## 2014-02-19 NOTE — Telephone Encounter (Signed)
Ready.  [name] will call into the pharmacy.  She will also call the patient and let her know.

## 2014-02-19 NOTE — Telephone Encounter (Signed)
left vm to inform of X[facility] being [facility] [address]

## 2014-02-19 NOTE — Telephone Encounter (Signed)
Patient called and states that she is out of her Alprazolam. She states that she sent a request in via MyChart but has not heard anything yet. Patient is going to [place] this evening. Can she have this refilled today if possibile?   [phone]

## 2014-02-19 NOTE — Telephone Encounter (Signed)
Judson Roch, can you look at this for [place]? Thanks

## 2014-03-17 ENCOUNTER — Telehealth: Payer: Self-pay | Admitting: Physician Assistant

## 2014-03-17 DIAGNOSIS — F418 Other specified anxiety disorders: Secondary | ICD-10-CM

## 2014-03-18 ENCOUNTER — Encounter: Payer: Self-pay | Admitting: Physician Assistant

## 2014-03-19 NOTE — Telephone Encounter (Signed)
Pt called requesting a refill of xanax,

## 2014-03-20 MED ORDER — ALPRAZOLAM 1 MG PO TABS: ORAL_TABLET | ORAL | Status: DC

## 2014-03-20 NOTE — Telephone Encounter (Signed)
Ready

## 2014-03-21 NOTE — Telephone Encounter (Signed)
Faxed and pt notified on MyChart

## 2014-04-18 ENCOUNTER — Other Ambulatory Visit: Payer: Self-pay

## 2014-04-18 DIAGNOSIS — F418 Other specified anxiety disorders: Secondary | ICD-10-CM

## 2014-04-18 NOTE — Telephone Encounter (Signed)
Pt reqs RF of alprazolam. She will be bringing her daughter in tomorrow for OV.

## 2014-04-19 MED ORDER — ALPRAZOLAM 1 MG PO TABS: ORAL_TABLET | ORAL | Status: DC

## 2014-04-19 NOTE — Telephone Encounter (Signed)
Meds ordered this encounter  Medications  . ALPRAZolam (XANAX) 1 MG tablet    Sig: TAKE 1 TABLET AT BEDTIME AS NEEDED FOR SLEEP.    Dispense:  30 tablet    Refill:  0    

## 2014-04-20 NOTE — Telephone Encounter (Signed)
rx faxed

## 2014-05-07 ENCOUNTER — Other Ambulatory Visit: Payer: Self-pay | Admitting: Physician Assistant

## 2014-05-18 ENCOUNTER — Telehealth: Payer: Self-pay | Admitting: Physician Assistant

## 2014-05-18 DIAGNOSIS — F418 Other specified anxiety disorders: Secondary | ICD-10-CM

## 2014-05-18 NOTE — Telephone Encounter (Signed)
Alprazolam refill  [phone]

## 2014-05-19 MED ORDER — ALPRAZOLAM 1 MG PO TABS: ORAL_TABLET | ORAL | Status: DC

## 2014-05-19 NOTE — Telephone Encounter (Signed)
Rx called in 

## 2014-05-19 NOTE — Telephone Encounter (Signed)
Done

## 2014-06-15 ENCOUNTER — Telehealth: Payer: Self-pay | Admitting: Physician Assistant

## 2014-06-15 DIAGNOSIS — F418 Other specified anxiety disorders: Secondary | ICD-10-CM

## 2014-06-15 NOTE — Telephone Encounter (Signed)
Alprazolam refill  [phone]

## 2014-06-16 MED ORDER — ALPRAZOLAM 1 MG PO TABS: ORAL_TABLET | ORAL | Status: DC

## 2014-06-16 NOTE — Telephone Encounter (Signed)
Notified pt on VM that I faxed Rx.

## 2014-06-16 NOTE — Telephone Encounter (Signed)
Meds ordered this encounter  Medications  . ALPRAZolam (XANAX) 1 MG tablet    Sig: TAKE 1 TABLET AT BEDTIME AS NEEDED FOR SLEEP    Dispense:  30 tablet    Refill:  0    Order Specific Question:  Supervising Provider    Answer:  DOOLITTLE, ROBERT P [[id number]]

## 2014-07-14 ENCOUNTER — Telehealth: Payer: Self-pay | Admitting: Physician Assistant

## 2014-07-14 DIAGNOSIS — F418 Other specified anxiety disorders: Secondary | ICD-10-CM

## 2014-07-14 MED ORDER — ALPRAZOLAM 1 MG PO TABS: ORAL_TABLET | ORAL | Status: DC

## 2014-07-14 NOTE — Addendum Note (Signed)
Addended by: Fara Chute on: 07/14/2014 01:52 PM   Modules accepted: Orders

## 2014-07-14 NOTE — Telephone Encounter (Signed)
Rx faxed

## 2014-07-14 NOTE — Telephone Encounter (Signed)
Meds ordered this encounter  Medications  . ALPRAZolam (XANAX) 1 MG tablet    Sig: TAKE 1 TABLET AT BEDTIME AS NEEDED FOR SLEEP    Dispense:  30 tablet    Refill:  0    Order Specific Question:  Supervising Provider    Answer:  DOOLITTLE, ROBERT P [[id number]]

## 2014-07-14 NOTE — Telephone Encounter (Signed)
Alprazolam refill  [phone]

## 2014-07-17 ENCOUNTER — Other Ambulatory Visit: Payer: Self-pay | Admitting: Physician Assistant

## 2014-08-08 ENCOUNTER — Other Ambulatory Visit: Payer: Self-pay | Admitting: Physician Assistant

## 2014-08-14 ENCOUNTER — Telehealth: Payer: Self-pay

## 2014-08-14 DIAGNOSIS — F418 Other specified anxiety disorders: Secondary | ICD-10-CM

## 2014-08-14 NOTE — Telephone Encounter (Signed)
Pt in need of her ALPRAZOLAM 1 MG. Please call [phone] when ready

## 2014-08-16 MED ORDER — ALPRAZOLAM 1 MG PO TABS: ORAL_TABLET | ORAL | Status: DC

## 2014-08-16 NOTE — Telephone Encounter (Signed)
Done

## 2014-08-16 NOTE — Telephone Encounter (Signed)
Faxed and pt notified.

## 2014-09-12 ENCOUNTER — Other Ambulatory Visit: Payer: Self-pay | Admitting: Internal Medicine

## 2014-09-15 ENCOUNTER — Telehealth: Payer: Self-pay

## 2014-09-15 DIAGNOSIS — F418 Other specified anxiety disorders: Secondary | ICD-10-CM

## 2014-09-15 NOTE — Telephone Encounter (Signed)
Last OV 10/19/2013

## 2014-09-15 NOTE — Telephone Encounter (Signed)
Pt is needing a refill on alprazalem refilled

## 2014-09-17 NOTE — Telephone Encounter (Signed)
PT req. Refill status update for  ALPRAZolam [name]) 1 MG tablet [[id number]]  [phone]

## 2014-09-18 MED ORDER — ALPRAZOLAM 1 MG PO TABS: ORAL_TABLET | ORAL | Status: DC

## 2014-09-18 NOTE — Telephone Encounter (Signed)
Rx has been faxed and pt aware.

## 2014-09-18 NOTE — Telephone Encounter (Signed)
Meds ordered this encounter  Medications  . ALPRAZolam (XANAX) 1 MG tablet    Sig: TAKE 1 TABLET AT BEDTIME AS NEEDED FOR SLEEP    Dispense:  30 tablet    Refill:  0    Order Specific Question:  Supervising Provider    Answer:  DOOLITTLE, ROBERT P [[id number]]    

## 2014-10-14 ENCOUNTER — Telehealth: Payer: Self-pay | Admitting: Physician Assistant

## 2014-10-14 DIAGNOSIS — F418 Other specified anxiety disorders: Secondary | ICD-10-CM

## 2014-10-17 MED ORDER — ALPRAZOLAM 1 MG PO TABS: ORAL_TABLET | ORAL | Status: DC

## 2014-10-17 NOTE — Addendum Note (Signed)
Addended by: Fara Chute on: 10/17/2014 08:27 AM   Modules accepted: Orders

## 2014-10-17 NOTE — Telephone Encounter (Signed)
Patient notified via My Chart.  Rx printed at 104. Will bring to 102 after clinic.  Meds ordered this encounter  Medications  . ALPRAZolam (XANAX) 1 MG tablet    Sig: TAKE 1 TABLET AT BEDTIME AS NEEDED FOR SLEEP    Dispense:  30 tablet    Refill:  0    Order Specific Question:  Supervising Provider    Answer:  DOOLITTLE, ROBERT P [[id number]]

## 2014-10-17 NOTE — Telephone Encounter (Signed)
Rx faxed

## 2014-11-13 ENCOUNTER — Telehealth: Payer: Self-pay | Admitting: Physician Assistant

## 2014-11-13 DIAGNOSIS — F418 Other specified anxiety disorders: Secondary | ICD-10-CM

## 2014-11-14 MED ORDER — ALPRAZOLAM 1 MG PO TABS: ORAL_TABLET | ORAL | Status: DC

## 2014-11-14 NOTE — Telephone Encounter (Signed)
Patient notified via My Chart.  Rx printed at 104. Will bring to 102 after clinic.  Meds ordered this encounter  Medications  . ALPRAZolam (XANAX) 1 MG tablet    Sig: TAKE 1 TABLET AT BEDTIME AS NEEDED FOR SLEEP    Dispense:  30 tablet    Refill:  0    Order Specific Question:  Supervising Provider    Answer:  DOOLITTLE, ROBERT P [[id number]]

## 2014-11-14 NOTE — Addendum Note (Signed)
Addended by: Fara Chute on: 11/14/2014 10:23 AM   Modules accepted: Orders

## 2014-12-13 ENCOUNTER — Other Ambulatory Visit: Payer: Self-pay | Admitting: Physician Assistant

## 2014-12-15 ENCOUNTER — Telehealth: Payer: Self-pay

## 2014-12-15 NOTE — Telephone Encounter (Signed)
Pt would like her ALPRAZolam (XANAX) 1 MG tablet [id number] script sent [facility] [address] We have already filled it, she just wants it faxed. CB #   [phone]

## 2014-12-16 NOTE — Telephone Encounter (Signed)
Pt called in checking on status of this message, her pharmacy closes at 6p

## 2014-12-17 ENCOUNTER — Other Ambulatory Visit: Payer: Self-pay | Admitting: Physician Assistant

## 2014-12-17 ENCOUNTER — Other Ambulatory Visit: Payer: Self-pay | Admitting: Radiology

## 2014-12-17 DIAGNOSIS — F418 Other specified anxiety disorders: Secondary | ICD-10-CM

## 2014-12-17 MED ORDER — ALPRAZOLAM 1 MG PO TABS: ORAL_TABLET | ORAL | Status: DC

## 2014-12-17 NOTE — Telephone Encounter (Signed)
I will provide 30 days.  This should give her enough time to get back to [place].

## 2014-12-17 NOTE — Telephone Encounter (Addendum)
Patient called again concerning her Xanax medication. Spoke with Philis Fendt, PA-C and he will refill 30 days worth for the patient. She is scheduled to see [name] on 01/02/2015.

## 2014-12-17 NOTE — Telephone Encounter (Signed)
[name] has written one month supply, it has been faxed

## 2014-12-17 NOTE — Telephone Encounter (Signed)
Patient has appt on 01/02/15 she has not been here since 10/2013 Have routed to [facility] to review.

## 2014-12-17 NOTE — Telephone Encounter (Signed)
Xanax has been faxed

## 2014-12-17 NOTE — Telephone Encounter (Signed)
Pt called again to follow up on previous request. She is completely out of the xanax and is waiting for the refill to be approved by [name]. Pt wants to know if another PA who is here today can approve this because she needs it to sleep at night. [facility] [address] Pt # [phone]

## 2014-12-24 ENCOUNTER — Other Ambulatory Visit: Payer: Self-pay | Admitting: Physician Assistant

## 2015-01-02 ENCOUNTER — Encounter: Payer: Self-pay | Admitting: Physician Assistant

## 2015-01-02 ENCOUNTER — Ambulatory Visit (INDEPENDENT_AMBULATORY_CARE_PROVIDER_SITE_OTHER): Payer: 59 | Admitting: Physician Assistant

## 2015-01-02 VITALS — BP 132/79 | HR 84 | Temp 97.8°F | Resp 16 | Ht 64.0 in | Wt 182.0 lb

## 2015-01-02 DIAGNOSIS — R635 Abnormal weight gain: Secondary | ICD-10-CM | POA: Diagnosis not present

## 2015-01-02 DIAGNOSIS — E785 Hyperlipidemia, unspecified: Secondary | ICD-10-CM | POA: Diagnosis not present

## 2015-01-02 DIAGNOSIS — Z23 Encounter for immunization: Secondary | ICD-10-CM

## 2015-01-02 DIAGNOSIS — Z0000 Encounter for general adult medical examination without abnormal findings: Secondary | ICD-10-CM

## 2015-01-02 DIAGNOSIS — F418 Other specified anxiety disorders: Secondary | ICD-10-CM | POA: Diagnosis not present

## 2015-01-02 DIAGNOSIS — Z1211 Encounter for screening for malignant neoplasm of colon: Secondary | ICD-10-CM | POA: Diagnosis not present

## 2015-01-02 DIAGNOSIS — Z130 Encounter for screening for diseases of the blood and blood-forming organs and certain disorders involving the immune mechanism: Secondary | ICD-10-CM | POA: Diagnosis not present

## 2015-01-02 DIAGNOSIS — Z114 Encounter for screening for human immunodeficiency virus [HIV]: Secondary | ICD-10-CM

## 2015-01-02 DIAGNOSIS — Z1159 Encounter for screening for other viral diseases: Secondary | ICD-10-CM | POA: Diagnosis not present

## 2015-01-02 LAB — CBC WITH DIFFERENTIAL/PLATELET
BASOS ABS: 0 10*3/uL (ref 0.0–0.1)
BASOS PCT: 1 % (ref 0–1)
EOS ABS: 0.2 10*3/uL (ref 0.0–0.7)
Eosinophils Relative: 5 % (ref 0–5)
HCT: 42.6 % (ref 36.0–46.0)
Hemoglobin: 14.1 g/dL (ref 12.0–15.0)
Lymphocytes Relative: 32 % (ref 12–46)
Lymphs Abs: 1.5 10*3/uL (ref 0.7–4.0)
MCH: 30.4 pg (ref 26.0–34.0)
MCHC: 33.1 g/dL (ref 30.0–36.0)
MCV: 91.8 fL (ref 78.0–100.0)
MPV: 9.2 fL (ref 8.6–12.4)
Monocytes Absolute: 0.5 10*3/uL (ref 0.1–1.0)
Monocytes Relative: 10 % (ref 3–12)
NEUTROS PCT: 52 % (ref 43–77)
Neutro Abs: 2.5 10*3/uL (ref 1.7–7.7)
PLATELETS: 253 10*3/uL (ref 150–400)
RBC: 4.64 MIL/uL (ref 3.87–5.11)
RDW: 13.1 % (ref 11.5–15.5)
WBC: 4.8 10*3/uL (ref 4.0–10.5)

## 2015-01-02 LAB — T3, FREE: T3, Free: 2.7 pg/mL (ref 2.3–4.2)

## 2015-01-02 LAB — COMPREHENSIVE METABOLIC PANEL
ALK PHOS: 60 U/L (ref 33–130)
ALT: 16 U/L (ref 6–29)
AST: 18 U/L (ref 10–35)
Albumin: 4.5 g/dL (ref 3.6–5.1)
BUN: 18 mg/dL (ref 7–25)
CHLORIDE: 100 mmol/L (ref 98–110)
CO2: 27 mmol/L (ref 20–31)
CREATININE: 0.76 mg/dL (ref 0.50–1.05)
Calcium: 9.5 mg/dL (ref 8.6–10.4)
Glucose, Bld: 86 mg/dL (ref 65–99)
Potassium: 4.3 mmol/L (ref 3.5–5.3)
SODIUM: 138 mmol/L (ref 135–146)
TOTAL PROTEIN: 7.3 g/dL (ref 6.1–8.1)
Total Bilirubin: 0.6 mg/dL (ref 0.2–1.2)

## 2015-01-02 LAB — LIPID PANEL
Cholesterol: 278 mg/dL — ABNORMAL HIGH (ref 125–200)
HDL: 69 mg/dL (ref >=46)
LDL CALC: 193 mg/dL — AB (ref <130)
Total CHOL/HDL Ratio: 4 Ratio (ref <=5.0)
Triglycerides: 80 mg/dL (ref <150)
VLDL: 16 mg/dL (ref <30)

## 2015-01-02 LAB — T4, FREE: FREE T4: 1.15 ng/dL (ref 0.80–1.80)

## 2015-01-02 LAB — TSH: TSH: 2.508 u[IU]/mL (ref 0.350–4.500)

## 2015-01-02 MED ORDER — ALPRAZOLAM 1 MG PO TABS: 1.0000 mg | ORAL_TABLET | Freq: Every evening | ORAL | Status: DC | PRN

## 2015-01-02 MED ORDER — ALPRAZOLAM 1 MG PO TABS: ORAL_TABLET | ORAL | Status: DC

## 2015-01-02 NOTE — Progress Notes (Signed)
Patient ID: [name], female    DOB: 07/29/1965, 49 y.o.   MRN: [id number]  PCP: Kennan Detter, PA-C  Chief Complaint  Patient presents with  . Annual Exam    Subjective:   HPI: Presents for annual exam.  [place] and pelvic exam with GYN. Hysterectomy 12/2013. Due for colonoscopy. Mammogram is current. Tdap 12/2206  Only concern today is weight gain. She stopped both atorvastatin and fluoxetine, and has lost 5 lbs. She doesn't exercise daily, and she makes good eating choices. Frustrated that calorie reduction hasn't mad a more substantial impact. We reviewed her chart together, looking at the weight over time 12/2013 186 12/2012 175 12/2011 167 01/2010  166 09/2008  150 06/2007  133 12/2006 149 (recalls weight increasing after her father died) 2006  124   Patient Active Problem List   Diagnosis Date Noted  . Fibroids, submucosal 12/07/2013  . Depression with anxiety 10/19/2013  . Cough 10/19/2013  . Mild obesity 09/07/2013  . Hyperlipidemia 10/04/2008  . VENTRICULAR TACHYCARDIA 10/04/2008  . RLQ PAIN 06/07/2007  . CARPAL TUNNEL SYNDROME, BILATERAL 05/06/2007  . MITRAL VALVE DISORDERS 05/06/2007  . ABNORMAL HEART RHYTHMS 05/06/2007  . GERD 05/06/2007  . HIATAL HERNIA 05/06/2007  . IBS 05/06/2007  . PLANTAR FASCIITIS 05/06/2007    Past Medical History  Diagnosis Date  . Insomnia   . Arrhythmia ventricular     s/p ablation 2001  . Menorrhagia   . Uterine fibroid     small  . Mitral valve disorder   . Ventricular tachycardia ([place])   . Chronic RLQ pain   . IBS (irritable bowel syndrome)   . Hiatal hernia   . GERD (gastroesophageal reflux disease)   . Plantar fasciitis   . Carpal tunnel syndrome, bilateral   . Dyslipidemia   . Anxiety   . History of migraine headaches   . Dysrhythmia     V- tach- resolved with ablation 2002  . MVP (mitral valve prolapse)   . Headache   . PONV (postoperative nausea and vomiting)      Prior to  Admission medications   Medication Sig Start Date End Date Taking? Authorizing Provider  ALPRAZolam [name]) 1 MG tablet TAKE 1 TABLET AT BEDTIME AS NEEDED FOR SLEEP. 01/02/15  Yes Aanika Defoor, PA-C  flecainide (TAMBOCOR) 50 MG tablet TAKE 1 TABLET BY MOUTH 2 TIMES DAILY 09/12/14  Yes Evans Lance, MD  ipratropium (ATROVENT) 0.03 % nasal spray SPRAY TWICE IN EACH NOSTRIL TWICE A DAY   Yes Crawford Tamura, PA-C  pantoprazole (PROTONIX) 40 MG tablet TAKE 1 TABLET (40 MG TOTAL) BY MOUTH 2 (TWO) TIMES DAILY. 12/25/14  Yes Amariss Detamore, PA-C  ALPRAZolam (XANAX) 1 MG tablet Take 1 tablet (1 mg total) by mouth at bedtime as needed for sleep. 01/02/15   Laure Leone, PA-C  ALPRAZolam [name]) 1 MG tablet Take 1 tablet (1 mg total) by mouth at bedtime as needed for sleep. 01/02/15   Jessice Madill, PA-C  atorvastatin (LIPITOR) 20 MG tablet TAKE 1 TABLET BY MOUTH EVERY DAY Patient not taking: Reported on 01/02/2015 12/25/14   Khye Hochstetler, PA-C  FLUoxetine (PROZAC) 20 MG tablet TAKE 1 TABLET DAILY.  "NO MORE REFILLS WITHOUT OV VISIT" Patient not taking: Reported on 01/02/2015 05/09/14   Harrison Mons, PA-C    Allergies  Allergen Reactions  . Covera-Hs [Verapamil]     Blood blisters, Stevens-Johnson syndrome  . Crestor [Rosuvastatin Calcium]     Flu like symptoms, on different statin  Past Surgical History  Procedure Laterality Date  . Patella arthroplasty    . Cspine  05/1999    c5-6  . Echocardiogram  2000/2001  . Spine surgery    . Cardiac electrophysiology mapping and ablation  2002  . Knee surgery  1998  . Robotic assisted total hysterectomy N/A 12/07/2013    Procedure: ROBOTIC ASSISTED TOTAL HYSTERECTOMY;  Surgeon: Lovenia Kim, MD;  Location: [facility];  Service: Gynecology;  Laterality: N/A;  . Bilateral salpingectomy Bilateral 12/07/2013    Procedure: BILATERAL SALPINGECTOMY;  Surgeon: Lovenia Kim, MD;  Location: [facility];  Service: Gynecology;  Laterality: Bilateral;  .  Abdominal hysterectomy      Family History  Problem Relation Age of Onset  . Hiatal hernia Mother   . Irritable bowel syndrome Mother   . Hypertension Mother   . Thyroid disease Mother   . Heart disease Mother 52    AMI, 90% LAD, stent placement  . Colon cancer Neg Hx   . Cancer Father     lung (+ TOBACCO)  . Hypertension Sister   . Seizures Sister   . Headache Sister     Social History   Social History  . Marital Status: Married    Spouse Name: [name]  . Number of Children: 2  . Years of Education: 16   Occupational History  . Oncologist    Social History Main Topics  . Smoking status: Never Smoker   . Smokeless tobacco: Never Used  . Alcohol Use: 3.0 oz/week    5 Glasses of wine per week  . Drug Use: No  . Sexual Activity:    Partners: Male    Birth Control/ Protection: None     Comment: h/o infertility   Other Topics Concern  . None   Social History Narrative   Patient is married [name]) lives with her husband (2nd marriage, [employer]). Two daughters live with them.  Oldest daughter is grown and lives locally with her husband and children.   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   Patient is right-handed.   Patient drinks one cup of caffeine daily.       Review of Systems  Constitutional: Positive for unexpected weight change.  Eyes: Negative.   Respiratory: Positive for cough. Negative for apnea, choking, chest tightness, shortness of breath, wheezing and stridor.   Cardiovascular: Negative.   Gastrointestinal: Negative.   Endocrine: Positive for cold intolerance, heat intolerance and polydipsia (only at night).  Genitourinary: Negative.   Musculoskeletal: Positive for arthralgias (knees), neck pain (known osteoarthritis) and neck stiffness. Negative for myalgias, back pain, joint swelling and gait problem.  Skin: Negative.   Allergic/Immunologic: Negative.     Neurological: Positive for headaches (migraine). Negative for dizziness, tremors, seizures, syncope, facial asymmetry, speech difficulty, weakness, light-headedness and numbness.  Hematological: Negative.   Psychiatric/Behavioral: Negative.         Objective:  Physical Exam  Constitutional: She is oriented to person, place, and time. She appears well-developed and well-nourished. She is active and cooperative. No distress.  BP 132/79 mmHg  Pulse 84  Temp(Src) 97.8 F (36.6 C)  Resp 16  Ht 5\' 4"  (1.626 m)  Wt 182 lb (82.555 kg)  BMI 31.22 kg/m2  LMP 12/03/2014   HENT:  Head: Normocephalic and atraumatic.  Right Ear: Hearing, tympanic membrane, external ear and ear canal normal. No foreign bodies.  Left Ear: Hearing, tympanic membrane, external ear and ear canal normal. No foreign bodies.  Nose: Nose normal.  Mouth/Throat: Uvula is midline, oropharynx is clear and moist and mucous membranes are normal. No oral lesions. Normal dentition. No dental abscesses or uvula swelling. No oropharyngeal exudate.  Eyes: Conjunctivae, EOM and lids are normal. Pupils are equal, round, and reactive to light. Right eye exhibits no discharge. Left eye exhibits no discharge. No scleral icterus.  Fundoscopic exam:      The right eye shows no arteriolar narrowing, no AV nicking, no exudate, no hemorrhage and no papilledema. The right eye shows red reflex.       The left eye shows no arteriolar narrowing, no AV nicking, no exudate, no hemorrhage and no papilledema. The left eye shows red reflex.  Neck: Trachea normal, normal range of motion and full passive range of motion without pain. Neck supple. No spinous process tenderness and no muscular tenderness present. No thyroid mass and no thyromegaly present.  Cardiovascular: Normal rate, regular rhythm, normal heart sounds, intact distal pulses and normal pulses.   Pulmonary/Chest: Effort normal and breath sounds normal.  Musculoskeletal: She exhibits no  edema or tenderness.       Cervical back: Normal.       Thoracic back: Normal.       Lumbar back: Normal.  Lymphadenopathy:       Head (right side): No tonsillar, no preauricular, no posterior auricular and no occipital adenopathy present.       Head (left side): No tonsillar, no preauricular, no posterior auricular and no occipital adenopathy present.    She has no cervical adenopathy.       Right: No supraclavicular adenopathy present.       Left: No supraclavicular adenopathy present.  Neurological: She is alert and oriented to person, place, and time. She has normal strength and normal reflexes. No cranial nerve deficit. She exhibits normal muscle tone. Coordination and gait normal.  Skin: Skin is warm, dry and intact. No rash noted. She is not diaphoretic. No cyanosis or erythema. Nails show no clubbing.  Psychiatric: She has a normal mood and affect. Her speech is normal and behavior is normal. Judgment and thought content normal.           Assessment & Plan:  1. Annual physical exam Age appropriate anticipatory guidance provided.  2. Need for prophylactic vaccination and inoculation against influenza - Flu Vaccine QUAD 36+ mos IM  3. Screening for deficiency anemia - CBC with Differential/Platelet  4. Screening for colon cancer - Ambulatory referral to Gastroenterology  5. Screening for HIV (human immunodeficiency virus) - HIV antibody  6. Need for hepatitis C screening test - Hepatitis C antibody  7. Hyperlipidemia Anticipate need to restart statin. If weight gain recurs, will stop it and consider Welchol. - Comprehensive metabolic panel - Lipid panel  8. Weight gain Await lab results. Counseled on healthy eating and increasing her exercise. Increase plants in the diet. Reduce animal proteins. - TSH - T3, Free - T4, Free  9. Depression with anxiety Stable off fluoxetine. Continue PRN alprazolam for insomnia. - ALPRAZolam (XANAX) 1 MG tablet; TAKE 1 TABLET  AT BEDTIME AS NEEDED FOR SLEEP.  Dispense: 30 tablet; Refill: 0 - ALPRAZolam (XANAX) 1 MG tablet; Take 1 tablet (1 mg total) by mouth at bedtime as needed for sleep.  Dispense: 30 tablet; Refill: 0 - ALPRAZolam (XANAX) 1 MG tablet; Take 1 tablet (1 mg total) by mouth at bedtime as needed  for sleep.  Dispense: 30 tablet; Refill: 0   Fara Chute, PA-C Physician Assistant-Certified [facility]

## 2015-01-02 NOTE — Patient Instructions (Signed)
I will contact you with your lab results as soon as they are available.   If you have not heard from me in 2 weeks, please contact me.  The fastest way to get your results is to register for My Chart (see the instructions on the last page of this printout).  Keeping You Healthy  Get These Tests  Blood Pressure- Have your blood pressure checked by your healthcare provider at least once a year.  Normal blood pressure is 120/80.  Weight- Have your body mass index (BMI) calculated to screen for obesity.  BMI is a measure of body fat based on height and weight.  You can calculate your own BMI at www.nhlbisupport.com/bmi/  Cholesterol- Have your cholesterol checked every year.  Diabetes- Have your blood sugar checked every year if you have high blood pressure, high cholesterol, a family history of diabetes or if you are overweight.  Pap Test - Have a pap test every 1 to 5 years if you have been sexually active.  If you are older than 65 and recent pap tests have been normal you may not need additional pap tests.  In addition, if you have had a hysterectomy  for benign disease additional pap tests are not necessary.  Mammogram-Yearly mammograms are essential for early detection of breast cancer  Screening for Colon Cancer- Colonoscopy starting at age 50. Screening may begin sooner depending on your family history and other health conditions.  Follow up colonoscopy as directed by your Gastroenterologist.  Screening for Osteoporosis- Screening begins at age 65 with bone density scanning, sooner if you are at higher risk for developing Osteoporosis.  Get these medicines  Calcium with Vitamin D- Your body requires 1200-1500 mg of Calcium a day and 800-1000 IU of Vitamin D a day.  You can only absorb 500 mg of Calcium at a time therefore Calcium must be taken in 2 or 3 separate doses throughout the day.  Hormones- Hormone therapy has been associated with increased risk for certain cancers and heart  disease.  Talk to your healthcare provider about if you need relief from menopausal symptoms.  Aspirin- Ask your healthcare provider about taking Aspirin to prevent Heart Disease and Stroke.  Get these Immuniztions  Flu shot- Every fall  Pneumonia shot- Once after the age of 65; if you are younger ask your healthcare provider if you need a pneumonia shot.  Tetanus- Every ten years.  Zostavax- Once after the age of 60 to prevent shingles.  Take these steps  Don't smoke- Your healthcare provider can help you quit. For tips on how to quit, ask your healthcare provider or go to www.smokefree.gov or call 1-800 QUIT-NOW.  Be physically active- Exercise 5 days a week for a minimum of 30 minutes.  If you are not already physically active, start slow and gradually work up to 30 minutes of moderate physical activity.  Try walking, dancing, bike riding, swimming, etc.  Eat a healthy diet- Eat a variety of healthy foods such as fruits, vegetables, whole grains, low fat milk, low fat cheeses, yogurt, lean meats, chicken, fish, eggs, dried beans, tofu, etc.  For more information go to www.thenutritionsource.org  Dental visit- Brush and floss teeth twice daily; visit your dentist twice a year.  Eye exam- Visit your Optometrist or Ophthalmologist yearly.  Drink alcohol in moderation- Limit alcohol intake to one drink or less a day.  Never drink and drive.  Depression- Your emotional health is as important as your physical health.  If you're   feeling down or losing interest in things you normally enjoy, please talk to your healthcare provider.  Seat Belts- can save your life; always wear one  Smoke/Carbon Monoxide detectors- These detectors need to be installed on the appropriate level of your home.  Replace batteries at least once a year.  Violence- If anyone is threatening or hurting you, please tell your healthcare provider.  Living Will/ Health care power of attorney- Discuss with your  healthcare provider and family. 

## 2015-01-03 LAB — HEPATITIS C ANTIBODY: HCV Ab: NEGATIVE

## 2015-01-03 LAB — HIV ANTIBODY (ROUTINE TESTING W REFLEX): HIV: NONREACTIVE

## 2015-03-27 ENCOUNTER — Encounter: Payer: Self-pay | Admitting: Physician Assistant

## 2015-03-27 DIAGNOSIS — F418 Other specified anxiety disorders: Secondary | ICD-10-CM

## 2015-03-29 NOTE — Telephone Encounter (Signed)
Please hold for [name] so she can write the Rx when she returns.

## 2015-04-04 MED ORDER — ALPRAZOLAM 1 MG PO TABS: ORAL_TABLET | ORAL | Status: DC

## 2015-04-04 MED ORDER — ALPRAZOLAM 1 MG PO TABS
1.0000 mg | ORAL_TABLET | Freq: Every evening | ORAL | Status: DC | PRN
Start: 2015-04-04 — End: 2015-06-22

## 2015-04-04 NOTE — Telephone Encounter (Signed)
Patient notified via My Chart.  Meds ordered this encounter  Medications  . ALPRAZolam (XANAX) 1 MG tablet    Sig: TAKE 1 TABLET AT BEDTIME AS NEEDED FOR SLEEP.    Dispense:  30 tablet    Refill:  0    Order Specific Question:  Supervising Provider    Answer:  DOOLITTLE, ROBERT P [id number]  . ALPRAZolam (XANAX) 1 MG tablet    Sig: Take 1 tablet (1 mg total) by mouth at bedtime as needed for sleep.    Dispense:  30 tablet    Refill:  0    May fill 30 days after date on prescription    Order Specific Question:  Supervising Provider    Answer:  DOOLITTLE, ROBERT P [id number]  . ALPRAZolam (XANAX) 1 MG tablet    Sig: Take 1 tablet (1 mg total) by mouth at bedtime as needed for sleep.    Dispense:  30 tablet    Refill:  0    May fill 60 days from date on prescription    Order Specific Question:  Supervising Provider    Answer:  Tami Lin P Bell

## 2015-04-05 NOTE — Telephone Encounter (Signed)
Rxs are in drawer for p/up.

## 2015-04-08 ENCOUNTER — Ambulatory Visit (INDEPENDENT_AMBULATORY_CARE_PROVIDER_SITE_OTHER): Payer: 59 | Admitting: Family Medicine

## 2015-04-08 VITALS — BP 122/72 | HR 110 | Temp 98.7°F | Resp 17 | Ht 65.5 in | Wt 173.0 lb

## 2015-04-08 DIAGNOSIS — R509 Fever, unspecified: Secondary | ICD-10-CM | POA: Diagnosis not present

## 2015-04-08 DIAGNOSIS — W57XXXA Bitten or stung by nonvenomous insect and other nonvenomous arthropods, initial encounter: Secondary | ICD-10-CM

## 2015-04-08 LAB — POCT CBC
GRANULOCYTE PERCENT: 84.9 % — AB (ref 37–80)
HEMATOCRIT: 38.1 % (ref 37.7–47.9)
HEMOGLOBIN: 13.8 g/dL (ref 12.2–16.2)
LYMPH, POC: 0.8 (ref 0.6–3.4)
MCH, POC: 32.1 pg — AB (ref 27–31.2)
MCHC: 36.1 g/dL — AB (ref 31.8–35.4)
MCV: 88.8 fL (ref 80–97)
MID (CBC): 0.4 (ref 0–0.9)
MPV: 6.8 fL (ref 0–99.8)
PLATELET COUNT, POC: 188 10*3/uL (ref 142–424)
POC Granulocyte: 6.6 (ref 2–6.9)
POC LYMPH PERCENT: 9.8 %L — AB (ref 10–50)
POC MID %: 5.3 % (ref 0–12)
RBC: 4.29 M/uL (ref 4.04–5.48)
RDW, POC: 12.5 %
WBC: 7.8 10*3/uL (ref 4.6–10.2)

## 2015-04-08 LAB — POCT INFLUENZA A/B
INFLUENZA A, POC: NEGATIVE
INFLUENZA B, POC: NEGATIVE

## 2015-04-08 LAB — COMPREHENSIVE METABOLIC PANEL
ALBUMIN: 4.5 g/dL (ref 3.6–5.1)
ALT: 16 U/L (ref 6–29)
AST: 15 U/L (ref 10–35)
Alkaline Phosphatase: 59 U/L (ref 33–130)
BUN: 10 mg/dL (ref 7–25)
CHLORIDE: 102 mmol/L (ref 98–110)
CO2: 26 mmol/L (ref 20–31)
Calcium: 9.4 mg/dL (ref 8.6–10.4)
Creat: 0.67 mg/dL (ref 0.50–1.05)
Glucose, Bld: 105 mg/dL — ABNORMAL HIGH (ref 65–99)
POTASSIUM: 4.5 mmol/L (ref 3.5–5.3)
Sodium: 140 mmol/L (ref 135–146)
TOTAL PROTEIN: 6.8 g/dL (ref 6.1–8.1)
Total Bilirubin: 0.9 mg/dL (ref 0.2–1.2)

## 2015-04-08 LAB — POCT SEDIMENTATION RATE: POCT SED RATE: 32 mm/hr — AB (ref 0–22)

## 2015-04-08 LAB — C-REACTIVE PROTEIN: CRP: 7.4 mg/dL — ABNORMAL HIGH (ref <0.60)

## 2015-04-08 MED ORDER — DOXYCYCLINE HYCLATE 100 MG PO CAPS: 100.0000 mg | ORAL_CAPSULE | Freq: Two times a day (BID) | ORAL | Status: DC

## 2015-04-08 NOTE — Progress Notes (Signed)
Subjective:    Patient ID: [name], female    DOB: [birth year], 50 y.o.   MRN: [id number] By signing my name below, I, Judithe Modest, attest that this documentation has been prepared under the direction and in the presence of Delman Cheadle, MD. Electronically Signed: Judithe Modest, ER Scribe. 04/08/2015. 9:21 AM.  Chief Complaint  Patient presents with  . Insect Bite    on chest     HPI HPI Comments: [name] is a 50 y.o. female who presents to [facility] complaining of fever (highest 101.2) and joint achiness in her joints for the past two days. She was also bitten by a tick two weeks ago, and the tick was attached for two days. She is concerned her sx may be related to her tick bite. She has no swollen glands that she is aware of, and her axilla are nontender. She denies sick contacts with people with the flu. She received a flu shot this year.She has not taken any abx recently. She has been waking up at night due to her fever.    Past Medical History  Diagnosis Date  . Insomnia   . Arrhythmia ventricular     s/p ablation 2001  . Menorrhagia   . Uterine fibroid     small  . Mitral valve disorder   . Ventricular tachycardia ([place])   . Chronic RLQ pain   . IBS (irritable bowel syndrome)   . Hiatal hernia   . GERD (gastroesophageal reflux disease)   . Plantar fasciitis   . Carpal tunnel syndrome, bilateral   . Dyslipidemia   . Anxiety   . History of migraine headaches   . Dysrhythmia     V- tach- resolved with ablation 2002  . MVP (mitral valve prolapse)   . Headache   . PONV (postoperative nausea and vomiting)    Allergies  Allergen Reactions  . Covera-Hs [Verapamil]     Blood blisters, Stevens-Johnson syndrome  . Crestor [Rosuvastatin Calcium]     Flu like symptoms, on different statin  . Wellbutrin [Bupropion] Palpitations   Current Outpatient Prescriptions on File Prior to Visit  Medication Sig Dispense Refill  . ALPRAZolam (XANAX) 1 MG  tablet TAKE 1 TABLET AT BEDTIME AS NEEDED FOR SLEEP. 30 tablet 0  . ALPRAZolam (XANAX) 1 MG tablet Take 1 tablet (1 mg total) by mouth at bedtime as needed for sleep. 30 tablet 0  . ALPRAZolam (XANAX) 1 MG tablet Take 1 tablet (1 mg total) by mouth at bedtime as needed for sleep. 30 tablet 0  . atorvastatin (LIPITOR) 20 MG tablet TAKE 1 TABLET BY MOUTH EVERY DAY 90 tablet 3  . flecainide (TAMBOCOR) 50 MG tablet TAKE 1 TABLET BY MOUTH 2 TIMES DAILY 60 tablet 11  . pantoprazole (PROTONIX) 40 MG tablet TAKE 1 TABLET (40 MG TOTAL) BY MOUTH 2 (TWO) TIMES DAILY. 180 tablet 3  . FLUoxetine (PROZAC) 20 MG tablet TAKE 1 TABLET DAILY.  "NO MORE REFILLS WITHOUT OV VISIT" (Patient not taking: Reported on 01/02/2015) 30 tablet 0  . ipratropium (ATROVENT) 0.03 % nasal spray SPRAY TWICE IN EACH NOSTRIL TWICE A DAY (Patient not taking: Reported on 04/08/2015) 30 mL 3   No current facility-administered medications on file prior to visit.   Review of Systems  Constitutional: Positive for fever, chills, diaphoresis, activity change, appetite change and fatigue.  HENT: Negative for congestion, rhinorrhea, sinus pressure and sore throat.   Respiratory: Negative for cough and shortness of  breath.   Gastrointestinal: Negative for nausea, vomiting and abdominal pain.  Musculoskeletal: Positive for myalgias and arthralgias. Negative for back pain, joint swelling and gait problem.  Skin: Positive for color change and wound. Negative for rash.  Neurological: Negative for syncope, light-headedness and headaches.  Hematological: Negative for adenopathy.  Psychiatric/Behavioral: Positive for sleep disturbance.      Objective:  BP 122/72 mmHg  Pulse 110  Temp(Src) 98.7 F (37.1 C) (Oral)  Resp 17  Ht 5' 5.5" (1.664 m)  Wt 173 lb (78.472 kg)  BMI 28.34 kg/m2  SpO2 94%  LMP 12/03/2014  Physical Exam  Constitutional: She is oriented to person, place, and time. She appears well-developed and well-nourished. No  distress.  HENT:  Head: Normocephalic and atraumatic.  Nares with purulent rhinitis. Oropharynx with redness.  Eyes: Pupils are equal, round, and reactive to light.  Neck: Neck supple.  Mild anterior cervical adenopathy. No posterior cervical adenopathy. No pre or post auricular adenopathy.  Cardiovascular: Normal rate.   Pulmonary/Chest: Effort normal. No respiratory distress.  Genitourinary:  No axillary adenopathy. No breast masses.   Musculoskeletal: Normal range of motion.  Lymphadenopathy:    She has no cervical adenopathy.  Neurological: She is alert and oriented to person, place, and time. Coordination normal.  Skin: Skin is warm and dry. She is not diaphoretic.  On right breast inner upper quadrant one by two inch purpuric lesion with pinpoint eschar, site is soft non tender without induration or fluctuance.   Psychiatric: She has a normal mood and affect. Her behavior is normal.  Nursing note and vitals reviewed.     Assessment & Plan:   1. Fever, unspecified   2. Tick bite   Reassuring that no rash but due to systemic vague infectious sxs beginning shortly after she found a tiny tick that was on her for at least 2d, do recommend coverage with doxy 100 bid x 2 wks. If still having any sxs inc myalgias/arthralgias still in 10d after starting doxy then rec continuing the doxy bid for a full 4 wks.  May want to consider repeating ab levels for RMSF and lyme in 1 mo if any sxs persist.  Orders Placed This Encounter  Procedures  . B. burgdorfi antibodies  . [place] spotted fvr abs pnl(IgG+IgM)  . C-reactive protein  . Comprehensive metabolic panel  . Lyme Ab/Western Blot Reflex  . POCT CBC  . POCT SEDIMENTATION RATE  . POCT Influenza A/B    Meds ordered this encounter  Medications  . doxycycline (VIBRAMYCIN) 100 MG capsule    Sig: Take 1 capsule (100 mg total) by mouth 2 (two) times daily.    Dispense:  28 capsule    Refill:  1    I personally performed the  services described in this documentation, which was scribed in my presence. The recorded information has been reviewed and considered, and addended by me as needed.  Delman Cheadle, MD MPH

## 2015-04-08 NOTE — Patient Instructions (Addendum)
IF you received an x-ray today, you will receive an invoice from [facility]. Please contact [facility] at [phone] with questions or concerns regarding your invoice.   IF you received labwork today, you will receive an invoice from [employer]. Please contact [facility] at [phone] with questions or concerns regarding your invoice.   Our billing staff will not be able to assist you with questions regarding bills from these companies.  You will be contacted with the lab results as soon as they are available. The fastest way to get your results is to activate your My Chart account. Instructions are located on the last page of this paperwork. If you have not heard from [place] regarding the results in 2 weeks, please contact this office.     Tick Bite Information Ticks are insects that attach themselves to the skin and draw blood for food. There are various types of ticks. Common types include wood ticks and deer ticks. Most ticks live in shrubs and grassy areas. Ticks can climb onto your body when you make contact with leaves or grass where the tick is waiting. The most common places on the body for ticks to attach themselves are the scalp, neck, armpits, waist, and groin. Most tick bites are harmless, but sometimes ticks carry germs that cause diseases. These germs can be spread to a person during the tick's feeding process. The chance of a disease spreading through a tick bite depends on:   The type of tick.  Time of year.   How long the tick is attached.   Geographic location.  HOW CAN YOU PREVENT TICK BITES? Take these steps to help prevent tick bites when you are outdoors:  Wear protective clothing. Long sleeves and long pants are best.   Wear white clothes so you can see ticks more easily.  Tuck your pant legs into your socks.   If walking on a trail, stay in the middle of the trail to avoid brushing against  bushes.  Avoid walking through areas with long grass.  Put insect repellent on all exposed skin and along boot tops, pant legs, and sleeve cuffs.   Check clothing, hair, and skin repeatedly and before going inside.   Brush off any ticks that are not attached.  Take a shower or bath as soon as possible after being outdoors.  WHAT IS THE PROPER WAY TO REMOVE A TICK? Ticks should be removed as soon as possible to help prevent diseases caused by tick bites. 1. If latex gloves are available, put them on before trying to remove a tick.  2. Using fine-point tweezers, grasp the tick as close to the skin as possible. You may also use curved forceps or a tick removal tool. Grasp the tick as close to its head as possible. Avoid grasping the tick on its body. 3. Pull gently with steady upward pressure until the tick lets go. Do not twist the tick or jerk it suddenly. This may break off the tick's head or mouth parts. 4. Do not squeeze or crush the tick's body. This could force disease-carrying fluids from the tick into your body.  5. After the tick is removed, wash the bite area and your hands with soap and water or other disinfectant such as alcohol. 6. Apply a small amount of antiseptic cream or ointment to the bite site.  7. Wash and disinfect any instruments that were used.  Do not try to remove a tick by applying a  hot match, petroleum jelly, or fingernail polish to the tick. These methods do not work and may increase the chances of disease being spread from the tick bite.  WHEN SHOULD YOU SEEK MEDICAL CARE? Contact your health care provider if you are unable to remove a tick from your skin or if a part of the tick breaks off and is stuck in the skin.  After a tick bite, you need to be aware of signs and symptoms that could be related to diseases spread by ticks. Contact your health care provider if you develop any of the following in the days or weeks after the tick bite:  Unexplained  fever.  Rash. A circular rash that appears days or weeks after the tick bite may indicate the possibility of Lyme disease. The rash may resemble a target with a bull's-eye and may occur at a different part of your body than the tick bite.  Redness and swelling in the area of the tick bite.   Tender, swollen lymph glands.   Diarrhea.   Weight loss.   Cough.   Fatigue.   Muscle, joint, or bone pain.   Abdominal pain.   Headache.   Lethargy or a change in your level of consciousness.  Difficulty walking or moving your legs.   Numbness in the legs.   Paralysis.  Shortness of breath.   Confusion.   Repeated vomiting.    This information is not intended to replace advice given to you by your health care provider. Make sure you discuss any questions you have with your health care provider.   Document Released: 12/21/1999 Document Revised: 01/13/2014 Document Reviewed: 06/02/2012 Elsevier Interactive Patient Education [employer].

## 2015-04-09 LAB — LYME AB/WESTERN BLOT REFLEX: B burgdorferi Ab IgG+IgM: <0.9 Index (ref <0.90)

## 2015-04-09 LAB — ROCKY MTN SPOTTED FVR ABS PNL(IGG+IGM)
RMSF IgG: 0.27 IV
RMSF IgM: 0.21 IV

## 2015-04-18 ENCOUNTER — Encounter: Payer: Self-pay | Admitting: *Deleted

## 2015-04-19 ENCOUNTER — Other Ambulatory Visit (INDEPENDENT_AMBULATORY_CARE_PROVIDER_SITE_OTHER): Payer: 59 | Admitting: Family Medicine

## 2015-04-19 DIAGNOSIS — W57XXXA Bitten or stung by nonvenomous insect and other nonvenomous arthropods, initial encounter: Secondary | ICD-10-CM

## 2015-04-19 DIAGNOSIS — R509 Fever, unspecified: Secondary | ICD-10-CM | POA: Diagnosis not present

## 2015-04-19 NOTE — Progress Notes (Unsigned)
Patient here for lab work only 

## 2015-04-20 LAB — SEDIMENTATION RATE: SED RATE: 11 mm/h (ref 0–30)

## 2015-04-20 LAB — C-REACTIVE PROTEIN

## 2015-04-20 LAB — LYME AB/WESTERN BLOT REFLEX: B burgdorferi Ab IgG+IgM: <0.9 Index (ref <0.90)

## 2015-04-23 LAB — ROCKY MTN SPOTTED FVR ABS PNL(IGG+IGM)
RMSF IgG: NOT DETECTED
RMSF IgM: NOT DETECTED

## 2015-05-02 ENCOUNTER — Other Ambulatory Visit: Payer: Self-pay | Admitting: Obstetrics and Gynecology

## 2015-05-02 DIAGNOSIS — N6489 Other specified disorders of breast: Secondary | ICD-10-CM

## 2015-05-09 ENCOUNTER — Ambulatory Visit
Admission: RE | Admit: 2015-05-09 | Discharge: 2015-05-09 | Disposition: A | Discharge status: Home / Self Care | Payer: 59 | Source: Ambulatory Visit | Attending: Obstetrics and Gynecology | Admitting: Obstetrics and Gynecology

## 2015-05-09 DIAGNOSIS — N6489 Other specified disorders of breast: Secondary | ICD-10-CM

## 2015-06-08 ENCOUNTER — Other Ambulatory Visit: Payer: Self-pay

## 2015-06-08 MED ORDER — PANTOPRAZOLE SODIUM 40 MG PO TBEC: DELAYED_RELEASE_TABLET | ORAL | Status: DC

## 2015-06-19 ENCOUNTER — Other Ambulatory Visit: Payer: Self-pay

## 2015-06-19 DIAGNOSIS — F418 Other specified anxiety disorders: Secondary | ICD-10-CM

## 2015-06-19 NOTE — Telephone Encounter (Signed)
Pt has appt sch for 6/27.

## 2015-06-19 NOTE — Telephone Encounter (Signed)
[facility] is requesting a 90 day RF of alprazolam 1 mg. Last local RF would have been due to be filled 06/03/15 for 30 days. Pended for 90 day, but could but may just want to change to 30 day RFs?

## 2015-06-22 MED ORDER — ALPRAZOLAM 1 MG PO TABS: 1.0000 mg | ORAL_TABLET | Freq: Every evening | ORAL | Status: DC | PRN

## 2015-06-22 MED ORDER — ALPRAZOLAM 1 MG PO TABS: ORAL_TABLET | ORAL | Status: DC

## 2015-06-22 NOTE — Telephone Encounter (Signed)
So, I cannot write a 90-day supply on one Rx.  I CAN write 3 30-day Rxs.  Meds ordered this encounter  Medications  . DISCONTD: ALPRAZolam (XANAX) 1 MG tablet    Sig: TAKE 1 TABLET AT BEDTIME AS NEEDED FOR SLEEP.    Dispense:  90 tablet    Refill:  0  . ALPRAZolam (XANAX) 1 MG tablet    Sig: Take 1 tablet (1 mg total) by mouth at bedtime as needed for sleep.    Dispense:  30 tablet    Refill:  0    May fill 30 days after date on prescription    Order Specific Question:  Supervising Provider    Answer:  DOOLITTLE, ROBERT P [id number]  . ALPRAZolam (XANAX) 1 MG tablet    Sig: Take 1 tablet (1 mg total) by mouth at bedtime as needed for sleep.    Dispense:  30 tablet    Refill:  0    May fill 60 days from date on prescription    Order Specific Question:  Supervising Provider    Answer:  Laney Pastor, ROBERT P [id number]  . ALPRAZolam (XANAX) 1 MG tablet    Sig: TAKE 1 TABLET AT BEDTIME AS NEEDED FOR SLEEP.    Dispense:  30 tablet    Refill:  0    Order Specific Question:  Supervising Provider    Answer:  DOOLITTLE, ROBERT P [id number]

## 2015-06-22 NOTE — Telephone Encounter (Signed)
Faxed

## 2015-07-03 ENCOUNTER — Other Ambulatory Visit: Payer: Self-pay | Admitting: Physician Assistant

## 2015-07-03 ENCOUNTER — Encounter: Payer: Self-pay | Admitting: Internal Medicine

## 2015-07-03 ENCOUNTER — Ambulatory Visit (INDEPENDENT_AMBULATORY_CARE_PROVIDER_SITE_OTHER): Payer: 59 | Admitting: Physician Assistant

## 2015-07-03 ENCOUNTER — Encounter: Payer: Self-pay | Admitting: Physician Assistant

## 2015-07-03 ENCOUNTER — Other Ambulatory Visit: Payer: Self-pay

## 2015-07-03 ENCOUNTER — Ambulatory Visit (INDEPENDENT_AMBULATORY_CARE_PROVIDER_SITE_OTHER): Payer: 59

## 2015-07-03 ENCOUNTER — Ambulatory Visit: Payer: 59

## 2015-07-03 ENCOUNTER — Telehealth: Payer: Self-pay

## 2015-07-03 VITALS — BP 118/70 | HR 83 | Temp 98.3°F | Resp 16 | Ht 66.0 in | Wt 156.0 lb

## 2015-07-03 DIAGNOSIS — Z1211 Encounter for screening for malignant neoplasm of colon: Secondary | ICD-10-CM | POA: Diagnosis not present

## 2015-07-03 DIAGNOSIS — E785 Hyperlipidemia, unspecified: Secondary | ICD-10-CM

## 2015-07-03 DIAGNOSIS — R202 Paresthesia of skin: Secondary | ICD-10-CM | POA: Diagnosis not present

## 2015-07-03 DIAGNOSIS — R519 Headache, unspecified: Secondary | ICD-10-CM

## 2015-07-03 DIAGNOSIS — R51 Headache: Secondary | ICD-10-CM

## 2015-07-03 DIAGNOSIS — M5030 Other cervical disc degeneration, unspecified cervical region: Secondary | ICD-10-CM

## 2015-07-03 DIAGNOSIS — Z6825 Body mass index (BMI) 25.0-25.9, adult: Secondary | ICD-10-CM

## 2015-07-03 DIAGNOSIS — F418 Other specified anxiety disorders: Secondary | ICD-10-CM

## 2015-07-03 DIAGNOSIS — M79601 Pain in right arm: Secondary | ICD-10-CM

## 2015-07-03 DIAGNOSIS — M79602 Pain in left arm: Secondary | ICD-10-CM

## 2015-07-03 LAB — CBC WITH DIFFERENTIAL/PLATELET
BASOS PCT: 1 %
Basophils Absolute: 53 cells/uL (ref 0–200)
EOS PCT: 3 %
Eosinophils Absolute: 159 cells/uL (ref 15–500)
HCT: 40.9 % (ref 35.0–45.0)
HEMOGLOBIN: 13.7 g/dL (ref 11.7–15.5)
LYMPHS ABS: 1537 {cells}/uL (ref 850–3900)
Lymphocytes Relative: 29 %
MCH: 30.4 pg (ref 27.0–33.0)
MCHC: 33.5 g/dL (ref 32.0–36.0)
MCV: 90.9 fL (ref 80.0–100.0)
MPV: 9.8 fL (ref 7.5–12.5)
Monocytes Absolute: 424 cells/uL (ref 200–950)
Monocytes Relative: 8 %
NEUTROS ABS: 3127 {cells}/uL (ref 1500–7800)
NEUTROS PCT: 59 %
Platelets: 235 10*3/uL (ref 140–400)
RBC: 4.5 MIL/uL (ref 3.80–5.10)
RDW: 13.4 % (ref 11.0–15.0)
WBC: 5.3 10*3/uL (ref 3.8–10.8)

## 2015-07-03 LAB — COMPREHENSIVE METABOLIC PANEL
ALBUMIN: 4.9 g/dL (ref 3.6–5.1)
ALK PHOS: 48 U/L (ref 33–130)
ALT: 12 U/L (ref 6–29)
AST: 15 U/L (ref 10–35)
BUN: 20 mg/dL (ref 7–25)
CALCIUM: 9.7 mg/dL (ref 8.6–10.4)
CHLORIDE: 102 mmol/L (ref 98–110)
CO2: 25 mmol/L (ref 20–31)
Creat: 0.71 mg/dL (ref 0.50–1.05)
Glucose, Bld: 104 mg/dL — ABNORMAL HIGH (ref 65–99)
POTASSIUM: 4.6 mmol/L (ref 3.5–5.3)
Sodium: 142 mmol/L (ref 135–146)
TOTAL PROTEIN: 7.2 g/dL (ref 6.1–8.1)
Total Bilirubin: 0.7 mg/dL (ref 0.2–1.2)

## 2015-07-03 LAB — LIPID PANEL
CHOLESTEROL: 204 mg/dL — AB (ref 125–200)
HDL: 52 mg/dL (ref >=46)
LDL Cholesterol: 124 mg/dL (ref <130)
TRIGLYCERIDES: 141 mg/dL (ref <150)
Total CHOL/HDL Ratio: 3.9 Ratio (ref <=5.0)
VLDL: 28 mg/dL (ref <30)

## 2015-07-03 NOTE — Progress Notes (Signed)
Patient ID: [name], female    DOB: 1965/04/10, 50 y.o.   MRN: [id number]  PCP: Harrison Mons, PA-C  Subjective:   Chief Complaint  Patient presents with  . cholestrol check    after starting lipitor    HPI Presents for evaluation of hyperlipidemia.  Feels generally well, tolerating medications without adverse effects.  Is interested in referral to [facility]. 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  She is having recurrent symptoms, though they are not as bad as before (specifically, the pain is not as severe) Numbness of the both arms (ulnar distribution), L>R. Headaches. Different from her neck pain, located in the back of the head. It extends over the entire head and can last all day, sometimes several times each week. Some improvement with OTC acetaminophen. Dizziness, not "quite like" vertigo. Felt off balance with rapid position change of the head. With extreme lateral gaze, feels like vision is "off."  Working on healthier eating choices, specifically, reduced portions. Has lost 30 lbs.   Review of Systems As above. No CP, SOB, GI/GU symptoms.    Patient Active Problem List   Diagnosis Date Noted  . Fibroids, submucosal 12/07/2013  . Depression with anxiety 10/19/2013  . Cough 10/19/2013  . Mild obesity 09/07/2013  . Hyperlipidemia 10/04/2008  . VENTRICULAR TACHYCARDIA 10/04/2008  . RLQ PAIN 06/07/2007  . CARPAL TUNNEL SYNDROME, BILATERAL 05/06/2007  . MITRAL VALVE DISORDERS 05/06/2007  . ABNORMAL HEART RHYTHMS 05/06/2007  . GERD 05/06/2007  . HIATAL HERNIA 05/06/2007  . IBS 05/06/2007  . PLANTAR FASCIITIS 05/06/2007     Prior to Admission medications   Medication Sig Start Date End Date Taking? Authorizing Provider  ALPRAZolam [name]) 1 MG tablet Take 1 tablet (1 mg total) by mouth at bedtime as needed for sleep. 06/22/15  Yes Masaru Chamberlin, PA-C  ALPRAZolam (XANAX) 1 MG  tablet Take 1 tablet (1 mg total) by mouth at bedtime as needed for sleep. 06/22/15  Yes Lateesha Bezold, PA-C  ALPRAZolam (XANAX) 1 MG tablet TAKE 1 TABLET AT BEDTIME AS NEEDED FOR SLEEP. 06/22/15  Yes Jaretzi Droz, PA-C  atorvastatin (LIPITOR) 20 MG tablet TAKE 1 TABLET BY MOUTH EVERY DAY 12/25/14  Yes Dannetta Lekas, PA-C  flecainide (TAMBOCOR) 50 MG tablet TAKE 1 TABLET BY MOUTH 2 TIMES DAILY 09/12/14  Yes Evans Lance, MD  pantoprazole (PROTONIX) 40 MG tablet TAKE 1 TABLET (40 MG TOTAL) BY MOUTH 2 (TWO) TIMES DAILY. 06/08/15  Yes Harrison Mons, PA-C     Allergies  Allergen Reactions  . Covera-Hs [Verapamil]     Blood blisters, Stevens-Johnson syndrome  . Crestor [Rosuvastatin Calcium]     Flu like symptoms, on different statin  . Wellbutrin [Bupropion] Palpitations       Objective:  Physical Exam  Constitutional: She is oriented to person, place, and time. She appears well-developed and well-nourished. She is active and cooperative. No distress.  BP 118/70 mmHg  Pulse 83  Temp(Src) 98.3 F (36.8 C) (Oral)  Resp 16  Ht 5\' 6"  (1.676 m)  Wt 156 lb (70.761 kg)  BMI 25.19 kg/m2  LMP 12/03/2014  HENT:  Head: Normocephalic and atraumatic.  Right Ear: Hearing normal.  Left Ear: Hearing normal.  Eyes: Conjunctivae are normal. No scleral icterus.  Neck: Normal range of motion. Neck supple. No thyromegaly present.  Cardiovascular: Normal rate, regular rhythm and normal heart sounds.   Pulses:      Radial  pulses are 2+ on the right side, and 2+ on the left side.  Pulmonary/Chest: Effort normal and breath sounds normal.  Musculoskeletal:       Cervical back: She exhibits tenderness and pain. She exhibits normal range of motion, no bony tenderness, no swelling, no edema, no deformity, no laceration, no spasm and normal pulse.  Lymphadenopathy:       Head (right side): No tonsillar, no preauricular, no posterior auricular and no occipital adenopathy present.       Head (left side): No  tonsillar, no preauricular, no posterior auricular and no occipital adenopathy present.    She has no cervical adenopathy.       Right: No supraclavicular adenopathy present.       Left: No supraclavicular adenopathy present.  Neurological: She is alert and oriented to person, place, and time. She has normal strength. No cranial nerve deficit or sensory deficit.  Reflex Scores:      Bicep reflexes are 2+ on the right side and 2+ on the left side.      Patellar reflexes are 2+ on the right side and 2+ on the left side. Skin: Skin is warm, dry and intact. No rash noted. No cyanosis or erythema. Nails show no clubbing.  Psychiatric: She has a normal mood and affect. Her speech is normal and behavior is normal.       Dg Cervical Spine Complete  07/03/2015  CLINICAL DATA:  Upper extremity paresthesias EXAM: CERVICAL SPINE - COMPLETE 4+ VIEW COMPARISON:  None in PACs FINDINGS: The cervical vertebral bodies are preserved in height. 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 There is no perched facet or significant facet joint hypertrophy. The spinous processes are intact. There is disc space narrowing at C4-5 and C5-6. There is no spondylolisthesis. There is no significant bony encroachment upon the neural foramina. The odontoid is intact. IMPRESSION: 1. There is mild degenerative disc disease centered at C4-5 and C5-6. No significant bony encroachment upon the neural foramina is observed. 2. 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 3. If the patient's radicular symptoms persist, MRI of the cervical spine would be a useful next imaging step. Electronically Signed   By: David  Martinique M.D.   On: 07/03/2015 09:34       Assessment & Plan:   1. Hyperlipidemia Await labs. Adjust statin dose if indicated. - Lipid panel - Comprehensive metabolic panel  2. BMI 25.0-25.9,adult Congratulated. Encouraged continued healthy eating choices and exercise.  3. Nonintractable headache,  unspecified chronicity pattern, unspecified headache type 4. Paresthesia 6. DDD (degenerative disc disease), cervical MR c-spine to further evaluate. Depending on results, plan referral to [place] vs. Neurosurgery, or both. - CBC with Differential/Platelet - DG Cervical Spine Complete; Future  5. Screening for colon cancer - Ambulatory referral to Gastroenterology    Fara Chute, PA-C Physician Assistant-Certified [facility]

## 2015-07-03 NOTE — Patient Instructions (Signed)
     IF you received an x-ray today, you will receive an invoice from [facility]. Please contact [facility] at [phone] with questions or concerns regarding your invoice.   IF you received labwork today, you will receive an invoice from [facility]. Please contact [facility] at [phone] with questions or concerns regarding your invoice.   Our billing staff will not be able to assist you with questions regarding bills from these companies.  You will be contacted with the lab results as soon as they are available. The fastest way to get your results is to activate your My Chart account. Instructions are located on the last page of this paperwork. If you have not heard from us regarding the results in 2 weeks, please contact this office.      

## 2015-07-03 NOTE — Progress Notes (Signed)
Subjective:    Patient ID: [name], female    DOB: 1965/10/02, 50 y.o.   MRN: [id number]  Chief Complaint  Patient presents with  . cholestrol check    after starting lipitor    HPI  Patient presents today for follow up of hyperlipidemia.  Compliant with medication.  Denies muscle aches, rash, nausea, vomiting, diarrhea, URI symptoms.  Patient has been experiencing numbness in her 4th and 5th digit, bilaterally, worse on the left than right, over last several months.  Worsened by typing. Relieved by rest. She is requesting a referral to [facility], who she has seen previously.  Admits to constant neck pain, worried she may have ruptured another disc.  Per patient hx of ruptured disc repair, then cerebral buldging disk and bone spur DX in 2002.  Complains of headache which begins at base the of her skull to frontal lobe, occuring several days a week lasting 1/2 to 1 whole day at a time. She has been taking 3 tylenol, which takes the edge off, but does not relieve completely.   Also experiencing dizziness, not like vertigo, that occurs when she looks up and back down, and makes her feel unsteady.  Experiencing vision trouble where she cant focus clearly, when looking at perpheray feels like her equilibruim is off. Vision trouble occurred prior to dizziness and numbness.  Patient has been dieting, currently down 30lbs.  Taking medication as prescribed  Review of Systems All others negative except those listed in HPI.     Patient Active Problem List   Diagnosis Date Noted  . BMI 25.0-25.9,adult 07/03/2015  . Fibroids, submucosal 12/07/2013  . Depression with anxiety 10/19/2013  . Cough 10/19/2013  . Mild obesity 09/07/2013  . Hyperlipidemia 10/04/2008  . VENTRICULAR TACHYCARDIA 10/04/2008  . RLQ PAIN 06/07/2007  . CARPAL TUNNEL SYNDROME, BILATERAL 05/06/2007  . MITRAL VALVE DISORDERS 05/06/2007  . ABNORMAL HEART RHYTHMS 05/06/2007  . GERD 05/06/2007  .  HIATAL HERNIA 05/06/2007  . IBS 05/06/2007  . PLANTAR FASCIITIS 05/06/2007    . Objective: BP 118/70 mmHg  Pulse 83  Temp(Src) 98.3 F (36.8 C) (Oral)  Resp 16  Ht 5\' 6"  (1.676 m)  Wt 156 lb (70.761 kg)  BMI 25.19 kg/m2  LMP 12/03/2014   Physical Exam  Constitutional: She is oriented to person, place, and time. She appears well-developed and well-nourished. No distress.  HENT:  Head: Normocephalic.  Eyes: Conjunctivae are normal. Pupils are equal, round, and reactive to light.  Neck: Normal range of motion. Neck supple. Thyromegaly present.  Cardiovascular: Normal rate, regular rhythm, normal heart sounds and intact distal pulses.   Pulses:      Radial pulses are 2+ on the right side, and 2+ on the left side.       Posterior tibial pulses are 2+ on the right side, and 2+ on the left side.  Pulmonary/Chest: Effort normal and breath sounds normal.  Musculoskeletal: Normal range of motion.  Neurological: She is alert and oriented to person, place, and time. She has normal strength and normal reflexes.  Reflex Scores:      Bicep reflexes are 2+ on the right side and 2+ on the left side.      Brachioradialis reflexes are 2+ on the right side and 2+ on the left side.      Patellar reflexes are 2+ on the right side and 2+ on the left side. 5/5 muscle strength, equal and symmetric bilaterally. Sensation to pain and light touch  decreased along   Skin: She is not diaphoretic.   Dg Cervical Spine Complete  07/03/2015  CLINICAL DATA:  Upper extremity paresthesias EXAM: CERVICAL SPINE - COMPLETE 4+ VIEW COMPARISON:  None in PACs FINDINGS: The cervical vertebral bodies are preserved in height. 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 There is no perched facet or significant facet joint hypertrophy. The spinous processes are intact. There is disc space narrowing at C4-5 and C5-6. There is no spondylolisthesis. There is no significant bony encroachment upon the neural foramina.  The odontoid is intact. IMPRESSION: 1. There is mild degenerative disc disease centered at C4-5 and C5-6. No significant bony encroachment upon the neural foramina is observed. 2. 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 3. If the patient's radicular symptoms persist, MRI of the cervical spine would be a useful next imaging step. Electronically Signed   By: David  Martinique M.D.   On: 07/03/2015 09:34       Assessment & Plan:  1. Hyperlipidemia Labs drawn, results pending.  Will adjust medications if indicated. - Lipid panel - Comprehensive metabolic panel  2. BMI 25.0-25.9,adult Patient encouraged to continue making healthy choices, exercising and ongoing weight loss.  3. Nonintractable headache, unspecified chronicity pattern, unspecified headache type Discussed management with patient and we decided to wait until after her MRI to determine if she should be referred to neurology or a neurosurgeon.  We will re-evaluate her headaches at a later date. - CBC with Differential/Platelet  4. Paresthesia 6. DDD (degenerative disc disease), cervical Discussed cervical spine x-ray with patient.  Ordered MRI of cervical spine as next step in determining if there is compression of nerves causing her radicular symptoms.  Will determine next step in management following results of her MRI. - DG Cervical Spine Complete; Future - MR Cervical Spine W Contrast; Future  5. Screening for colon cancer Referral to GI for colonoscopy.  - Ambulatory referral to Gastroenterology  Will contact patient with lab and MRI results.  Further management and referrals will be made pending results. Schedule a follow up in 6 months for management of hyperlipidemia.   Deandrea Vanpelt P. Jahnessa Vanduyn, PA-S

## 2015-07-03 NOTE — Telephone Encounter (Signed)
The patient's MRI requires a peer-to-peer discussion before an official determination can be made.  The Case Number is [id number].  The ordering physician listed is Dr Tamala Julian [facility] does not allow [place] to select PAs as the ordering provider).  The ordering physician must call [phone] and select option #3 to engage in a Physician-to-Physician discussion. Please ensure the physician has the case number provided above available when making this call. If a Physician-to-Physician discussion is not conducted within 3 business days, this notification number request will be deemed expired and you must reinitiate the Notification Process.

## 2015-07-04 MED ORDER — ALPRAZOLAM 1 MG PO TABS: ORAL_TABLET | ORAL | Status: DC

## 2015-07-04 NOTE — Addendum Note (Signed)
Addended by: Fara Chute on: 07/04/2015 02:49 PM   Modules accepted: Orders

## 2015-07-04 NOTE — Telephone Encounter (Signed)
[phone]

## 2015-07-04 NOTE — Telephone Encounter (Signed)
Please call/fax to the patient's local pharmacy. [facility] prescription to her old address and it was returned to them as undeliverable. She needs a supply to last her until the mail order supply arrives.  Meds ordered this encounter  Medications  . ALPRAZolam (XANAX) 1 MG tablet    Sig: TAKE 1 TABLET AT BEDTIME AS NEEDED FOR SLEEP.    Dispense:  30 tablet    Refill:  0    Order Specific Question:  Supervising Provider    Answer:  DOOLITTLE, ROBERT P [id number]

## 2015-07-05 ENCOUNTER — Other Ambulatory Visit: Payer: Self-pay | Admitting: Family Medicine

## 2015-07-05 NOTE — Telephone Encounter (Signed)
faxed to [address] [phone]

## 2015-07-07 ENCOUNTER — Telehealth: Payer: Self-pay

## 2015-07-07 ENCOUNTER — Telehealth: Payer: Self-pay | Admitting: Emergency Medicine

## 2015-07-07 NOTE — Telephone Encounter (Signed)
Spoke with pharmacy ok to fill early.  [name] sent prescription in.  Patient is aware she may have to pay.

## 2015-07-07 NOTE — Telephone Encounter (Signed)
Left message for pt to return call regarding RF Xanax

## 2015-07-12 ENCOUNTER — Ambulatory Visit
Admission: RE | Admit: 2015-07-12 | Discharge: 2015-07-12 | Disposition: A | Discharge status: Home / Self Care | Payer: 59 | Source: Ambulatory Visit | Attending: Physician Assistant | Admitting: Physician Assistant

## 2015-07-12 DIAGNOSIS — R202 Paresthesia of skin: Secondary | ICD-10-CM

## 2015-07-12 DIAGNOSIS — M79602 Pain in left arm: Secondary | ICD-10-CM

## 2015-07-12 DIAGNOSIS — M5030 Other cervical disc degeneration, unspecified cervical region: Secondary | ICD-10-CM

## 2015-07-12 DIAGNOSIS — M79601 Pain in right arm: Secondary | ICD-10-CM

## 2015-07-12 DIAGNOSIS — R51 Headache: Principal | ICD-10-CM

## 2015-07-12 DIAGNOSIS — R519 Headache, unspecified: Secondary | ICD-10-CM

## 2015-07-13 ENCOUNTER — Other Ambulatory Visit: Payer: Self-pay | Admitting: Physician Assistant

## 2015-07-13 DIAGNOSIS — R519 Headache, unspecified: Secondary | ICD-10-CM

## 2015-07-13 DIAGNOSIS — R51 Headache: Principal | ICD-10-CM

## 2015-07-13 DIAGNOSIS — R202 Paresthesia of skin: Secondary | ICD-10-CM

## 2015-07-19 ENCOUNTER — Encounter: Payer: Self-pay | Admitting: Internal Medicine

## 2015-08-14 ENCOUNTER — Encounter: Payer: Self-pay | Admitting: Neurology

## 2015-08-14 ENCOUNTER — Ambulatory Visit (INDEPENDENT_AMBULATORY_CARE_PROVIDER_SITE_OTHER): Payer: 59 | Admitting: Neurology

## 2015-08-14 VITALS — BP 132/70 | HR 80 | Resp 20 | Ht 64.0 in | Wt 149.0 lb

## 2015-08-14 DIAGNOSIS — R208 Other disturbances of skin sensation: Secondary | ICD-10-CM | POA: Diagnosis not present

## 2015-08-14 NOTE — Progress Notes (Signed)
CLINIC Note    Provider:  Larey Seat, M D  Referring Provider: Harrison Mons, PA-C Primary Care Physician:  Harrison Mons, PA-C  Chief Complaint  Patient presents with  . New Patient (Initial Visit)    has bulging discs, weakness/tingling hands, blurred vision, headaches    HPI:  [name] is a 50 y.o. female , seen here as a referral  from [place] for Evaluation of new onset dizziness, numbness in both arms, headaches delayed word finding, and a history of cervical fusion at C6-7.   I had the pleasure of seeing [name]  in 2010 or 2011 when she presented with an acute first onset of migraines. She soon recovered with medication. It is here today for a new problem in the meantime she has been diagnosed with degenerative disc disease , soon after she  underwent a C5-C6 fusion. The symptoms that I elicited above may have been partially related to problems at the cervical cord level but there was no significant bony encroachment upon the neural foramina nor exit stenosis nor other fusion and only a tiny right protrusion with  annular fissure between C5 and C6.  No impingement of the nerves were noted. She had first  to undergo a cervical spine x-ray which was then followed by an MRI of the cervical spine, ordered by [name]. The cervical spine MRI was performed on 07/12/2015  At [facility]. There was some overall mild is degeneration noted. This is not enough to explain headaches and certainly not tingling sensation in both upper extremities. Both hands tingle on the dorsal [name] is antebrachial in the same region. The spinal anatomy does not explain this. She also reports foot weakness, feeling heavy.  She often feels as if her hands and feet are asleep and sometimes she can shake this sensation off, other days it may stay for hours. She also reports sleep difficulties which have been long-standing. For the last 6 or 7 years she has been taking 1 mg  of alprazolam every night to initiate sleep.  Medical history and family sleep history:  Sister has migraines, daughter has migraines, no MS in the family, no PD, grandfather had a stroke at 36.     Social history: married with children , no tobacco products , ETOH 2 a week only.,caffeine : cut out, only 1 glass of tea a week.   Review of Systems: Out of a complete 14 system review, the patient complains of only the following symptoms, and all other reviewed systems are negative. She further endorsed blurred vision, joint pain, aching muscles, dizziness, weakness, numbness, headaches, insomnia. Has a history of arrhythmia controlled with medication, history of cardiac ablation elevated cholesterol, ruptured disc repair 2000 for wrist fusion, high cholesterol. Surgical history positive for spinal fusion 2003, hysterectomy 2015, cardiac ablation in 2003, knee surgery in [place] History  . Marital status: Married    Spouse name: [name]  . Number of children: 2  . Years of education: 16   Occupational History  . [employer]    Social History Main Topics  . Smoking status: Never Smoker  . Smokeless tobacco: Never Used  . Alcohol use 3.0 oz/week    5 Glasses of wine per week  . Drug use: No  . Sexual activity: Yes    Partners: Male    Birth control/ protection: None     Comment: h/o  infertility   Other Topics Concern  . Not on file   Social History Narrative   Patient is married [name]) lives with her husband (2nd marriage, [employer]). Two daughters live with them.  Oldest daughter is grown and lives locally with her husband and children.   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   Patient is right-handed.   Patient drinks one cup of caffeine daily.    Family History  Problem Relation Age of Onset  . Hiatal hernia Mother   . Irritable bowel  syndrome Mother   . Hypertension Mother   . Thyroid disease Mother   . Heart disease Mother 27    AMI, 90% LAD, stent placement  . Cancer Father     lung (+ TOBACCO)  . Hypertension Sister   . Seizures Sister   . Headache Sister   . Colon cancer Neg Hx     Past Medical History:  Diagnosis Date  . Anxiety   . Arrhythmia ventricular    s/p ablation 2001  . Carpal tunnel syndrome, bilateral   . Chronic RLQ pain   . Dyslipidemia   . Dysrhythmia    V- tach- resolved with ablation 2002  . GERD (gastroesophageal reflux disease)   . Headache   . Hiatal hernia   . History of migraine headaches   . IBS (irritable bowel syndrome)   . Insomnia   . Menorrhagia   . Mitral valve disorder   . MVP (mitral valve prolapse)   . Plantar fasciitis   . PONV (postoperative nausea and vomiting)   . Uterine fibroid    small  . Ventricular tachycardia [facility])     Past Surgical History:  Procedure Laterality Date  . ABDOMINAL HYSTERECTOMY    . BILATERAL SALPINGECTOMY Bilateral 12/07/2013   Procedure: BILATERAL SALPINGECTOMY;  Surgeon: Lovenia Kim, MD;  Location: [facility];  Service: Gynecology;  Laterality: Bilateral;  . CARDIAC ELECTROPHYSIOLOGY [place] AND ABLATION  2002  . cspine  05/1999   c5-6  . echocardiogram  2000/2001  . KNEE SURGERY  1998  . PATELLA ARTHROPLASTY    . ROBOTIC ASSISTED TOTAL HYSTERECTOMY N/A 12/07/2013   Procedure: ROBOTIC ASSISTED TOTAL HYSTERECTOMY;  Surgeon: Lovenia Kim, MD;  Location: [facility];  Service: Gynecology;  Laterality: N/A;  . SPINE SURGERY      Current Outpatient Prescriptions  Medication Sig Dispense Refill  . ALPRAZolam (XANAX) 1 MG tablet TAKE 1 TABLET AT BEDTIME AS NEEDED FOR SLEEP. 30 tablet 0  . atorvastatin (LIPITOR) 20 MG tablet TAKE 1 TABLET BY MOUTH EVERY DAY 90 tablet 3  . cyclobenzaprine (FLEXERIL) 10 MG tablet Take 10 mg by mouth at bedtime as needed.  3  . diclofenac (VOLTAREN) 75 MG EC tablet Take 75 mg by mouth daily as needed.     . flecainide (TAMBOCOR) 50 MG tablet TAKE 1 TABLET BY MOUTH 2 TIMES DAILY 60 tablet 11  . pantoprazole (PROTONIX) 40 MG tablet TAKE 1 TABLET (40 MG TOTAL) BY MOUTH 2 (TWO) TIMES DAILY. 180 tablet 1   No current facility-administered medications for this visit.     Allergies as of 08/14/2015 - Review Complete 08/14/2015  Allergen Reaction Noted  . Covera-hs [verapamil]  05/06/2007  . Crestor [rosuvastatin calcium]  10/15/2010  . Wellbutrin [bupropion] Palpitations 01/02/2015    Vitals: BP 132/70   Pulse 80   Resp 20   Ht 5'  4" (1.626 m)   Wt 149 lb (67.6 kg)   LMP 12/03/2014   BMI 25.58 kg/m  Last Weight:  Wt Readings from Last 1 Encounters:  08/14/15 149 lb (67.6 kg)   [id number] mass index is 25.58 kg/m.     Last Height:   Ht Readings from Last 1 Encounters:  08/14/15 5\' 4"  (1.626 m)    Physical exam:  General: The patient is awake, alert and appears not in acute distress. The patient is well groomed. Head: Normocephalic, atraumatic. Neck is supple. Mallampati 3   neck circumference:15 . Nasal airflow patent ,  Cardiovascular:  irregular rate and rhythm , on Flecainide Dr Marya Amsler taylor. without  murmurs or carotid bruit, and without distended neck veins. Respiratory: Lungs are clear to auscultation. Skin:  Without evidence of edema, or rash Trunk: BMI is normal  The patient's posture is erect    Neurologic exam : The patient is awake and alert, oriented to place and time.   Memory subjective  described as intact. Attention span & concentration ability appears normal.  Speech is fluent,  without dysarthria, dysphonia or aphasia.  Mood and affect are appropriate.  Cranial nerves: Pupils are equal and briskly reactive to light. Funduscopic exam without evidence of pallor or edema.  Extraocular movements  in vertical and horizontal planes intact and without nystagmus. Visual fields by finger perimetry are intact. Hearing to finger rub intact. Facial sensation intact to  fine touch. Facial motor strength is symmetric and tongue and uvula move midline. Shoulder shrug was symmetrical.   Motor exam: Normal tone, muscle bulk and symmetric strength in all extremities. Sensory:  Fine touch, pinprick and vibration were tested in all extremities. Proprioception tested in the upper extremities was normal. Coordination: Rapid alternating movements in the fingers/hands was normal. Finger-to-nose maneuver  normal without evidence of ataxia, dysmetria or tremor. Gait and station: Patient walks without assistive device and is able unassisted to climb up to the exam table. Strength within normal limits.  Stance is stable and normal.  Deep tendon reflexes: in the  upper and lower extremities are symmetric and intact. Babinski maneuver response is downgoing.  The patient was advised of the nature of the diagnosed sleep disorder , the treatment options and risks for general a health and wellness arising from not treating the condition.  I spent more than  45  minutes of face to face time with the patient. Greater than 50% of time was spent in counseling and coordination of care. We have discussed the diagnosis and differential and I answered the patient's questions.     Assessment:  After physical and neurologic examination, review of laboratory studies,  Personal review of imaging studies, reports of other /same  Imaging studies ,  Results of polysomnography/ neurophysiology testing and pre-existing records as far as provided in visit., my assessment is   1) statis post ablation for cardiac arrhythmia. Palpitations are reduced.  2) hand numbness not in dermatomal distriibution.  3) I will order a nerve conduction study EMG for the upper extremities, and for the left lower extremity. I suspect that the anterior fusion may have led to scar tissue affecting whole the patient feels now, since her palpation of her spine and paraspinal area and the joints under motion not show any  impingement, tenderness or spasm.    Plan:  Treatment plan and additional workup : NCV and EMG, follow with RV.  Lost 40 ponds since last seen, no apnea and no snoring. Insomnia is  treated with alprazolam, PA  [name] Jeffery.  REFLUX, on pentoprazole- continue.    Asencion Partridge Tomiko Schoon MD  08/14/2015   CC: Harrison Mons, [place] [address], [place] [address]

## 2015-08-16 ENCOUNTER — Other Ambulatory Visit: Payer: Self-pay | Admitting: Internal Medicine

## 2015-08-22 ENCOUNTER — Encounter: Payer: Self-pay | Admitting: Internal Medicine

## 2015-08-22 ENCOUNTER — Other Ambulatory Visit: Payer: Self-pay

## 2015-08-22 ENCOUNTER — Ambulatory Visit (INDEPENDENT_AMBULATORY_CARE_PROVIDER_SITE_OTHER): Payer: 59 | Admitting: Internal Medicine

## 2015-08-22 VITALS — BP 114/82 | HR 85 | Ht 64.0 in | Wt 153.8 lb

## 2015-08-22 DIAGNOSIS — F418 Other specified anxiety disorders: Secondary | ICD-10-CM

## 2015-08-22 DIAGNOSIS — I472 Ventricular tachycardia, unspecified: Secondary | ICD-10-CM

## 2015-08-22 MED ORDER — ALPRAZOLAM 1 MG PO TABS: ORAL_TABLET | ORAL | 0 refills | Status: DC

## 2015-08-22 MED ORDER — FLECAINIDE ACETATE 50 MG PO TABS: 50.0000 mg | ORAL_TABLET | Freq: Two times a day (BID) | ORAL | 3 refills | Status: DC

## 2015-08-22 NOTE — Addendum Note (Signed)
Addended by: Lamar Laundry on: 08/22/2015 09:04 AM   Modules accepted: Orders

## 2015-08-22 NOTE — Addendum Note (Signed)
Addended by: Zebedee Iba on: 08/22/2015 09:16 AM   Modules accepted: Orders

## 2015-08-22 NOTE — Progress Notes (Signed)
HPI [name] returns today for followup. 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 She underwent catheter ablation over 12 years ago. Post procedure, she had residual palpitations due to PVCs and was placed on flecainide. She has done well since then with minimal tachy palpitations. She denies chest pain, shortness of breath, or syncope. She is doing well except for a ruptured disc in her neck.  Allergies  Allergen Reactions  . Covera-Hs [Verapamil]     Blood blisters, Stevens-Johnson syndrome  . Crestor [Rosuvastatin Calcium]     Flu like symptoms, on different statin  . Wellbutrin [Bupropion] Palpitations     Current Outpatient Prescriptions  Medication Sig Dispense Refill  . ALPRAZolam (XANAX) 1 MG tablet TAKE 1 TABLET AT BEDTIME AS NEEDED FOR SLEEP. 30 tablet 0  . atorvastatin (LIPITOR) 20 MG tablet TAKE 1 TABLET BY MOUTH EVERY DAY 90 tablet 3  . cyclobenzaprine (FLEXERIL) 10 MG tablet Take 10 mg by mouth at bedtime as needed.  3  . diclofenac (VOLTAREN) 75 MG EC tablet Take 75 mg by mouth daily as needed.    . flecainide (TAMBOCOR) 50 MG tablet TAKE 1 TABLET BY MOUTH 2 TIMES DAILY 60 tablet 0  . pantoprazole (PROTONIX) 40 MG tablet TAKE 1 TABLET (40 MG TOTAL) BY MOUTH 2 (TWO) TIMES DAILY. 180 tablet 1   No current facility-administered medications for this visit.      Past Medical History:  Diagnosis Date  . Anxiety   . Arrhythmia ventricular    s/p ablation 2001  . Carpal tunnel syndrome, bilateral   . Chronic RLQ pain   . Dyslipidemia   . Dysrhythmia    V- tach- resolved with ablation 2002  . GERD (gastroesophageal reflux disease)   . Headache   . Hiatal hernia   . History of migraine headaches   . IBS (irritable bowel syndrome)   . Insomnia   . Menorrhagia   . Mitral valve disorder   . MVP (mitral valve prolapse)   . Plantar fasciitis   . PONV (postoperative  nausea and vomiting)   . Uterine fibroid    small  . Ventricular tachycardia ([place])     ROS:   All systems reviewed and negative except as noted in the HPI.   Past Surgical History:  Procedure Laterality Date  . ABDOMINAL HYSTERECTOMY    . BILATERAL SALPINGECTOMY Bilateral 12/07/2013   Procedure: BILATERAL SALPINGECTOMY;  Surgeon: Lovenia Kim, MD;  Location: [facility];  Service: Gynecology;  Laterality: Bilateral;  . CARDIAC ELECTROPHYSIOLOGY [place] AND ABLATION  2002  . cspine  05/1999   c5-6  . echocardiogram  2000/2001  . KNEE SURGERY  1998  . PATELLA ARTHROPLASTY    . ROBOTIC ASSISTED TOTAL HYSTERECTOMY N/A 12/07/2013   Procedure: ROBOTIC ASSISTED TOTAL HYSTERECTOMY;  Surgeon: Lovenia Kim, MD;  Location: [facility];  Service: Gynecology;  Laterality: N/A;  . SPINE SURGERY       Family History  Problem Relation Age of Onset  . Hiatal hernia Mother   . Irritable bowel syndrome Mother   . Hypertension Mother   . Thyroid disease Mother   . Heart disease Mother 12    AMI, 90% LAD, stent placement  . Cancer Father     lung (+ TOBACCO)  . Hypertension Sister   . Seizures Sister   . Headache Sister   . Colon cancer Neg Hx  Social History   Social History  . Marital status: Married    Spouse name: [name]  . Number of children: 2  . Years of education: 16   Occupational History  . [employer]    Social History Main Topics  . Smoking status: Never Smoker  . Smokeless tobacco: Never Used  . Alcohol use 3.0 oz/week    5 Glasses of wine per week  . Drug use: No  . Sexual activity: Yes    Partners: Male    Birth control/ protection: None     Comment: h/o infertility   Other Topics Concern  . Not on file   Social History Narrative   Patient is married [name]) lives with her husband (2nd marriage, [employer] [employer]). Two daughters live with them.  Oldest daughter is grown and lives locally with her husband and children.    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   Patient is right-handed.   Patient drinks one cup of caffeine daily.     BP 114/82   Pulse 85   Ht 5\' 4"  (1.626 m)   Wt 153 lb 12.8 oz (69.8 kg)   LMP 12/03/2014   BMI 26.40 kg/m   Physical Exam:  Well appearing middle-aged woman, NAD HEENT: Unremarkable Neck:  6 cm JVD, no thyromegally Lungs:  Clear with no wheezes, rales, or rhonchi. HEART:  Regular rate rhythm, no murmurs, no rubs, no clicks Abd:  soft, positive bowel sounds, no organomegally, no rebound, no guarding Ext:  2 plus pulses, no edema, no cyanosis, no clubbing Skin:  No rashes no nodules Neuro:  CN II through XII intact, motor grossly intact  EKG normal sinus rhythm, with normal axis and intervals. NSST abnormality.  Assess/Plan:  1. PVC's/RVOT Vt - she is stable. We discussed possibly weaning off of the flecainide. She will attempt to but knows to restart the flecainide if her symptoms return. 2. Dyslipidemia - she will continue her statin drug.  Mikle Bosworth.D.

## 2015-08-22 NOTE — Telephone Encounter (Addendum)
Mail order reqs RF of alprazolam. Last RF written 6/28 for #30. Pended.

## 2015-08-22 NOTE — Patient Instructions (Signed)
Medication Instructions:  Your physician recommends that you continue on your current medications as directed. Please refer to the Current Medication list given to you today.  Try HOLDING Flecainide for 1 week RESUME if no improvement  Labwork: None ordered  Testing/Procedures: None ordered.  Follow-Up: Your physician wants you to follow-up in: 1 year with Dr.Taylor You will receive a reminder letter in the mail two months in advance. If you don't receive a letter, please call our office to schedule the follow-up appointment.   Any Other Special Instructions Will Be Listed Below (If Applicable).     If you need a refill on your cardiac medications before your next appointment, please call your pharmacy.

## 2015-08-22 NOTE — Telephone Encounter (Signed)
Meds ordered this encounter  Medications  . ALPRAZolam (XANAX) 1 MG tablet    Sig: TAKE 1 TABLET AT BEDTIME AS NEEDED FOR SLEEP.    Dispense:  30 tablet    Refill:  0    

## 2015-08-23 NOTE — Telephone Encounter (Signed)
Faxed

## 2015-08-24 ENCOUNTER — Encounter: Payer: Self-pay | Admitting: Physician Assistant

## 2015-08-28 MED ORDER — IPRATROPIUM BROMIDE 0.03 % NA SOLN: 2.0000 | Freq: Two times a day (BID) | NASAL | 12 refills | Status: DC

## 2015-08-31 ENCOUNTER — Ambulatory Visit (INDEPENDENT_AMBULATORY_CARE_PROVIDER_SITE_OTHER): Payer: 59 | Admitting: Neurology

## 2015-08-31 ENCOUNTER — Ambulatory Visit (INDEPENDENT_AMBULATORY_CARE_PROVIDER_SITE_OTHER): Payer: Self-pay | Admitting: Neurology

## 2015-08-31 DIAGNOSIS — R202 Paresthesia of skin: Secondary | ICD-10-CM

## 2015-08-31 DIAGNOSIS — R208 Other disturbances of skin sensation: Secondary | ICD-10-CM

## 2015-08-31 DIAGNOSIS — Z0289 Encounter for other administrative examinations: Secondary | ICD-10-CM

## 2015-08-31 NOTE — Progress Notes (Signed)
See nerve conduction EMG report under procedures section.

## 2015-08-31 NOTE — Procedures (Signed)
   NCS (NERVE CONDUCTION STUDY) WITH EMG (ELECTROMYOGRAPHY) REPORT   STUDY DATE: August 31 2015 PATIENT NAME: [name] DOB: August 03, 1965 MRN: [id number]    TECHNOLOGIST: Laretta Alstrom ELECTROMYOGRAPHER: Marcial Pacas M.D.  CLINICAL INFORMATION:  50 years old right-handed female, presenting with few months history of intermittent left arm, left feet paresthesia  On examination: Bilateral upper and lower extremity motor strength was normal, deep tendon reflexes were brisk and symmetric.   FINDINGS: NERVE CONDUCTION STUDY: Left peroneal sensory response was normal. Left peroneal to EDB and tibial motor responses were normal. Bilateral median, ulnar sensory and motor responses were normal.   NEEDLE ELECTROMYOGRAPHY: Selected needle examinations were performed at left upper, left lower extremity muscles, left cervical, left lumbar sacral paraspinal muscles.   Needle examination of left brachial radialis, pronator teres, biceps, triceps, deltoid was normal.  Needle examination of left tibialis anterior, tibialis posterior, peroneal longus, medial gastrocnemius, vastus lateralis was normal.  There was no spontaneous activity at left cervical paraspinal muscles, left C5-6 and 7; or left lumbosacral paraspinal muscles, left L4-5 S1.  IMPRESSION:   This is a normal study. There is no electrodiagnostic evidence of left cervical or lumbar radiculopathy. There is no evidence of left upper extremity neuropathy, or large fiber peripheral neuropathy.   INTERPRETING PHYSICIAN:   Marcial Pacas M.D. Ph.D. [facility] [address], [place], [place] [address] [phone]

## 2015-09-18 ENCOUNTER — Encounter: Payer: 59 | Admitting: Internal Medicine

## 2015-09-21 ENCOUNTER — Encounter: Payer: 59 | Admitting: Neurology

## 2015-09-25 ENCOUNTER — Encounter: Payer: Self-pay | Admitting: Internal Medicine

## 2015-09-25 ENCOUNTER — Ambulatory Visit (INDEPENDENT_AMBULATORY_CARE_PROVIDER_SITE_OTHER): Payer: 59 | Admitting: Internal Medicine

## 2015-09-25 VITALS — BP 108/78 | HR 72 | Ht 64.0 in | Wt 154.1 lb

## 2015-09-25 DIAGNOSIS — Z1211 Encounter for screening for malignant neoplasm of colon: Secondary | ICD-10-CM | POA: Diagnosis not present

## 2015-09-25 DIAGNOSIS — K2100 Gastro-esophageal reflux disease with esophagitis, without bleeding: Secondary | ICD-10-CM

## 2015-09-25 DIAGNOSIS — R1310 Dysphagia, unspecified: Secondary | ICD-10-CM | POA: Diagnosis not present

## 2015-09-25 MED ORDER — NA SULFATE-K SULFATE-MG SULF 17.5-3.13-1.6 GM/177ML PO SOLN: 1.0000 | Freq: Once | ORAL | 0 refills | Status: AC

## 2015-09-25 NOTE — Progress Notes (Signed)
HISTORY OF PRESENT ILLNESS:  [name] is a 50 y.o. female who is sent today by her primary care provider Ms. Darlyn Read PA-C with chief complaint of reflux, mild dysphagia, and the need for colon cancer screening. She was last seen in this office in 2009 for right sided pain not felt to be GI in nature. She tells me that she subsequently had hysterectomy for large fibroids. She did undergo colonoscopy and upper endoscopy in 2005 to evaluate abdominal pain, diarrhea, and reflux symptoms. Complete colonoscopy with intubation of the ileum was normal. Colonic biopsies were normal. Upper endoscopy revealed mild reflux esophagitis but was otherwise normal. Biopsies were normal. Patient tells me that several years ago she had problems with chronic cough. He was not on PPI that time. [name] nose and throat specialist to diagnose reflux disease and placed her on 40 mg of pantoprazole twice daily. On medication she states that symptoms are improved. Off medication or on once daily PPI she will have some cough and pyrosis. She asked about medication use long-term. She does report some mild intermittent dysphagia and requests upper endoscopy. She denies lower GI complaints except for occasional constipation. There is a family history of colon polyps in her and and sibling but no colon cancer.  REVIEW OF SYSTEMS:  All non-GI ROS negative except for anxiety, headache  Past Medical History:  Diagnosis Date  . Anxiety   . Arrhythmia ventricular    s/p ablation 2001  . Carpal tunnel syndrome, bilateral   . Chronic RLQ pain   . Dyslipidemia   . Dysrhythmia    V- tach- resolved with ablation 2002  . GERD (gastroesophageal reflux disease)   . Headache   . Hiatal hernia   . History of migraine headaches   . IBS (irritable bowel syndrome)   . Insomnia   . Menorrhagia   . Mitral valve disorder   . MVP (mitral valve prolapse)   . Plantar fasciitis   . PONV (postoperative nausea and vomiting)   .  Uterine fibroid    small  . Ventricular tachycardia [name])     Past Surgical History:  Procedure Laterality Date  . ABDOMINAL HYSTERECTOMY    . BILATERAL SALPINGECTOMY Bilateral 12/07/2013   Procedure: BILATERAL SALPINGECTOMY;  Surgeon: Sandra Kim, MD;  [facility]: [name];  Service: Gynecology;  Laterality: Bilateral;  . CARDIAC ELECTROPHYSIOLOGY [place] AND ABLATION  2002  . cspine  05/1999   c5-6  . echocardiogram  2000/2001  . KNEE SURGERY  1998  . PATELLA ARTHROPLASTY    . ROBOTIC ASSISTED TOTAL HYSTERECTOMY N/A 12/07/2013   Procedure: ROBOTIC ASSISTED TOTAL HYSTERECTOMY;  Surgeon: Sandra Kim, MD;  Location: [name];  Service: Gynecology;  Laterality: N/A;  . [name]      Social History [name]  reports that she has never smoked. She has never used smokeless tobacco. She reports that she drinks about 3.0 oz of alcohol per week . She reports that she does not use drugs.  family history includes Colitis in her maternal grandmother; Headache in her sister; Heart disease (age of onset: 69) in her mother; Hiatal hernia in her mother; Hypertension in her mother and sister; Irritable bowel syndrome in her mother; Lung cancer in her father; Pancreatic cancer in her paternal uncle; Seizures in her sister; Thyroid disease in her mother.  Allergies  Allergen Reactions  . Covera-Hs [Verapamil]     Blood blisters, Stevens-Johnson syndrome  . Crestor [Rosuvastatin Calcium]  Flu like symptoms, on different statin  . Wellbutrin [Bupropion] Palpitations       PHYSICAL EXAMINATION: Vital signs: BP 108/78   Pulse 72   Ht 5\' 4"  (1.626 m)   Wt 154 lb 2 oz (69.9 kg)   LMP 12/03/2014   BMI 26.46 kg/m   Constitutional: generally well-appearing, no acute distress Psychiatric: alert and oriented x3, cooperative Eyes: extraocular movements intact, anicteric, conjunctiva pink Mouth: oral pharynx moist, no lesions Neck: suppleWithout thyromegaly Lymph: no  lymphadenopathy Cardiovascular: heart regular rate and rhythm, no murmur Lungs: clear to auscultation bilaterally Abdomen: soft, nontender, nondistended, no obvious ascites, no peritoneal signs, normal bowel sounds, no organomegaly Rectal:Deferred until colonoscopy Extremities: no clubbing cyanosis or lower extremity edema bilaterally Skin: no lesions on visible extremities Neuro: No focal deficits. Cranial nerves intact  ASSESSMENT:  #1. GERD. Previous EGD with mild esophagitis. Also saw ENT for the pharyngeal symptom of chronic cough. Symptoms seem controlled on twice a day PPI #2. Mild dysphagia. Rule out stricture #3. Colon cancer screening. Baseline risk. Appropriate candidate without contraindication   PLAN:  #1. Reflux precautions #2. Lowest dose of PPI to control symptoms. We discussed current knowledge base regarding potential untoward effects of long-term chronic PPI use. #3. Schedule upper endoscopy with possible esophageal dilation.The nature of the procedure, as well as the risks, benefits, and alternatives were carefully and thoroughly reviewed with the patient. Ample time for discussion and questions allowed. The patient understood, was satisfied, and agreed to proceed. #4. Schedule screening colonoscopy.The nature of the procedure, as well as the risks, benefits, and alternatives were carefully and thoroughly reviewed with the patient. Ample time for discussion and questions allowed. The patient understood, was satisfied, and agreed to proceed. #5. Would recommend routine GI follow-up to monitor management of chronic GERD and chronic medication use.   A copy of this consultation note has been sent to Ms. Jacqulynn Cadet PA-C

## 2015-09-25 NOTE — Patient Instructions (Signed)

## 2015-09-27 ENCOUNTER — Encounter: Payer: Self-pay | Admitting: Neurology

## 2015-09-27 ENCOUNTER — Ambulatory Visit (INDEPENDENT_AMBULATORY_CARE_PROVIDER_SITE_OTHER): Payer: 59 | Admitting: Neurology

## 2015-09-27 VITALS — BP 114/75 | HR 68 | Resp 16 | Ht 64.0 in | Wt 154.0 lb

## 2015-09-27 DIAGNOSIS — S20161A Insect bite (nonvenomous) of breast, right breast, initial encounter: Secondary | ICD-10-CM

## 2015-09-27 DIAGNOSIS — R278 Other lack of coordination: Secondary | ICD-10-CM | POA: Insufficient documentation

## 2015-09-27 DIAGNOSIS — W57XXXA Bitten or stung by nonvenomous insect and other nonvenomous arthropods, initial encounter: Secondary | ICD-10-CM

## 2015-09-27 DIAGNOSIS — R292 Abnormal reflex: Secondary | ICD-10-CM

## 2015-09-27 DIAGNOSIS — Z736 Limitation of activities due to disability: Secondary | ICD-10-CM

## 2015-09-27 DIAGNOSIS — R251 Tremor, unspecified: Secondary | ICD-10-CM | POA: Diagnosis not present

## 2015-09-27 NOTE — Patient Instructions (Signed)
Tremor °A tremor is trembling or shaking that you cannot control. Most tremors affect the hands or arms. Tremors can also affect the head, vocal cords, face, and other parts of the body. There are many types of tremors. Common types include:  °· Essential tremor. These usually occur in people over the age of 40. It may run in families and can happen in otherwise healthy people.   °· Resting tremor. These occur when the muscles are at rest, such as when your hands are resting in your lap. People with Parkinson disease often have resting tremors.   °· Postural tremor. These occur when you try to hold a pose, such as keeping your hands outstretched.   °· Kinetic tremor. These occur during purposeful movement, such as trying to touch a finger to your nose.   °· Task-specific tremor. These may occur when you perform tasks such as handwriting, speaking, or standing.   °· Psychogenic tremor. These dramatically lessen or disappear when you are distracted. They can happen in people of all ages.   °Some types of tremors have no known cause. Tremors can also be a symptom of nervous system problems (neurological disorders) that may occur with aging. Some tremors go away with treatment while others do not.  °HOME CARE INSTRUCTIONS °Watch your tremor for any changes. The following actions may help to lessen any discomfort you are feeling: °· Take medicines only as directed by your health care provider.   °· Limit alcohol intake to no more than 1 drink per day for nonpregnant women and 2 drinks per day for men. One drink equals 12 oz of beer, 5 oz of wine, or 1½ oz of hard liquor. °· Do not use any tobacco products, including cigarettes, chewing tobacco, or electronic cigarettes. If you need help quitting, ask your health care provider.   °· Avoid extreme heat or cold.    °· Limit the amount of caffeine you consume as directed by your health care provider.   °· Try to get 8 hours of sleep each night. °· Find ways to manage your  stress, such as meditation or yoga. °· Keep all follow-up visits as directed by your health care provider. This is important. °SEEK MEDICAL CARE IF: °· You start having a tremor after starting a new medicine. °· You have tremor with other symptoms such as: °¨ Numbness. °¨ Tingling. °¨ Pain. °¨ Weakness. °· Your tremor gets worse. °· Your tremor interferes with your day-to-day life. °  °This information is not intended to replace advice given to you by your health care provider. Make sure you discuss any questions you have with your health care provider. °  °Document Released: 12/13/2001 Document Revised: 01/13/2014 Document Reviewed: 06/20/2013 °Elsevier Interactive Patient Education ©2016 Elsevier Inc. ° °

## 2015-09-27 NOTE — Progress Notes (Addendum)
CLINIC Note    Provider:  Larey Seat, M D  Referring Provider: Harrison Mons, PA-C Primary Care Physician:  Harrison Mons, PA-C  Chief Complaint  Patient presents with  . Follow-up    Rm 11. Patient is here to discuss EMG/NCV. She reports on going symptoms of numbness, difficulty with writing, and shaking.     HPI:  [name] is a 50 y.o. female , seen here as a referral  from [place] for Evaluation of new onset dizziness, numbness in both arms, headaches delayed word finding, and a history of cervical fusion at C6-7.   I had the pleasure of seeing [name]  in 2010 or 2011 when she presented with an acute first onset of migraines. She soon recovered with medication. It is here today for a new problem in the meantime she has been diagnosed with degenerative disc disease , soon after she  underwent a cervical  fusion. The symptoms that I elicited above may have been partially related to problems at the cervical cord level but there was no significant bony encroachment upon the neural foramina nor exit stenosis nor other fusion and only a tiny right protrusion with annular fissure between C5 and C6.  No impingement of the nerves were noted. She had first  to undergo a cervical spine x-ray which was then followed by an MRI of the cervical spine, ordered by Daphane Shepherd. The cervical spine MRI was performed on 07/12/2015  At [facility] imaging. There was some overall mild is degeneration noted. This is not enough to explain headaches and certainly not tingling sensation in both upper extremities. Both hands tingle , on the dorsal hand and  antebrachial in the same region.  The spinal anatomy does not explain this. She also reports foot weakness, feeling heavy.  She often feels as if her hands and feet are asleep and sometimes she can shake this sensation off, other days it may stay for hours.  She also reports sleep difficulties which have been long-standing. For  the last 6 or 7 years she has been taking 1 mg of alprazolam every night to initiate sleep.  History from 09/27/2015, [name] is seen here today soon after she had undergone a nerve conduction study with my colleague Dr. Krista Blue this concentrated on the left upper extremity and left lower extremity based on the symptoms she had initially presented with. It was an entirely normal study. In the meanwhile since our last visit, the patient has developed a tremor that affects her right ring finger and pinky and she has noticed that when she does repetitive movements with her right hand she seems to feel weaker, a little clumsy perhaps. There is no evidence of any motor neuron abnormality, neuropathy and her spinal anatomy does not really explain this. She had been diagnosed with an annular disc protrusion between C5 and C6 affecting mostly the right spine. She had undergone a cervical fusion between C6 and C7. In spite of the lack of electrodiagnostic evidence, I believe that can be a relation. I do not think that she needs specific treatment at this time she has not noticed any kind of muscle atrophy the fingers are symmetric. And  for her age there is some mild spinal degenerative disc disease expected, she does not experience radiculopathic pain. She has never had an imaging study of the brain, and since she seems to develop a unilateral tremor, I would strongly recommend a brain MRI with and without  contrast at this time to establish a baseline. If the brain MRI is normal and would like to follow her once a year for a tremor evaluation - should the symptoms persist.    Medical history : In February of this year Mrs. half Neal Oshea was bitten by a tick and within days developed a target rash, she presented to [facility] urgent care and was treated with doxycycline for a course of about 12 days. She had developed a fever when she went to the physician aside from the rash and she was diagnosed with very high  inflammatory markers. I will repeat those today.  Lyme negative , RMSF negative,   family sleep history:  Sister has migraines, daughter has migraines, no MS in the family, no PD, grandfather had a stroke at 27.   Social history: married with  2 children , no tobacco products , ETOH 2 a week only.,caffeine : cut out, only 1 glass of tea a week. Works for an Multimedia programmer.   Review of Systems: Out of a complete 14 system review, the patient complains of only the following symptoms, and all other reviewed systems are negative. She further endorsed blurred vision, joint pain, aching muscles, dizziness, weakness, numbness, headaches, insomnia. Has a history of arrhythmia controlled with medication, history of cardiac ablation elevated cholesterol, ruptured disc repair 2000 for wrist fusion, high cholesterol. Surgical history positive for spinal fusion 2003, hysterectomy 2015, cardiac ablation in 2003, knee surgery in [place] History  . Marital status: Married    Spouse name: [name]  . Number of children: 2  . Years of education: 16   Occupational History  . [employer]    Social History Main Topics  . Smoking status: Never Smoker  . Smokeless tobacco: Never Used  . Alcohol use 3.0 oz/week    5 Glasses of wine per week     Comment: occ  . Drug use: No  . Sexual activity: Yes    Partners: Male    Birth control/ protection: None     Comment: h/o infertility   Other Topics Concern  . Not on file   Social History Narrative   Patient is married [name]) lives with her husband (2nd marriage, [employer] [employer] Chief [employer]). Two daughters live with them.  Oldest daughter is grown and lives locally with her husband and children.   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   Patient is right-handed.   Patient drinks one cup of caffeine daily.    Family History  Problem  Relation Age of Onset  . Hiatal hernia Mother   . Irritable bowel syndrome Mother   . Hypertension Mother   . Thyroid disease Mother   . Heart disease Mother 8    AMI, 90% LAD, stent placement, cardiac arrest  . Lung cancer Father     (+ TOBACCO)  . Hypertension Sister   . Colitis Maternal Grandmother     Crohns VS U.C  . Seizures Sister   . Headache Sister   . Pancreatic cancer Paternal Uncle   . Colon cancer Neg Hx     Past Medical History:  Diagnosis Date  . Anxiety   . Arrhythmia ventricular    s/p ablation 2001  . Carpal tunnel syndrome, bilateral   . Chronic RLQ pain   . Dyslipidemia   . Dysrhythmia  V- tach- resolved with ablation 2002  . GERD (gastroesophageal reflux disease)   . Headache   . Hiatal hernia   . History of migraine headaches   . IBS (irritable bowel syndrome)   . Insomnia   . Menorrhagia   . Mitral valve disorder   . MVP (mitral valve prolapse)   . Plantar fasciitis   . PONV (postoperative nausea and vomiting)   . Uterine fibroid    small  . Ventricular tachycardia [facility])     Past Surgical History:  Procedure Laterality Date  . ABDOMINAL HYSTERECTOMY    . BILATERAL SALPINGECTOMY Bilateral 12/07/2013   Procedure: BILATERAL SALPINGECTOMY;  Surgeon: Lovenia Kim, MD;  Location: [facility];  Service: Gynecology;  Laterality: Bilateral;  . CARDIAC ELECTROPHYSIOLOGY [place] AND ABLATION  2002  . cspine  05/1999   c5-6  . echocardiogram  2000/2001  . KNEE SURGERY  1998  . PATELLA ARTHROPLASTY    . ROBOTIC ASSISTED TOTAL HYSTERECTOMY N/A 12/07/2013   Procedure: ROBOTIC ASSISTED TOTAL HYSTERECTOMY;  Surgeon: Lovenia Kim, MD;  Location: [facility];  Service: Gynecology;  Laterality: N/A;  . SPINE SURGERY      Current Outpatient Prescriptions  Medication Sig Dispense Refill  . ALPRAZolam (XANAX) 1 MG tablet TAKE 1 TABLET AT BEDTIME AS NEEDED FOR SLEEP. 30 tablet 0  . atorvastatin (LIPITOR) 20 MG tablet TAKE 1 TABLET BY MOUTH EVERY DAY 90  tablet 3  . cyclobenzaprine (FLEXERIL) 10 MG tablet Take 10 mg by mouth at bedtime as needed.  3  . diclofenac (VOLTAREN) 75 MG EC tablet Take 75 mg by mouth daily as needed.    Marland Kitchen ipratropium (ATROVENT) 0.03 % nasal spray Place 2 sprays into both nostrils 2 (two) times daily. 30 mL 12  . pantoprazole (PROTONIX) 40 MG tablet TAKE 1 TABLET (40 MG TOTAL) BY MOUTH 2 (TWO) TIMES DAILY. 180 tablet 1   No current facility-administered medications for this visit.     Allergies as of 09/27/2015 - Review Complete 09/27/2015  Allergen Reaction Noted  . Covera-hs [verapamil]  05/06/2007  . Crestor [rosuvastatin calcium]  10/15/2010  . Wellbutrin [bupropion] Palpitations 01/02/2015    Vitals: BP 114/75   Pulse 68   Resp 16   Ht 5\' 4"  (1.626 m)   Wt 154 lb (69.9 kg)   LMP 12/03/2014   BMI 26.43 kg/m  Last Weight:  Wt Readings from Last 1 Encounters:  09/27/15 154 lb (69.9 kg)   [id number] mass index is 26.43 kg/m.     Last Height:   Ht Readings from Last 1 Encounters:  09/27/15 5\' 4"  (1.626 m)    Physical exam:  General: The patient is awake, alert and appears not in acute distress. The patient is well groomed. Head: Normocephalic, atraumatic. Neck is supple. Mallampati 3   neck circumference:15 . Nasal airflow patent ,  Cardiovascular:  irregular rate and rhythm , on Flec[place]ide Dr Marya Amsler taylor. without  murmurs or carotid bruit, and without distended neck veins. Respiratory: Lungs are clear to auscultation. Skin:  Without evidence of edema, or rash Trunk: BMI is normal  The patient's posture is erect    Neurologic exam : The patient is awake and alert, oriented to place and time.   Memory subjective  described as intact. Attention span & concentration ability appears normal.  Speech is fluent,  without dysarthria, dysphonia or aphasia.  Mood and affect are appropriate.  Cranial nerves: Pupils are equal and briskly reactive to light. Funduscopic exam without  evidence of pallor or  edema.  Extraocular movements  in vertical and horizontal planes intact and without nystagmus. Visual fields by finger perimetry are intact. Hearing to finger rub intact. Facial sensation intact to fine touch. Facial motor strength is symmetric and tongue and uvula move midline. Shoulder shrug was symmetrical.   Motor exam: Normal tone, muscle bulk and symmetric strength in all extremities. Sensory:  Fine touch, pinprick and vibration were tested in all extremities. Proprioception tested in the upper extremities was normal. Coordination: Rapid alternating movements in the fingers/hands was normal. Finger-to-nose maneuver  normal without evidence of ataxia, dysmetria or tremor. Gait and station: Patient walks without assistive device and is able unassisted to climb up to the exam table. Strength within normal limits.  Stance is stable and normal.  Deep tendon reflexes: in the  upper and lower extremities are symmetric and intact. Babinski maneuver response is downgoing.  The patient was advised of the nature of the diagnosed sleep disorder , the treatment options and risks for general a health and wellness arising from not treating the condition.  I spent more than  35  minutes of face to face time with the patient. Greater than 50% of time was spent in counseling and coordination of care. We have discussed the diagnosis and differential and I answered the patient's questions.     Assessment:  After physical and neurologic examination, review of laboratory studies,  Personal review of imaging studies, reports of other /same  Imaging studies ,  Results of polysomnography/ neurophysiology testing and pre-existing records as far as provided in visit., my assessment is   1) statis post ablation for cardiac arrhythmia. Palpitations are reduced.  2) hand numbness not in dermatomal distribution.  3) negative nerve conduction study EMG for the left upper , and for the left lower extremity.  38 new onset  finger tremor right hand.  5) tick bite in [place], look at inflammatory markers.   Plan:  Treatment plan and additional workup :  I will  order an MRI brain to evaluate for the right hand and finger  tremor, I assured the patient of the good results, of her NCV. EMG, no having radiculopathy and no evidence of  Motoneuron disease.  If negative MRI follow up as needed. Asencion Partridge Obi Scrima MD  09/27/2015   CC: Harrison Mons, [address], [place] [address]

## 2015-09-27 NOTE — Addendum Note (Signed)
Addended by: Larey Seat on: 09/27/2015 01:42 PM   Modules accepted: Orders

## 2015-09-28 LAB — SEDIMENTATION RATE: SED RATE: 2 mm/h (ref 0–40)

## 2015-09-28 LAB — C-REACTIVE PROTEIN: CRP: 4 mg/L (ref 0.0–4.9)

## 2015-10-02 ENCOUNTER — Telehealth: Payer: Self-pay

## 2015-10-02 NOTE — Telephone Encounter (Signed)
-----   Message from Larey Seat, MD sent at 10/02/2015 12:44 PM EDT ----- Sed rate and c reactive protein are in normal range, no sign of high inflammatory markers. CD

## 2015-10-02 NOTE — Telephone Encounter (Signed)
LM (per DPR) with results below.  

## 2015-10-03 ENCOUNTER — Ambulatory Visit
Admission: RE | Admit: 2015-10-03 | Discharge: 2015-10-03 | Disposition: A | Discharge status: Home / Self Care | Payer: 59 | Source: Ambulatory Visit | Attending: Neurology | Admitting: Neurology

## 2015-10-03 ENCOUNTER — Other Ambulatory Visit: Payer: Self-pay | Admitting: Neurology

## 2015-10-03 DIAGNOSIS — Z736 Limitation of activities due to disability: Secondary | ICD-10-CM

## 2015-10-03 DIAGNOSIS — R278 Other lack of coordination: Secondary | ICD-10-CM

## 2015-10-03 DIAGNOSIS — R292 Abnormal reflex: Secondary | ICD-10-CM

## 2015-10-03 DIAGNOSIS — R251 Tremor, unspecified: Secondary | ICD-10-CM

## 2015-10-03 MED ORDER — GADOBENATE DIMEGLUMINE 529 MG/ML IV SOLN: 14.0000 mL | Freq: Once | INTRAVENOUS | Status: DC | PRN

## 2015-10-06 ENCOUNTER — Telehealth: Payer: Self-pay | Admitting: Family Medicine

## 2015-10-06 DIAGNOSIS — F418 Other specified anxiety disorders: Secondary | ICD-10-CM

## 2015-10-06 NOTE — Telephone Encounter (Signed)
Pt needing a refill on Xanax please send thru mail order its in her chart where to send medicine

## 2015-10-08 ENCOUNTER — Other Ambulatory Visit: Payer: Self-pay | Admitting: Internal Medicine

## 2015-10-08 MED ORDER — ALPRAZOLAM 1 MG PO TABS: ORAL_TABLET | ORAL | 0 refills | Status: DC

## 2015-10-08 NOTE — Telephone Encounter (Signed)
Faxed Rx to [facility] and notified pt.

## 2015-10-08 NOTE — Telephone Encounter (Signed)
Meds ordered this encounter  Medications  . ALPRAZolam (XANAX) 1 MG tablet    Sig: TAKE 1 TABLET AT BEDTIME AS NEEDED FOR SLEEP.    Dispense:  30 tablet    Refill:  0    Order Specific Question:   Supervising Provider    Answer:   Brigitte Pulse, EVA N [[id number]]

## 2015-10-10 ENCOUNTER — Telehealth: Payer: Self-pay

## 2015-10-10 MED ORDER — ALPRAZOLAM 1 MG PO TABS: 1.0000 mg | ORAL_TABLET | Freq: Every evening | ORAL | 0 refills | Status: DC | PRN

## 2015-10-10 NOTE — Telephone Encounter (Signed)
Spoke with pt. To tell her prescription is ready to be picked up.

## 2015-10-10 NOTE — Telephone Encounter (Signed)
Meds ordered this encounter  Medications  . ALPRAZolam (XANAX) 1 MG tablet    Sig: Take 1 tablet (1 mg total) by mouth at bedtime as needed for anxiety.    Dispense:  10 tablet    Refill:  0    Order Specific Question:   Supervising Provider    Answer:   Brigitte Pulse, EVA N [[id number]]

## 2015-10-10 NOTE — Telephone Encounter (Signed)
Pt is receiving a script from Pharmacy:  [facility], [place] for ALPRAZolam [name]) 1 MG tablet [id number]. She would like to know if we could send a weeks worth of Xanax to [facility] on [address] because the med will be about a week late. Please advise at [phone]

## 2015-10-10 NOTE — Addendum Note (Signed)
Addended by: Debria Garret L on: 10/10/2015 10:00 AM   Modules accepted: Orders

## 2015-10-11 NOTE — Telephone Encounter (Signed)
error 

## 2015-10-12 ENCOUNTER — Other Ambulatory Visit: Payer: 59

## 2015-10-16 NOTE — Telephone Encounter (Signed)
Checked with pt and she got this #10 from local pharm already.

## 2015-10-24 ENCOUNTER — Encounter: Payer: Self-pay | Admitting: Internal Medicine

## 2015-10-29 ENCOUNTER — Other Ambulatory Visit: Payer: Self-pay | Admitting: Physician Assistant

## 2015-11-01 ENCOUNTER — Other Ambulatory Visit: Payer: Self-pay

## 2015-11-01 MED ORDER — PANTOPRAZOLE SODIUM 40 MG PO TBEC: DELAYED_RELEASE_TABLET | ORAL | 1 refills | Status: DC

## 2015-11-02 ENCOUNTER — Other Ambulatory Visit: Payer: Self-pay

## 2015-11-02 DIAGNOSIS — F418 Other specified anxiety disorders: Secondary | ICD-10-CM

## 2015-11-02 NOTE — Telephone Encounter (Signed)
Last OV 6/27, Got RF of #30 to [facility] 10/2 and #10 to local pharm on 10/4. Pt has appt sch for 12/19.

## 2015-11-05 MED ORDER — ALPRAZOLAM 1 MG PO TABS: ORAL_TABLET | ORAL | 0 refills | Status: DC

## 2015-11-05 NOTE — Telephone Encounter (Signed)
Meds ordered this encounter  Medications  . ALPRAZolam (XANAX) 1 MG tablet    Sig: TAKE 1 TABLET AT BEDTIME AS NEEDED FOR SLEEP.    Dispense:  30 tablet    Refill:  0    

## 2015-11-06 ENCOUNTER — Ambulatory Visit (AMBULATORY_SURGERY_CENTER): Payer: 59 | Admitting: Internal Medicine

## 2015-11-06 ENCOUNTER — Encounter: Payer: Self-pay | Admitting: Internal Medicine

## 2015-11-06 VITALS — BP 127/85 | HR 85 | Temp 99.3°F | Resp 16 | Ht 64.0 in | Wt 154.0 lb

## 2015-11-06 DIAGNOSIS — K219 Gastro-esophageal reflux disease without esophagitis: Secondary | ICD-10-CM | POA: Diagnosis not present

## 2015-11-06 DIAGNOSIS — Z1211 Encounter for screening for malignant neoplasm of colon: Secondary | ICD-10-CM | POA: Diagnosis not present

## 2015-11-06 DIAGNOSIS — Z1212 Encounter for screening for malignant neoplasm of rectum: Secondary | ICD-10-CM | POA: Diagnosis not present

## 2015-11-06 DIAGNOSIS — R1310 Dysphagia, unspecified: Secondary | ICD-10-CM

## 2015-11-06 MED ORDER — SODIUM CHLORIDE 0.9 % IV SOLN: 500.0000 mL | INTRAVENOUS | Status: AC

## 2015-11-06 NOTE — Op Note (Signed)
[place] Patient Name: [name] Procedure Date: 11/06/2015 1:03 PM MRN: [id number] Endoscopist: [name]. [name] , MD Age: 50 Referring MD:  Date of Birth: 01-14-1965 Gender: Female Account #: [id number] Procedure:                Upper GI endoscopy Indications:              Dysphagia, mild. History of reflux Medicines:                Monitored Anesthesia Care Procedure:                Pre-Anesthesia Assessment:                           - Prior to the procedure, a History and Physical                            was performed, and patient medications and                            allergies were reviewed. The patient's tolerance of                            previous anesthesia was also reviewed. The risks                            and benefits of the procedure and the sedation                            options and risks were discussed with the patient.                            All questions were answered, and informed consent                            was obtained. Prior Anticoagulants: The patient has                            taken no previous anticoagulant or antiplatelet                            agents. ASA Grade Assessment: I - A normal, healthy                            patient. After reviewing the risks and benefits,                            the patient was deemed in satisfactory condition to                            undergo the procedure.                           After obtaining informed consent, the endoscope was  passed under direct vision. Throughout the                            procedure, the patient's blood pressure, pulse, and                            oxygen saturations were monitored continuously. The                            Model GIF-[id number]) scope was introduced                            through the mouth, and advanced to the second part                            of duodenum. The upper GI  endoscopy was                            accomplished without difficulty. The patient                            tolerated the procedure well. Scope In: Scope Out: Findings:                 The esophagus was normal. No stricture or other                            abnormality.                           The stomach was normal.                           The examined duodenum was normal.                           The cardia and gastric fundus were normal on                            retroflexion. A small hiatal hernia was present. Complications:            No immediate complications. Estimated Blood Loss:     Estimated blood loss: none. Impression:               - Normal esophagus.                           - Normal stomach.                           - Normal examined duodenum.                           - No specimens collected. Recommendation:           - Reflux precautions.                           -  Resume previous diet.                           - Continue present medications.                           - Routine GI office follow-up one year Sandra Denn N. Sandra Pastor, MD 11/06/2015 1:33:33 PM This report has been signed electronically.

## 2015-11-06 NOTE — Patient Instructions (Signed)
YOU HAD AN ENDOSCOPIC PROCEDURE TODAY AT [facility]:   Refer to the procedure report that was given to you for any specific questions about what was found during the examination.  If the procedure report does not answer your questions, please call your gastroenterologist to clarify.  If you requested that your care partner not be given the details of your procedure findings, then the procedure report has been included in a sealed envelope for you to review at your convenience later.  YOU SHOULD EXPECT: Some feelings of bloating in the abdomen. Passage of more gas than usual.  Walking can help get rid of the air that was put into your GI tract during the procedure and reduce the bloating. If you had a lower endoscopy (such as a colonoscopy or flexible sigmoidoscopy) you may notice spotting of blood in your stool or on the toilet paper. If you underwent a bowel prep for your procedure, you may not have a normal bowel movement for a few days.  Please Note:  You might notice some irritation and congestion in your nose or some drainage.  This is from the oxygen used during your procedure.  There is no need for concern and it should clear up in a day or so.  SYMPTOMS TO REPORT IMMEDIATELY:   Following lower endoscopy (colonoscopy or flexible sigmoidoscopy):  Excessive amounts of blood in the stool  Significant tenderness or worsening of abdominal pains  Swelling of the abdomen that is new, acute  Fever of 100F or higher   Following upper endoscopy (EGD)  Vomiting of blood or coffee ground material  New chest pain or pain under the shoulder blades  Painful or persistently difficult swallowing  New shortness of breath  Fever of 100F or higher  Black, tarry-looking stools  For urgent or emergent issues, a gastroenterologist can be reached at any hour by calling [phone].   DIET:  We do recommend a small meal at first, but then you may proceed to your regular diet.  Drink  plenty of fluids but you should avoid alcoholic beverages for 24 hours.  ACTIVITY:  You should plan to take it easy for the rest of today and you should NOT DRIVE or use heavy machinery until tomorrow (because of the sedation medicines used during the test).    FOLLOW UP: Our staff will call the number listed on your records the next business day following your procedure to check on you and address any questions or concerns that you may have regarding the information given to you following your procedure. If we do not reach you, we will leave a message.  However, if you are feeling well and you are not experiencing any problems, there is no need to return our call.  We will assume that you have returned to your regular daily activities without incident.  If any biopsies were taken you will be contacted by phone or by letter within the next 1-3 weeks.  Please call us at [phone] if you have not heard about the biopsies in 3 weeks.    SIGNATURES/CONFIDENTIALITY: You and/or your care partner have signed paperwork which will be entered into your electronic medical record.  These signatures attest to the fact that that the information above on your After Visit Summary has been reviewed and is understood.  Full responsibility of the confidentiality of this discharge information lies with you and/or your care-partner.YOU HAD AN ENDOSCOPIC PROCEDURE TODAY AT [facility]:  Refer to the procedure report that was given to you for any specific questions about what was found during the examination.  If the procedure report does not answer your questions, please call your gastroenterologist to clarify.  If you requested that your care partner not be given the details of your procedure findings, then the procedure report has been included in a sealed envelope for you to review at your convenience later.  YOU SHOULD EXPECT: Some feelings of bloating in the abdomen. Passage of more gas than  usual.  Walking can help get rid of the air that was put into your GI tract during the procedure and reduce the bloating. If you had a lower endoscopy (such as a colonoscopy or flexible sigmoidoscopy) you may notice spotting of blood in your stool or on the toilet paper. If you underwent a bowel prep for your procedure, you may not have a normal bowel movement for a few days.  Please Note:  You might notice some irritation and congestion in your nose or some drainage.  This is from the oxygen used during your procedure.  There is no need for concern and it should clear up in a day or so.  SYMPTOMS TO REPORT IMMEDIATELY:   Following lower endoscopy (colonoscopy or flexible sigmoidoscopy):  Excessive amounts of blood in the stool  Significant tenderness or worsening of abdominal pains  Swelling of the abdomen that is new, acute  Fever of 100F or higher   Following upper endoscopy (EGD)  Vomiting of blood or coffee ground material  New chest pain or pain under the shoulder blades  Painful or persistently difficult swallowing  New shortness of breath  Fever of 100F or higher  Black, tarry-looking stools  For urgent or emergent issues, a gastroenterologist can be reached at any hour by calling [phone].  Please read all handouts given to you by your recovery nurse.   DIET:  We do recommend a small meal at first, but then you may proceed to your regular diet.  Drink plenty of fluids but you should avoid alcoholic beverages for 24 hours.  ACTIVITY:  You should plan to take it easy for the rest of today and you should NOT DRIVE or use heavy machinery until tomorrow (because of the sedation medicines used during the test).    FOLLOW UP: Our staff will call the number listed on your records the next business day following your procedure to check on you and address any questions or concerns that you may have regarding the information given to you following your procedure. If we do not  reach you, we will leave a message.  However, if you are feeling well and you are not experiencing any problems, there is no need to return our call.  We will assume that you have returned to your regular daily activities without incident.  If any biopsies were taken you will be contacted by phone or by letter within the next 1-3 weeks.  Please call us at [phone] if you have not heard about the biopsies in 3 weeks.    SIGNATURES/CONFIDENTIALITY: You and/or your care partner have signed paperwork which will be entered into your electronic medical record.  These signatures attest to the fact that that the information above on your After Visit Summary has bee  Thank you for letting us take care of your healthcare needs today.  n reviewed and is understood.  Full responsibility of the confidentiality of this discharge information lies with you  and/or your care-partner. 

## 2015-11-06 NOTE — Telephone Encounter (Signed)
Pt called about RX for Xanax I lmom that some was sent to Pharmacy and the rest was [facility] and to make sure she follow up at appt

## 2015-11-06 NOTE — Progress Notes (Signed)
A and O x3. Report to RN. Tolerated MAC anesthesia well.Teeth unchanged after procedure. 

## 2015-11-06 NOTE — Telephone Encounter (Signed)
Pt would like for someone to give her a call back she's confused about med refill please respond

## 2015-11-06 NOTE — Telephone Encounter (Signed)
Pt has gotten conflicting reports from [facility] as to status of order for alprazolam (latest says cancelled), but got a notice from local [facility] that they have a Rx for her. She will check w/both and let me know if something is needed to straighten the pharms out.

## 2015-11-06 NOTE — Op Note (Signed)
[place] Patient Name: [name] Procedure Date: 11/06/2015 1:03 PM MRN: [id number] Endoscopist: Docia Chuck. Henrene Pastor , MD Age: 50 Referring MD:  Date of Birth: 09/22/1965 Gender: Female Account #: [id number] Procedure:                Colonoscopy Indications:              Screening for colorectal malignant neoplasm.                            Negative exam 2005 Medicines:                Monitored Anesthesia Care Procedure:                Pre-Anesthesia Assessment:                           - Prior to the procedure, a History and Physical                            was performed, and patient medications and                            allergies were reviewed. The patient's tolerance of                            previous anesthesia was also reviewed. The risks                            and benefits of the procedure and the sedation                            options and risks were discussed with the patient.                            All questions were answered, and informed consent                            was obtained. Prior Anticoagulants: The patient has                            taken no previous anticoagulant or antiplatelet                            agents. ASA Grade Assessment: I - A normal, healthy                            patient. After reviewing the risks and benefits,                            the patient was deemed in satisfactory condition to                            undergo the procedure.  After obtaining informed consent, the colonoscope                            was passed under direct vision. Throughout the                            procedure, the patient's blood pressure, pulse, and                            oxygen saturations were monitored continuously. The                            Model CF-HQ190L [id number]) scope was introduced                            through the anus and advanced to the the cecum,                       identified by appendiceal orifice and ileocecal                            valve. The ileocecal valve, appendiceal orifice,                            and rectum were photographed. The quality of the                            bowel preparation was excellent. The colonoscopy                            was performed without difficulty. The patient                            tolerated the procedure well. The bowel preparation                            used was SUPREP. Scope In: 1:14:57 PM Scope Out: 1:25:34 PM Scope Withdrawal Time: 0 hours 7 minutes 44 seconds  Total Procedure Duration: 0 hours 10 minutes 37 seconds  Findings:                 The entire examined colon appeared normal on direct                            and retroflexion views. Complications:            No immediate complications. Estimated blood loss:                            None. Estimated Blood Loss:     Estimated blood loss: none. Impression:               - The entire examined colon is normal on direct and                            retroflexion views.                           -  No specimens collected. Recommendation:           - Repeat colonoscopy in 10 years for screening                            purposes.                           - Patient has a contact number available for                            emergencies. The signs and symptoms of potential                            delayed complications were discussed with the                            patient. Return to normal activities tomorrow.                            Written discharge instructions were provided to the                            patient.                           - Resume previous diet.                           - Continue present medications. Docia Chuck. Henrene Pastor, MD 11/06/2015 1:28:47 PM This report has been signed electronically.

## 2015-11-07 ENCOUNTER — Ambulatory Visit (INDEPENDENT_AMBULATORY_CARE_PROVIDER_SITE_OTHER): Payer: 59 | Admitting: Neurology

## 2015-11-07 ENCOUNTER — Encounter: Payer: Self-pay | Admitting: Neurology

## 2015-11-07 ENCOUNTER — Telehealth: Payer: Self-pay | Admitting: *Deleted

## 2015-11-07 VITALS — BP 116/79 | HR 81 | Resp 20 | Ht 64.0 in | Wt 153.0 lb

## 2015-11-07 DIAGNOSIS — R208 Other disturbances of skin sensation: Secondary | ICD-10-CM

## 2015-11-07 NOTE — Telephone Encounter (Signed)
  Follow up Call-  Call back number 11/06/2015  Post procedure Call Back phone  # [phone]  Permission to leave phone message Yes  Some recent data might be hidden     Patient questions:  Could here the sound of a person driving. Cell phone poor x 2. Could not leave a message at this time.

## 2015-11-07 NOTE — Progress Notes (Signed)
CLINIC Note    Provider:  Larey Seat, M D  Referring Provider: Harrison Mons, PA-C Primary Care Physician:  Harrison Mons, PA-C  Chief Complaint  Patient presents with  . Follow-up    discuss results    HPI:  [name] is a 50 y.o. female , seen here as a referral  from [facility] for Evaluation of new onset dizziness, numbness in both arms, headaches delayed word finding, and a history of cervical fusion at C6-7.   I had the pleasure of seeing [name]  in 2010 or 2011 when she presented with an acute first onset of migraines. She soon recovered with medication. It is here today for a new problem in the meantime she has been diagnosed with degenerative disc disease , soon after she  underwent a cervical  fusion. The symptoms that I elicited above may have been partially related to problems at the cervical cord level but there was no significant bony encroachment upon the neural foramina nor exit stenosis nor other fusion and only a tiny right protrusion with annular fissure between C5 and C6.  No impingement of the nerves were noted. She had first  to undergo a cervical spine x-ray which was then followed by an MRI of the cervical spine, ordered by Daphane Shepherd. The cervical spine MRI was performed on 07/12/2015  At [facility] imaging. There was some overall mild is degeneration noted. This is not enough to explain headaches and certainly not tingling sensation in both upper extremities. Both hands tingle , on the dorsal hand and  antebrachial in the same region.  The spinal anatomy does not explain this. She also reports foot weakness, feeling heavy.  She often feels as if her hands and feet are asleep and sometimes she can shake this sensation off, other days it may stay for hours.  She also reports sleep difficulties which have been long-standing. For the last 6 or 7 years she has been taking 1 mg of alprazolam every night to initiate sleep.  History from  09/27/2015, [name] is seen here today soon after she had undergone a nerve conduction study with my colleague Dr. Krista Blue this concentrated on the left upper extremity and left lower extremity based on the symptoms she had initially presented with. It was an entirely normal study. In the meanwhile since our last visit, the patient has developed a tremor that affects her right ring finger and pinky and she has noticed that when she does repetitive movements with her right hand she seems to feel weaker, a little clumsy perhaps. There is no evidence of any motor neuron abnormality, neuropathy and her spinal anatomy does not really explain this. She had been diagnosed with an annular disc protrusion between C5 and C6 affecting mostly the right spine. She had undergone a cervical fusion between C6 and C7. In spite of the lack of electrodiagnostic evidence, I believe that can be a relation. I do not think that she needs specific treatment at this time she has not noticed any kind of muscle atrophy the fingers are symmetric. And  for her age there is some mild spinal degenerative disc disease expected, she does not experience radiculopathic pain. She has never had an imaging study of the brain, and since she seems to develop a unilateral tremor, I would strongly recommend a brain MRI with and without contrast at this time to establish a baseline. If the brain MRI is normal and would like to follow  her once a year for a tremor evaluation - should the symptoms persist.  Interval history from 11/07/2015. We are  meeting today to review the MRI results and images of the brain obtained on the 27th of September 2017. This was a normal MRI of the brain without any acute findings, tiny subcortical foci of nonspecific gliosis were mentioned these are not disease specific and often occur once the patient has crossed the 40s. Sedimentation rate and C-reactive protein returned normal - negative inflammatory markers. C  spine related symptoms unlikely , EMG and NCV were normal.  No follow up.     Medical history : 2017 In February of this year [name] was bitten by a tick and within days developed a target rash, she presented to [facility] urgent care and was treated with doxycycline for a course of about 12 days. She had developed a fever when she went to the physician aside from the rash and she was diagnosed with very high inflammatory markers. I will repeat those today.  Lyme negative , RMSF negative, now presenting with right arm and hand shaking, curious if related to previously surgically treated DDD of the cervical spine.  Has a history of arrhythmia controlled with medication, history of cardiac ablation elevated cholesterol, ruptured disc repair 2000 for wrist fusion, high cholesterol. Surgical history positive for spinal fusion 2003, hysterectomy 2015, cardiac ablation in 2003, knee surgery in 1990  Family sleep history:  Sister has migraines, daughter has migraines, no MS in the family, no PD, grandfather had a stroke at 36.  Social history: married with  2 children , no tobacco products , ETOH 2 a week only.,caffeine : cut out, only 1 glass of tea a week. Works for an [employer].   Review of Systems: Out of a complete 14 system review, the patient complains of only the following symptoms, and all other reviewed systems are negative. She further endorsed blurred vision, joint pain, aching muscles, dizziness, weakness, numbness, headaches, insomnia.   Social History   Social History  . Marital status: Married    Spouse name: [name]  . Number of children: 2  . Years of education: 16   Occupational History  . [employer]    Social History Main Topics  . Smoking status: Never Smoker  . Smokeless tobacco: Never Used  . Alcohol use 3.0 oz/week    5 Glasses of wine per week     Comment: occ  . Drug use: No  . Sexual activity: Yes    Partners:  Male    Birth control/ protection: None     Comment: h/o infertility   Other Topics Concern  . Not on file   Social History Narrative   Patient is married [name]) lives with her husband (2nd marriage, [employer]). Two daughters live with them.  Oldest daughter is grown and lives locally with her husband and children.   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   Patient is right-handed.   Patient drinks one cup of caffeine daily.    Family History  Problem Relation Age of Onset  . Hiatal hernia Mother   . Irritable bowel syndrome Mother   . Hypertension Mother   . Thyroid disease Mother   . Heart disease Mother 58    AMI, 90% LAD, stent placement, cardiac arrest  . Lung cancer Father     (+  TOBACCO)  . Hypertension Sister   . Colitis Maternal Grandmother     Crohns VS U.C  . Seizures Sister   . Headache Sister   . Pancreatic cancer Paternal Uncle   . Colon cancer Neg Hx     Past Medical History:  Diagnosis Date  . Anxiety   . Arrhythmia ventricular    s/p ablation 2001  . Carpal tunnel syndrome, bilateral   . Chronic RLQ pain   . Dyslipidemia   . Dysrhythmia    V- tach- resolved with ablation 2002  . GERD (gastroesophageal reflux disease)   . Headache   . Hiatal hernia   . History of migraine headaches   . IBS (irritable bowel syndrome)   . Insomnia   . Menorrhagia   . Mitral valve disorder   . MVP (mitral valve prolapse)   . Plantar fasciitis   . PONV (postoperative nausea and vomiting)   . Uterine fibroid    small  . Ventricular tachycardia [facility])     Past Surgical History:  Procedure Laterality Date  . ABDOMINAL HYSTERECTOMY    . BILATERAL SALPINGECTOMY Bilateral 12/07/2013   Procedure: BILATERAL SALPINGECTOMY;  Surgeon: Lovenia Kim, MD;  Location: [facility];  Service: Gynecology;  Laterality: Bilateral;  . CARDIAC ELECTROPHYSIOLOGY [place] AND ABLATION  2002  . cspine   05/1999   c5-6  . echocardiogram  2000/2001  . KNEE SURGERY  1998  . PATELLA ARTHROPLASTY    . ROBOTIC ASSISTED TOTAL HYSTERECTOMY N/A 12/07/2013   Procedure: ROBOTIC ASSISTED TOTAL HYSTERECTOMY;  Surgeon: Lovenia Kim, MD;  Location: [facility];  Service: Gynecology;  Laterality: N/A;  . SPINE SURGERY      Current Outpatient Prescriptions  Medication Sig Dispense Refill  . ALPRAZolam (XANAX) 1 MG tablet TAKE 1 TABLET AT BEDTIME AS NEEDED FOR SLEEP. 30 tablet 0  . atorvastatin (LIPITOR) 20 MG tablet TAKE 1 TABLET BY MOUTH EVERY DAY 90 tablet 3  . ipratropium (ATROVENT) 0.03 % nasal spray Place 2 sprays into both nostrils 2 (two) times daily. 30 mL 12  . pantoprazole (PROTONIX) 40 MG tablet TAKE 1 TABLET (40 MG TOTAL) BY MOUTH 2 (TWO) TIMES DAILY. 180 tablet 1  . cyclobenzaprine (FLEXERIL) 10 MG tablet Take 10 mg by mouth at bedtime as needed.  3  . diclofenac (VOLTAREN) 75 MG EC tablet Take 75 mg by mouth daily as needed.     Current Facility-Administered Medications  Medication Dose Route Frequency Provider Last Rate Last Dose  . 0.9 %  sodium chloride infusion  500 mL Intravenous Continuous Irene Shipper, MD        Allergies as of 11/07/2015 - Review Complete 11/07/2015  Allergen Reaction Noted  . Covera-hs [verapamil]  05/06/2007  . Crestor [rosuvastatin calcium]  10/15/2010  . Wellbutrin [bupropion] Palpitations 01/02/2015    Vitals: BP 116/79   Pulse 81   Resp 20   Ht 5\' 4"  (1.626 m)   Wt 153 lb (69.4 kg)   LMP 12/03/2014   BMI 26.26 kg/m  Last Weight:  Wt Readings from Last 1 Encounters:  11/07/15 153 lb (69.4 kg)   [id number] mass index is 26.26 kg/m.     Last Height:   Ht Readings from Last 1 Encounters:  11/07/15 5\' 4"  (1.626 m)    Physical exam:  General: The patient is awake, alert and appears not in acute distress. The patient is well groomed. Head: Normocephalic, atraumatic. Neck is supple. Mallampati 3   neck circumference:15 .  Nasal airflow patent ,    Cardiovascular:  irregular rate and rhythm , not longer on Fleccainide - Dr Crissie Sickles. Ablation .  Without murmurs or carotid bruit, and without distended neck veins. Respiratory: Lungs are clear to auscultation. Skin:  Without evidence of edema, or rash Trunk: BMI is normal   Neurologic exam : The patient is awake and alert, oriented to place and time.   Mood and affect are appropriate.  Cranial nerves: Pupils are equal and briskly reactive to light. Visual fields by finger perimetry are intact. Hearing to finger rub intact. Facial sensation intact to fine touch. Facial motor strength is symmetric and tongue and uvula move midline. Shoulder shrug was symmetrical.   Motor exam: Normal tone, muscle bulk and symmetric strength in all extremities. Sensory:  Fine touch, pinprick and vibration were tested in all extremities. Proprioception tested in the upper extremities was normal. Recurrent numbness and tingling in the left hand, but not dermatomal.  Finger-to-nose maneuver  normal without evidence of ataxia, dysmetria or tremor. Gait and station: Patient walks without assistive device .  The patient was advised of the nature of the diagnosed sleep disorder , the treatment options and risks for general a health and wellness arising from not treating the condition.  I spent more than 15  minutes of face to face time with the patient. Greater than 50% of time was spent in counseling and coordination of care. We have discussed the diagnosis and differential and I answered the patient's questions.     Assessment:  After physical and neurologic examination, review of laboratory studies,  Personal review of imaging studies, reports of other /same  Imaging studies ,  Results of polysomnography/ neurophysiology testing and pre-existing records as far as provided in visit., my assessment is   1) statis post ablation for cardiac arrhythmia. Palpitations are reduced. She still feels and in no  jitteriness, but she is not extremely anxious or tense. 2) hand numbness not in dermatomal distribution. with negative nerve conduction study EMG for the left upper , and for the left lower extremity. Dr Krista Blue  new onset finger tremor right hand has resolved, numbness and tingling are still present.  4) because of a  tick bite in spring time ,  I looked at inflammatory markers.  The inflammatory markers including a C reactive protein and sedimentation rate returned normal. The patient no longer has positive Lyme disease markers.   Negative MRI and NCV EMG, negative lab tests- no follow up needed.  The DDD in the cervical spine is not causing these symptoms. She lacks hyperreflexia , incontinence or other myelopathy symptoms.  Follow up with Quay Burow, PA    Asencion Partridge Goble Fudala MD  11/07/2015   CC: Harrison Mons, [place],  [id number]

## 2015-11-30 ENCOUNTER — Other Ambulatory Visit: Payer: Self-pay | Admitting: Physician Assistant

## 2015-11-30 ENCOUNTER — Encounter: Payer: Self-pay | Admitting: Physician Assistant

## 2015-11-30 DIAGNOSIS — F418 Other specified anxiety disorders: Secondary | ICD-10-CM

## 2015-12-04 ENCOUNTER — Other Ambulatory Visit: Payer: Self-pay | Admitting: Physician Assistant

## 2015-12-04 DIAGNOSIS — F418 Other specified anxiety disorders: Secondary | ICD-10-CM

## 2015-12-05 MED ORDER — ALPRAZOLAM 1 MG PO TABS: ORAL_TABLET | ORAL | 0 refills | Status: DC

## 2015-12-05 NOTE — Telephone Encounter (Signed)
Called in.

## 2015-12-05 NOTE — Telephone Encounter (Signed)
Meds ordered this encounter  Medications  . ALPRAZolam (XANAX) 1 MG tablet    Sig: TAKE 1 TABLET AT BEDTIME AS NEEDED FOR SLEEP.    Dispense:  30 tablet    Refill:  0    Order Specific Question:   Supervising Provider    Answer:   Shawnee Knapp [[id number]]    Please fax to [facility] on [address].

## 2015-12-25 ENCOUNTER — Ambulatory Visit (INDEPENDENT_AMBULATORY_CARE_PROVIDER_SITE_OTHER): Payer: 59 | Admitting: Physician Assistant

## 2015-12-25 ENCOUNTER — Encounter: Payer: Self-pay | Admitting: Physician Assistant

## 2015-12-25 VITALS — BP 110/66 | HR 82 | Temp 97.3°F | Resp 16 | Ht 65.0 in | Wt 160.2 lb

## 2015-12-25 DIAGNOSIS — R454 Irritability and anger: Secondary | ICD-10-CM

## 2015-12-25 DIAGNOSIS — F418 Other specified anxiety disorders: Secondary | ICD-10-CM

## 2015-12-25 DIAGNOSIS — R739 Hyperglycemia, unspecified: Secondary | ICD-10-CM

## 2015-12-25 DIAGNOSIS — Z23 Encounter for immunization: Secondary | ICD-10-CM | POA: Diagnosis not present

## 2015-12-25 DIAGNOSIS — E785 Hyperlipidemia, unspecified: Secondary | ICD-10-CM | POA: Diagnosis not present

## 2015-12-25 DIAGNOSIS — Z1231 Encounter for screening mammogram for malignant neoplasm of breast: Secondary | ICD-10-CM | POA: Diagnosis not present

## 2015-12-25 MED ORDER — ALPRAZOLAM 1 MG PO TABS: ORAL_TABLET | ORAL | 0 refills | Status: DC

## 2015-12-25 MED ORDER — SERTRALINE HCL 50 MG PO TABS: 50.0000 mg | ORAL_TABLET | Freq: Every day | ORAL | 3 refills | Status: DC

## 2015-12-25 NOTE — Patient Instructions (Addendum)
For the first week on sertraline (Zoloft), take 1/2 tablet, then increase to 1 whole tablet. Let me know how you're doing in 4-6 weeks.    IF you received an x-ray today, you will receive an invoice from [facility]. Please contact [facility] at [phone] with questions or concerns regarding your invoice.   IF you received labwork today, you will receive an invoice from [facility]. Please contact [facility] at [phone] with questions or concerns regarding your invoice.   Our billing staff will not be able to assist you with questions regarding bills from these companies.  You will be contacted with the lab results as soon as they are available. The fastest way to get your results is to activate your My Chart account. Instructions are located on the last page of this paperwork. If you have not heard from [place] regarding the results in 2 weeks, please contact this office.

## 2015-12-25 NOTE — Progress Notes (Signed)
Patient ID: [name], female    DOB: 1965-01-22, 50 y.o.   MRN: [id number]  PCP: Harrison Mons, PA-C  Chief Complaint  Patient presents with  . Follow-up    MEDICATION    Subjective:   Presents for evaluation of hyperlipidemia.  Since her last routine visit with me she has seen neurology, GI and cardiology.  06/2015: TC 204 LDL 124 TG 141 HDL 52 Glucose 104  Took fluoxetine for many years, but had a lot of weight gain. Wellbutrin caused racing heart. Her husband has mentioned her mood. She's more irritable, some depression. She's tried St. John's Wort, Vitamin D, Fish Oil (caused ankle swelling). Interested in restarting treatment, but is concerned about potential weight gain.  Has been taken off the Tambocor.   Review of Systems As above, otherwise negative.    Patient Active Problem List   Diagnosis Date Noted  . Tremor of right hand 09/27/2015  . Clumsiness 09/27/2015  . Dysesthesia affecting both sides of body 08/14/2015  . BMI 25.0-25.9,adult 07/03/2015  . DDD (degenerative disc disease), cervical 07/03/2015  . Fibroids, submucosal 12/07/2013  . Depression with anxiety 10/19/2013  . Cough 10/19/2013  . Mild obesity 09/07/2013  . Hyperlipidemia 10/04/2008  . VENTRICULAR TACHYCARDIA 10/04/2008  . RLQ PAIN 06/07/2007  . CARPAL TUNNEL SYNDROME, BILATERAL 05/06/2007  . MITRAL VALVE DISORDERS 05/06/2007  . ABNORMAL HEART RHYTHMS 05/06/2007  . GERD 05/06/2007  . HIATAL HERNIA 05/06/2007  . IBS 05/06/2007  . PLANTAR FASCIITIS 05/06/2007     Prior to Admission medications   Medication Sig Start Date End Date Taking? Authorizing Provider  ALPRAZolam [name]) 1 MG tablet TAKE 1 TABLET AT BEDTIME AS NEEDED FOR SLEEP. 12/05/15   Tryphena Perkovich, PA-C  atorvastatin (LIPITOR) 20 MG tablet TAKE 1 TABLET BY MOUTH EVERY DAY 12/25/14   Kalie Cabral, PA-C  cyclobenzaprine (FLEXERIL) 10 MG tablet Take 10 mg by mouth at bedtime as needed. 07/13/15    Historical Provider, MD  diclofenac (VOLTAREN) 75 MG EC tablet Take 75 mg by mouth daily as needed.    Historical Provider, MD  ipratropium (ATROVENT) 0.03 % nasal spray Place 2 sprays into both nostrils 2 (two) times daily. 08/28/15   Lyly Canizales, PA-C  pantoprazole (PROTONIX) 40 MG tablet TAKE 1 TABLET (40 MG TOTAL) BY MOUTH 2 (TWO) TIMES DAILY. 11/01/15   Irene Shipper, MD     Allergies  Allergen Reactions  . Covera-Hs [Verapamil]     Blood blisters, Stevens-Johnson syndrome  . Crestor [Rosuvastatin Calcium]     Flu like symptoms, on different statin  . Wellbutrin [Bupropion] Palpitations       Objective:  Physical Exam  Constitutional: She is oriented to person, place, and time. She appears well-developed and well-nourished. She is active and cooperative. No distress.  BP 110/66 (BP Location: Left Arm, Patient Position: Sitting, Cuff Size: Normal)   Pulse 82   Temp 97.3 F (36.3 C) (Oral)   Resp 16   Ht 5\' 5"  (1.651 m)   Wt 160 lb 3.2 oz (72.7 kg)   LMP 12/03/2014   SpO2 99%   BMI 26.66 kg/m   HENT:  Head: Normocephalic and atraumatic.  Right Ear: Hearing normal.  Left Ear: Hearing normal.  Eyes: Conjunctivae are normal. No scleral icterus.  Neck: Normal range of motion. Neck supple. No thyromegaly present.  Cardiovascular: Normal rate, regular rhythm and normal heart sounds.   Pulses:      Radial pulses are 2+ on the right  side, and 2+ on the left side.  Pulmonary/Chest: Effort normal and breath sounds normal.  Lymphadenopathy:       Head (right side): No tonsillar, no preauricular, no posterior auricular and no occipital adenopathy present.       Head (left side): No tonsillar, no preauricular, no posterior auricular and no occipital adenopathy present.    She has no cervical adenopathy.       Right: No supraclavicular adenopathy present.       Left: No supraclavicular adenopathy present.  Neurological: She is alert and oriented to person, place, and time. No  sensory deficit.  Skin: Skin is warm, dry and intact. No rash noted. No cyanosis or erythema. Nails show no clubbing.  Psychiatric: She has a normal mood and affect. Her speech is normal and behavior is normal.           Assessment & Plan:   1. Hyperlipidemia, unspecified hyperlipidemia type Await labs. Adjust regimen as indicated by results. - Comprehensive metabolic panel - Lipid panel  2. Hyperglycemia Await lab results. - Comprehensive metabolic panel - Hemoglobin A1c  3. Need for influenza vaccination - Flu Vaccine QUAD 36+ mos IM  4. Encounter for screening mammogram for breast cancer She will schedule.  5. Irritability Likely due to depression/anxiety. Try sertraline. Start with 1/2 tablet x 1 week, then increase to whole tablet. Let me know in 4-6 weeks how it's working. - sertraline (ZOLOFT) 50 MG tablet; Take 1 tablet (50 mg total) by mouth daily.  Dispense: 30 tablet; Refill: 3  7. Depression with anxiety Add sertraline, as above. Continue HS alprazolam as needed. - ALPRAZolam (XANAX) 1 MG tablet; TAKE 1 TABLET AT BEDTIME AS NEEDED FOR SLEEP.  Dispense: 30 tablet; Refill: 0   Return in about 6 months (around 06/24/2016).   Fara Chute, PA-C Physician Assistant-Certified [facility]

## 2015-12-26 LAB — LIPID PANEL
CHOLESTEROL TOTAL: 202 mg/dL — AB (ref 100–199)
Chol/HDL Ratio: 2.7 ratio units (ref 0.0–4.4)
HDL: 76 mg/dL (ref >39)
LDL Calculated: 108 mg/dL — ABNORMAL HIGH (ref 0–99)
TRIGLYCERIDES: 90 mg/dL (ref 0–149)
VLDL CHOLESTEROL CAL: 18 mg/dL (ref 5–40)

## 2015-12-26 LAB — COMPREHENSIVE METABOLIC PANEL
ALK PHOS: 69 IU/L (ref 39–117)
ALT: 16 IU/L (ref 0–32)
AST: 15 IU/L (ref 0–40)
Albumin/Globulin Ratio: 2.2 (ref 1.2–2.2)
Albumin: 5.1 g/dL (ref 3.5–5.5)
BILIRUBIN TOTAL: 0.5 mg/dL (ref 0.0–1.2)
BUN/Creatinine Ratio: 28 — ABNORMAL HIGH (ref 9–23)
BUN: 25 mg/dL — AB (ref 6–24)
CHLORIDE: 97 mmol/L (ref 96–106)
CO2: 25 mmol/L (ref 18–29)
CREATININE: 0.88 mg/dL (ref 0.57–1.00)
Calcium: 9.9 mg/dL (ref 8.7–10.2)
GFR calc Af Amer: 87 mL/min/{1.73_m2} (ref >59)
GFR calc non Af Amer: 76 mL/min/{1.73_m2} (ref >59)
GLUCOSE: 91 mg/dL (ref 65–99)
Globulin, Total: 2.3 g/dL (ref 1.5–4.5)
Potassium: 5.1 mmol/L (ref 3.5–5.2)
Sodium: 141 mmol/L (ref 134–144)
Total Protein: 7.4 g/dL (ref 6.0–8.5)

## 2015-12-26 LAB — HEMOGLOBIN A1C
Est. average glucose Bld gHb Est-mCnc: 100 mg/dL
Hgb A1c MFr Bld: 5.1 % (ref 4.8–5.6)

## 2016-01-28 ENCOUNTER — Ambulatory Visit (INDEPENDENT_AMBULATORY_CARE_PROVIDER_SITE_OTHER): Payer: 59 | Admitting: Physician Assistant

## 2016-01-28 VITALS — BP 132/70 | HR 97 | Temp 97.9°F | Resp 16 | Ht 65.0 in | Wt 161.4 lb

## 2016-01-28 DIAGNOSIS — I472 Ventricular tachycardia: Secondary | ICD-10-CM

## 2016-01-28 DIAGNOSIS — I4729 Other ventricular tachycardia: Secondary | ICD-10-CM

## 2016-01-28 DIAGNOSIS — J019 Acute sinusitis, unspecified: Secondary | ICD-10-CM

## 2016-01-28 MED ORDER — BENZONATATE 100 MG PO CAPS: 100.0000 mg | ORAL_CAPSULE | Freq: Three times a day (TID) | ORAL | 0 refills | Status: DC | PRN

## 2016-01-28 MED ORDER — AMOXICILLIN-POT CLAVULANATE 875-125 MG PO TABS: 1.0000 | ORAL_TABLET | Freq: Two times a day (BID) | ORAL | 0 refills | Status: DC

## 2016-01-28 MED ORDER — HYDROCOD POLST-CPM POLST ER 10-8 MG/5ML PO SUER: 5.0000 mL | Freq: Two times a day (BID) | ORAL | 0 refills | Status: DC | PRN

## 2016-01-28 NOTE — Patient Instructions (Addendum)
We recommend that you schedule a mammogram for breast cancer screening. Typically, you do not need a referral to do this. Please contact a local imaging center to schedule your mammogram.  [facility] - [phone]  *ask for the Radiology Department [facility] [place] ([place]) - [phone] or [phone]  [facility] - [phone] [place] [phone] [facility] - [phone]  *ask for the [facility] - [phone]  *ask for the Radiology Department [facility] - [phone]  *ask for the [place] - [phone]     IF you received an x-ray today, you will receive an invoice from [facility]. Please contact [facility] at [phone] with questions or concerns regarding your invoice.   IF you received labwork today, you will receive an invoice from [place]. Please contact [facility] at [phone] with questions or concerns regarding your invoice.   Our billing staff will not be able to assist you with questions regarding bills from these companies.  You will be contacted with the lab results as soon as they are available. The fastest way to get your results is to activate your My Chart account. Instructions are located on the last page of this paperwork. If you have not heard from [place] regarding the results in 2 weeks, please contact this office.     Sinusitis, Adult Sinusitis is soreness and inflammation of your sinuses. Sinuses are hollow spaces in the bones around your face. Your sinuses are located:  Around your eyes.  In the middle of your forehead.  Behind your nose.  In your cheekbones. Your sinuses and nasal passages are lined with a stringy fluid (mucus). Mucus normally drains out of your sinuses. When your nasal tissues become inflamed or swollen, the mucus can become trapped or blocked so air  cannot flow through your sinuses. This allows bacteria, viruses, and funguses to grow, which leads to infection. Sinusitis can develop quickly and last for 7?10 days (acute) or for more than 12 weeks (chronic). Sinusitis often develops after a cold. What are the causes? This condition is caused by anything that creates swelling in the sinuses or stops mucus from draining, including:  Allergies.  Asthma.  Bacterial or viral infection.  Abnormally shaped bones between the nasal passages.  Nasal growths that contain mucus (nasal polyps).  Narrow sinus openings.  Pollutants, such as chemicals or irritants in the air.  A foreign object stuck in the nose.  A fungal infection. This is rare. What increases the risk? The following factors may make you more likely to develop this condition:  Having allergies or asthma.  Having had a recent cold or respiratory tract infection.  Having structural deformities or blockages in your nose or sinuses.  Having a weak immune system.  Doing a lot of swimming or diving.  Overusing nasal sprays.  Smoking. What are the signs or symptoms? The main symptoms of this condition are pain and a feeling of pressure around the affected sinuses. Other symptoms include:  Upper toothache.  Earache.  Headache.  Bad breath.  Decreased sense of smell and taste.  A cough that may get worse at night.  Fatigue.  Fever.  Thick drainage from your nose. The drainage is often green and it may contain pus (purulent).  Stuffy nose or congestion.  Postnasal drip. This is when extra mucus collects in the throat or back of the nose.  Swelling and  warmth over the affected sinuses.  Sore throat.  Sensitivity to light. How is this diagnosed? This condition is diagnosed based on symptoms, a medical history, and a physical exam. To find out if your condition is acute or chronic, your health care provider may:  Look in your nose for signs of nasal  polyps.  Tap over the affected sinus to check for signs of infection.  View the inside of your sinuses using an imaging device that has a light attached (endoscope). If your health care provider suspects that you have chronic sinusitis, you may also:  Be tested for allergies.  Have a sample of mucus taken from your nose (nasal culture) and checked for bacteria.  Have a mucus sample examined to see if your sinusitis is related to an allergy. If your sinusitis does not respond to treatment and it lasts longer than 8 weeks, you may have an MRI or CT scan to check your sinuses. These scans also help to determine how severe your infection is. In rare cases, a bone biopsy may be done to rule out more serious types of fungal sinus disease. How is this treated? Treatment for sinusitis depends on the cause and whether your condition is chronic or acute. If a virus is causing your sinusitis, your symptoms will go away on their own within 10 days. You may be given medicines to relieve your symptoms, including:  Topical nasal decongestants. They shrink swollen nasal passages and let mucus drain from your sinuses.  Antihistamines. These drugs block inflammation that is triggered by allergies. This can help to ease swelling in your nose and sinuses.  Topical nasal corticosteroids. These are nasal sprays that ease inflammation and swelling in your nose and sinuses.  Nasal saline washes. These rinses can help to get rid of thick mucus in your nose. If your condition is caused by bacteria, you will be given an antibiotic medicine. If your condition is caused by a fungus, you will be given an antifungal medicine. Surgery may be needed to correct underlying conditions, such as narrow nasal passages. Surgery may also be needed to remove polyps. Follow these instructions at home: Medicines  Take, use, or apply over-the-counter and prescription medicines only as told by your health care provider. These may  include nasal sprays.  If you were prescribed an antibiotic medicine, take it as told by your health care provider. Do not stop taking the antibiotic even if you start to feel better. Hydrate and Humidify  Drink enough water to keep your urine clear or pale yellow. Staying hydrated will help to thin your mucus.  Use a cool mist humidifier to keep the humidity level in your home above 50%.  Inhale steam for 10-15 minutes, 3-4 times a day or as told by your health care provider. You can do this in the bathroom while a hot shower is running.  Limit your exposure to cool or dry air. Rest  Rest as much as possible.  Sleep with your head raised (elevated).  Make sure to get enough sleep each night. General instructions  Apply a warm, moist washcloth to your face 3-4 times a day or as told by your health care provider. This will help with discomfort.  Wash your hands often with soap and water to reduce your exposure to viruses and other germs. If soap and water are not available, use hand sanitizer.  Do not smoke. Avoid being around people who are smoking (secondhand smoke).  Keep all follow-up visits as told  by your health care provider. This is important. Contact a health care provider if:  You have a fever.  Your symptoms get worse.  Your symptoms do not improve within 10 days. Get help right away if:  You have a severe headache.  You have persistent vomiting.  You have pain or swelling around your face or eyes.  You have vision problems.  You develop confusion.  Your neck is stiff.  You have trouble breathing. This information is not intended to replace advice given to you by your health care provider. Make sure you discuss any questions you have with your health care provider. Document Released: 12/23/2004 Document Revised: 08/19/2015 Document Reviewed: 10/18/2014 Elsevier Interactive Patient Education  2017 [employer].  Influenza, Adult Influenza, more  commonly known as "the flu," is a viral infection that primarily affects the respiratory tract. The respiratory tract includes organs that help you breathe, such as the lungs, nose, and throat. The flu causes many common cold symptoms, as well as a high fever and body aches. The flu spreads easily from person to person (is contagious). Getting a flu shot (influenza vaccination) every year is the best way to prevent influenza. What are the causes? Influenza is caused by a virus. You can catch the virus by:  Breathing in droplets from an infected person's cough or sneeze.  Touching something that was recently contaminated with the virus and then touching your mouth, nose, or eyes. What increases the risk? The following factors may make you more likely to get the flu:  Not cleaning your hands frequently with soap and water or alcohol-based hand sanitizer.  Having close contact with many people during cold and flu season.  Touching your mouth, eyes, or nose without washing or sanitizing your hands first.  Not drinking enough fluids or not eating a healthy diet.  Not getting enough sleep or exercise.  Being under a high amount of stress.  Not getting a yearly (annual) flu shot. You may be at a higher risk of complications from the flu, such as a severe lung infection (pneumonia), if you:  Are over the age of 10.  Are pregnant.  Have a weakened disease-fighting system (immune system). You may have a weakened immune system if you:  Have HIV or AIDS.  Are undergoing chemotherapy.  Aretaking medicines that reduce the activity of (suppress) the immune system.  Have a long-term (chronic) illness, such as heart disease, kidney disease, diabetes, or lung disease.  Have a liver disorder.  Are obese.  Have anemia. What are the signs or symptoms? Symptoms of this condition typically last 4-10 days and may include:  Fever.  Chills.  Headache, body aches, or muscle aches.  Sore  throat.  Cough.  Runny or congested nose.  Chest discomfort and cough.  Poor appetite.  Weakness or tiredness (fatigue).  Dizziness.  Nausea or vomiting. How is this diagnosed? This condition may be diagnosed based on your medical history and a physical exam. Your health care provider may do a nose or throat swab test to confirm the diagnosis. How is this treated? If influenza is detected early, you can be treated with antiviral medicine that can reduce the length of your illness and the severity of your symptoms. This medicine may be given by mouth (orally) or through an IV tube that is inserted in one of your veins. The goal of treatment is to relieve symptoms by taking care of yourself at home. This may include taking over-the-counter medicines, drinking plenty  of fluids, and adding humidity to the air in your home. In some cases, influenza goes away on its own. Severe influenza or complications from influenza may be treated in a hospital. Follow these instructions at home:  Take over-the-counter and prescription medicines only as told by your health care provider.  Use a cool mist humidifier to add humidity to the air in your home. This can make breathing easier.  Rest as needed.  Drink enough fluid to keep your urine clear or pale yellow.  Cover your mouth and nose when you cough or sneeze.  Wash your hands with soap and water often, especially after you cough or sneeze. If soap and water are not available, use hand sanitizer.  Stay home from work or school as told by your health care provider. Unless you are visiting your health care provider, try to avoid leaving home until your fever has been gone for 24 hours without the use of medicine.  Keep all follow-up visits as told by your health care provider. This is important. How is this prevented?  Getting an annual flu shot is the best way to avoid getting the flu. You may get the flu shot in late summer, fall, or winter.  Ask your health care provider when you should get your flu shot.  Wash your hands often or use hand sanitizer often.  Avoid contact with people who are sick during cold and flu season.  Eat a healthy diet, drink plenty of fluids, get enough sleep, and exercise regularly. Contact a health care provider if:  You develop new symptoms.  You have:  Chest pain.  Diarrhea.  A fever.  Your cough gets worse.  You produce more mucus.  You feel nauseous or you vomit. Get help right away if:  You develop shortness of breath or difficulty breathing.  Your skin or nails turn a bluish color.  You have severe pain or stiffness in your neck.  You develop a sudden headache or sudden pain in your face or ear.  You cannot stop vomiting. This information is not intended to replace advice given to you by your health care provider. Make sure you discuss any questions you have with your health care provider. Document Released: 12/21/1999 Document Revised: 05/31/2015 Document Reviewed: 10/17/2014 Elsevier Interactive Patient Education  2017 [employer].

## 2016-01-28 NOTE — Progress Notes (Signed)
Patient ID: [name], female    DOB: 1965/02/26, 51 y.o.   MRN: [id number]  PCP: Harrison Mons, PA-C  Chief Complaint  Patient presents with  . Nasal Congestion    aches, fatigue, fever 100.3 over weekend, cough, yellow/greenish plegm    Subjective:   Presents for evaluation of sinus congestion.  Pt is a 51yo caucasian female who presents with sinus congestion x 3 weeks. She states that she has had nasal congestion, rhinorrhea, and cough for three weeks. The past week, her cough has worsened and become more paroxysmal with post-tussive vomiting and her nasal drainage has become thicker. She states that, two days ago, she ran a fever (Tmax 100.4) and had some associated chills and myalgias. She denies abdominal pain, diarrhea, or constipation.    Review of Systems In addition to that mentioned din HPI above: HEENT: Denies ear pain and red eye. Denies hemoptysis.  Neuro: Denies headache.   Patient Active Problem List   Diagnosis Date Noted  . Tremor of right hand 09/27/2015  . Clumsiness 09/27/2015  . Dysesthesia affecting both sides of body 08/14/2015  . BMI 25.0-25.9,adult 07/03/2015  . DDD (degenerative disc disease), cervical 07/03/2015  . Fibroids, submucosal 12/07/2013  . Depression with anxiety 10/19/2013  . Cough 10/19/2013  . Mild obesity 09/07/2013  . Hyperlipidemia 10/04/2008  . VENTRICULAR TACHYCARDIA 10/04/2008  . RLQ PAIN 06/07/2007  . CARPAL TUNNEL SYNDROME, BILATERAL 05/06/2007  . MITRAL VALVE DISORDERS 05/06/2007  . ABNORMAL HEART RHYTHMS 05/06/2007  . GERD 05/06/2007  . HIATAL HERNIA 05/06/2007  . IBS 05/06/2007  . PLANTAR FASCIITIS 05/06/2007     Prior to Admission medications   Medication Sig Start Date End Date Taking? Authorizing Provider  ALPRAZolam [name]) 1 MG tablet TAKE 1 TABLET AT BEDTIME AS NEEDED FOR SLEEP. 12/25/15  Yes [name] Jeffery, PA-C  atorvastatin (LIPITOR) 20 MG tablet TAKE 1 TABLET BY MOUTH EVERY DAY 12/25/14   Yes [name] Jeffery, PA-C  cyclobenzaprine (FLEXERIL) 10 MG tablet Take 10 mg by mouth at bedtime as needed. 07/13/15  Yes Historical Provider, MD  diclofenac (VOLTAREN) 75 MG EC tablet Take 75 mg by mouth daily as needed.   Yes Historical Provider, MD  ipratropium (ATROVENT) 0.03 % nasal spray Place 2 sprays into both nostrils 2 (two) times daily. 08/28/15  Yes [name] Jeffery, PA-C  pantoprazole (PROTONIX) 40 MG tablet TAKE 1 TABLET (40 MG TOTAL) BY MOUTH 2 (TWO) TIMES DAILY. 11/01/15  Yes Irene Shipper, MD  sertraline (ZOLOFT) 50 MG tablet Take 1 tablet (50 mg total) by mouth daily. 12/25/15  Yes Harrison Mons, PA-C     Allergies  Allergen Reactions  . Covera-Hs [Verapamil]     Blood blisters, Stevens-Johnson syndrome  . Crestor [Rosuvastatin Calcium]     Flu like symptoms, on different statin  . Wellbutrin [Bupropion] Palpitations       Objective:  Physical Exam HEENT: Tenderness to palpation of frontal and maxillary sinuses. Throat is nonerythematous, no exudates. Ear canals are clear, TMs intact, nonerythematous, nonbulging. No lymphadenopathy or tenderness to palpation of neck. Pulm: Good respiratory effort. CTAB. No wheezes, rales, or rhonchi. CV: RRR. No M/R/G Abd: Soft. Nontender, nondistended.       Assessment & Plan:   1. Acute non-recurrent sinusitis, unspecified location Pt advised to drink plenty of water and take medications as directed. Pt advised to return if symptoms persist or if new or worrisome symptoms arise.  - amoxicillin-clavulanate (AUGMENTIN) 875-125 MG tablet; Take 1 tablet  by mouth 2 (two) times daily.  Dispense: 20 tablet; Refill: 0 - chlorpheniramine-HYDROcodone (TUSSIONEX PENNKINETIC ER) 10-8 MG/5ML SUER; Take 5 mLs by mouth every 12 (twelve) hours as needed for cough.  Dispense: 100 mL; Refill: 0 - benzonatate (TESSALON) 100 MG capsule; Take 1-2 capsules (100-200 mg total) by mouth 3 (three) times daily as needed for cough.  Dispense: 40 capsule; Refill:  0  2. VENTRICULAR TACHYCARDIA  Lorella Nimrod, PA-S

## 2016-01-30 ENCOUNTER — Other Ambulatory Visit: Payer: Self-pay | Admitting: Physician Assistant

## 2016-01-30 DIAGNOSIS — F418 Other specified anxiety disorders: Secondary | ICD-10-CM

## 2016-02-01 ENCOUNTER — Telehealth: Payer: Self-pay | Admitting: Emergency Medicine

## 2016-02-01 MED ORDER — ALPRAZOLAM 1 MG PO TABS: ORAL_TABLET | ORAL | 0 refills | Status: DC

## 2016-02-01 NOTE — Telephone Encounter (Signed)
Pt is aware medication Xanax called into pharmacy and ready for pickup

## 2016-02-01 NOTE — Telephone Encounter (Signed)
Meds ordered this encounter  Medications  . ALPRAZolam (XANAX) 1 MG tablet    Sig: TAKE 1 TABLET AT BEDTIME AS NEEDED FOR SLEEP.    Dispense:  30 tablet    Refill:  0    

## 2016-02-01 NOTE — Telephone Encounter (Signed)
Called to [facility]. 

## 2016-02-03 NOTE — Progress Notes (Signed)
Patient ID: [name], female    DOB: 15-Sep-1965, 51 y.o.   MRN: [id number]  PCP: Harrison Mons, PA-C  Chief Complaint  Patient presents with  . Nasal Congestion    aches, fatigue, fever 100.3 over weekend, cough, yellow/greenish plegm    Subjective:   Presents for evaluation of illness x 3 weeks.  Nasal congestion, cough and runny nose. Over the past 1 week, the cough has worsened and she has had several episodes of post-tussive emesis. 2 days ago she had fever (100.4), chills and body aches.  No SOB, CP, HA, dizziness. No GI/GU symptoms.   Review of Systems As above.    Patient Active Problem List   Diagnosis Date Noted  . Tremor of right hand 09/27/2015  . Clumsiness 09/27/2015  . Dysesthesia affecting both sides of body 08/14/2015  . BMI 25.0-25.9,adult 07/03/2015  . DDD (degenerative disc disease), cervical 07/03/2015  . Fibroids, submucosal 12/07/2013  . Depression with anxiety 10/19/2013  . Cough 10/19/2013  . Mild obesity 09/07/2013  . Hyperlipidemia 10/04/2008  . VENTRICULAR TACHYCARDIA 10/04/2008  . RLQ PAIN 06/07/2007  . CARPAL TUNNEL SYNDROME, BILATERAL 05/06/2007  . MITRAL VALVE DISORDERS 05/06/2007  . ABNORMAL HEART RHYTHMS 05/06/2007  . GERD 05/06/2007  . HIATAL HERNIA 05/06/2007  . IBS 05/06/2007  . PLANTAR FASCIITIS 05/06/2007     Prior to Admission medications   Medication Sig Start Date End Date Taking? Authorizing Provider  atorvastatin (LIPITOR) 20 MG tablet TAKE 1 TABLET BY MOUTH EVERY DAY 12/25/14  Yes Elzabeth Mcquerry, PA-C  cyclobenzaprine (FLEXERIL) 10 MG tablet Take 10 mg by mouth at bedtime as needed. 07/13/15  Yes Historical Provider, MD  diclofenac (VOLTAREN) 75 MG EC tablet Take 75 mg by mouth daily as needed.   Yes Historical Provider, MD  ipratropium (ATROVENT) 0.03 % nasal spray Place 2 sprays into both nostrils 2 (two) times daily. 08/28/15  Yes Javaun Dimperio, PA-C  pantoprazole (PROTONIX) 40 MG tablet TAKE 1  TABLET (40 MG TOTAL) BY MOUTH 2 (TWO) TIMES DAILY. 11/01/15  Yes Irene Shipper, MD  sertraline (ZOLOFT) 50 MG tablet Take 1 tablet (50 mg total) by mouth daily. 12/25/15  Yes Tawana Pasch, PA-C  ALPRAZolam (XANAX) 1 MG tablet TAKE 1 TABLET AT BEDTIME AS NEEDED FOR SLEEP. 02/01/16   Shannon Kirkendall, PA-C  amoxicillin-clavulanate (AUGMENTIN) 875-125 MG tablet Take 1 tablet by mouth 2 (two) times daily. 01/28/16   Maricruz Lucero, PA-C  benzonatate (TESSALON) 100 MG capsule Take 1-2 capsules (100-200 mg total) by mouth 3 (three) times daily as needed for cough. 01/28/16   Dondra Rhett, PA-C  chlorpheniramine-HYDROcodone (TUSSIONEX PENNKINETIC ER) 10-8 MG/5ML SUER Take 5 mLs by mouth every 12 (twelve) hours as needed for cough. 01/28/16   Harrison Mons, PA-C     Allergies  Allergen Reactions  . Covera-Hs [Verapamil]     Blood blisters, Stevens-Johnson syndrome  . Crestor [Rosuvastatin Calcium]     Flu like symptoms, on different statin  . Wellbutrin [Bupropion] Palpitations       Objective:  Physical Exam  Constitutional: She is oriented to person, place, and time. She appears well-developed and well-nourished. No distress.  BP 132/70 (BP Location: Right Arm, Patient Position: Sitting, Cuff Size: Small)   Pulse 97   Temp 97.9 F (36.6 C) (Oral)   Resp 16   Ht 5\' 5"  (1.651 m)   Wt 161 lb 6.4 oz (73.2 kg)   LMP 12/03/2014   SpO2 98%   BMI  26.86 kg/m    HENT:  Head: Normocephalic and atraumatic.  Right Ear: Hearing, tympanic membrane, external ear and ear canal normal.  Left Ear: Hearing, tympanic membrane, external ear and ear canal normal.  Nose: Mucosal edema and rhinorrhea present.  No foreign bodies. Right sinus exhibits maxillary sinus tenderness and frontal sinus tenderness. Left sinus exhibits maxillary sinus tenderness and frontal sinus tenderness.  Mouth/Throat: Uvula is midline, oropharynx is clear and moist and mucous membranes are normal. No uvula swelling. No oropharyngeal  exudate.  Eyes: Conjunctivae and EOM are normal. Pupils are equal, round, and reactive to light. Right eye exhibits no discharge. Left eye exhibits no discharge. No scleral icterus.  Neck: Trachea normal, normal range of motion and full passive range of motion without pain. Neck supple. No thyroid mass and no thyromegaly present.  Cardiovascular: Normal rate, regular rhythm and normal heart sounds.   Pulmonary/Chest: Effort normal and breath sounds normal.  Lymphadenopathy:       Head (right side): No submandibular, no tonsillar, no preauricular, no posterior auricular and no occipital adenopathy present.       Head (left side): No submandibular, no tonsillar, no preauricular and no occipital adenopathy present.    She has no cervical adenopathy.       Right: No supraclavicular adenopathy present.       Left: No supraclavicular adenopathy present.  Neurological: She is alert and oriented to person, place, and time. She has normal strength. No cranial nerve deficit or sensory deficit.  Skin: Skin is warm, dry and intact. No rash noted.  Psychiatric: She has a normal mood and affect. Her speech is normal and behavior is normal.           Assessment & Plan:   1. Acute non-recurrent sinusitis, unspecified location Supportive care.  Anticipatory guidance.  RTC if symptoms worsen/persist. - amoxicillin-clavulanate (AUGMENTIN) 875-125 MG tablet; Take 1 tablet by mouth 2 (two) times daily.  Dispense: 20 tablet; Refill: 0 - chlorpheniramine-HYDROcodone (TUSSIONEX PENNKINETIC ER) 10-8 MG/5ML SUER; Take 5 mLs by mouth every 12 (twelve) hours as needed for cough.  Dispense: 100 mL; Refill: 0 - benzonatate (TESSALON) 100 MG capsule; Take 1-2 capsules (100-200 mg total) by mouth 3 (three) times daily as needed for cough.  Dispense: 40 capsule; Refill: 0  2. VENTRICULAR TACHYCARDIA Stable. Controlled.   Fara Chute, PA-C Physician Assistant-Certified [facility] [place]

## 2016-02-08 ENCOUNTER — Other Ambulatory Visit: Payer: Self-pay | Admitting: Physician Assistant

## 2016-02-29 ENCOUNTER — Ambulatory Visit (INDEPENDENT_AMBULATORY_CARE_PROVIDER_SITE_OTHER): Payer: 59 | Admitting: Physician Assistant

## 2016-02-29 ENCOUNTER — Ambulatory Visit (INDEPENDENT_AMBULATORY_CARE_PROVIDER_SITE_OTHER): Payer: 59

## 2016-02-29 VITALS — BP 116/74 | HR 114 | Temp 102.7°F | Resp 16 | Ht 63.0 in | Wt 161.2 lb

## 2016-02-29 DIAGNOSIS — R5081 Fever presenting with conditions classified elsewhere: Secondary | ICD-10-CM

## 2016-02-29 DIAGNOSIS — R0989 Other specified symptoms and signs involving the circulatory and respiratory systems: Secondary | ICD-10-CM

## 2016-02-29 LAB — POCT CBC
GRANULOCYTE PERCENT: 87.1 % — AB (ref 37–80)
HEMATOCRIT: 35.8 % — AB (ref 37.7–47.9)
HEMOGLOBIN: 12.7 g/dL (ref 12.2–16.2)
Lymph, poc: 0.6 (ref 0.6–3.4)
MCH: 32 pg — AB (ref 27–31.2)
MCHC: 35.5 g/dL — AB (ref 31.8–35.4)
MCV: 90 fL (ref 80–97)
MID (cbc): 0.1 (ref 0–0.9)
MPV: 6.9 fL (ref 0–99.8)
POC GRANULOCYTE: 5.1 (ref 2–6.9)
POC LYMPH PERCENT: 10.8 %L (ref 10–50)
POC MID %: 2.1 % (ref 0–12)
Platelet Count, POC: 166 10*3/uL (ref 142–424)
RBC: 3.98 M/uL — AB (ref 4.04–5.48)
RDW, POC: 12.5 %
WBC: 5.8 10*3/uL (ref 4.6–10.2)

## 2016-02-29 MED ORDER — AZITHROMYCIN 250 MG PO TABS: ORAL_TABLET | ORAL | 0 refills | Status: AC

## 2016-02-29 MED ORDER — OSELTAMIVIR PHOSPHATE 75 MG PO CAPS
75.0000 mg | ORAL_CAPSULE | Freq: Two times a day (BID) | ORAL | 0 refills | Status: DC
Start: 2016-02-29 — End: 2016-04-14

## 2016-02-29 MED ORDER — ACETAMINOPHEN 325 MG PO TABS
1000.0000 mg | ORAL_TABLET | Freq: Once | ORAL | Status: AC
  Administered 2016-02-29: 975 mg via ORAL

## 2016-02-29 MED ORDER — HYDROCODONE-HOMATROPINE 5-1.5 MG/5ML PO SYRP: 2.5000 mL | ORAL_SOLUTION | Freq: Every day | ORAL | 0 refills | Status: AC

## 2016-02-29 NOTE — Progress Notes (Signed)
02/29/2016 6:36 PM   DOB: 1965/04/08 / MRN: [id number]  SUBJECTIVE:  [name] is a 51 y.o. female presenting for cough and fever.  She associates myalgia in the thighs,  SOB.  This started about 2 days ago.  Denies HA. No history of asthma, diabetes.  She has been taking 2 tylenol every 5-6 hours and also took some hycodan, mucinex and tessalon.   Immunization History  Administered Date(s) Administered  . Influenza,inj,Quad PF,36+ Mos 10/18/2012, 10/19/2013, 01/02/2015, 12/25/2015  . Tdap 12/07/2006   She is allergic to covera-hs [verapamil]; crestor [rosuvastatin calcium]; and wellbutrin [bupropion].   She  has a past medical history of Anxiety; Arrhythmia ventricular; Carpal tunnel syndrome, bilateral; Chronic RLQ pain; Dyslipidemia; Dysrhythmia; GERD (gastroesophageal reflux disease); Headache; Hiatal hernia; History of migraine headaches; IBS (irritable bowel syndrome); Insomnia; Menorrhagia; Mitral valve disorder; MVP (mitral valve prolapse); Plantar fasciitis; PONV (postoperative nausea and vomiting); Uterine fibroid; and Ventricular tachycardia ([place]).    She  reports that she has never smoked. She has never used smokeless tobacco. She reports that she drinks about 3.0 oz of alcohol per week . She reports that she does not use drugs. She  reports that she currently engages in sexual activity and has had female partners. She reports using the following method of birth control/protection: None. The patient  has a past surgical history that includes Patella arthroplasty; cspine (05/1999); echocardiogram (2000/2001); Spine surgery; Cardiac electrophysiology mapping and ablation (2002); Knee surgery (1998); Robotic assisted total hysterectomy (N/A, 12/07/2013); Bilateral salpingectomy (Bilateral, 12/07/2013); and Abdominal hysterectomy.  Her family history includes Colitis in her maternal grandmother; Headache in her sister; Heart disease (age of onset: 66) in her mother; Hiatal hernia in  her mother; Hypertension in her mother and sister; Irritable bowel syndrome in her mother; Lung cancer in her father; Pancreatic cancer in her paternal uncle; Seizures in her sister; Thyroid disease in her mother.  Review of Systems  Constitutional: Positive for fever.  Respiratory: Positive for cough, sputum production and shortness of breath. Negative for hemoptysis and wheezing.   Cardiovascular: Positive for chest pain (pleuritic). Negative for leg swelling.  Skin: Negative for rash.  Neurological: Negative for dizziness.    The problem list and medications were reviewed and updated by myself where necessary and exist elsewhere in the encounter.   OBJECTIVE:  BP 116/74 (BP Location: Right Arm, Patient Position: Sitting, Cuff Size: Normal)   Pulse (!) 114   Temp (!) 102.7 F (39.3 C) (Oral)   Resp 16   Ht 5\' 3"  (1.6 m)   Wt 161 lb 3.2 oz (73.1 kg)   LMP 12/03/2014   SpO2 98%   BMI 28.56 kg/m   Physical Exam  Constitutional:  Non-toxic appearance. She has a sickly appearance. No distress.  HENT:  Mouth/Throat: Oropharynx is clear and moist.  Cardiovascular: Normal rate, regular rhythm and normal heart sounds.   Pulmonary/Chest: Effort normal. No respiratory distress. She has no wheezes. She has rales. She exhibits no tenderness.  Musculoskeletal: She exhibits no edema or tenderness.  Skin: She is not diaphoretic.    Results for orders placed or performed in visit on 02/29/16 (from the past 72 hour(s))  POCT CBC     Status: Abnormal   Collection Time: 02/29/16  6:33 PM  Result Value Ref Range   WBC 5.8 4.6 - 10.2 K/uL   Lymph, poc 0.6 0.6 - 3.4   POC LYMPH PERCENT 10.8 10 - 50 %L   MID (cbc) 0.1 0 - 0.9  POC MID % 2.1 0 - 12 %M   POC Granulocyte 5.1 2 - 6.9   Granulocyte percent 87.1 (A) 37 - 80 %G   RBC 3.98 (A) 4.04 - 5.48 M/uL   Hemoglobin 12.7 12.2 - 16.2 g/dL   HCT, POC 35.8 (A) 37.7 - 47.9 %   MCV 90.0 80 - 97 fL   MCH, POC 32.0 (A) 27 - 31.2 pg   MCHC 35.5  (A) 31.8 - 35.4 g/dL   RDW, POC 12.5 %   Platelet Count, POC 166 142 - 424 K/uL   MPV 6.9 0 - 99.8 fL    Dg Chest 2 View  Result Date: 02/29/2016 CLINICAL DATA:  Acute onset of cough and fever. Left lower lung zone rales. Initial encounter. EXAM: CHEST  2 VIEW COMPARISON:  Chest radiographs from 06/14/2011 FINDINGS: The lungs are well-aerated and clear. There is no evidence of focal opacification, pleural effusion or pneumothorax. The heart is normal in size; the mediastinal contour is within normal limits. No acute osseous abnormalities are seen. IMPRESSION: No acute cardiopulmonary process seen. Electronically Signed   By: Garald Balding M.D.   On: 02/29/2016 18:30    ASSESSMENT AND PLAN:  [name] was seen today for chest congestion and generalized body aches.  Diagnoses and all orders for this visit:  Chest rales: White count and rads reassuring.  Will start azithromycin and tamiflu. Fever down to 100 and pulse down to 105 with 1000 of tylenol.   She will come back in 48 hours if not improving. -     POCT CBC -     DG Chest 2 View; Future -     Basic metabolic panel  Fever in other diseases -     acetaminophen (TYLENOL) tablet 975 mg; Take 3 tablets (975 mg total) by mouth once.    The patient is advised to call or return to clinic if she does not see an improvement in symptoms, or to seek the care of the closest emergency department if she worsens with the above plan.   Philis Fendt, MHS, PA-C [facility] 02/29/2016 6:36 PM

## 2016-02-29 NOTE — Patient Instructions (Signed)
     IF you received an x-ray today, you will receive an invoice from [facility]. Please contact [facility] at [phone] with questions or concerns regarding your invoice.   IF you received labwork today, you will receive an invoice from [facility]. Please contact [facility] at [phone] with questions or concerns regarding your invoice.   Our billing staff will not be able to assist you with questions regarding bills from these companies.  You will be contacted with the lab results as soon as they are available. The fastest way to get your results is to activate your My Chart account. Instructions are located on the last page of this paperwork. If you have not heard from us regarding the results in 2 weeks, please contact this office.     

## 2016-03-01 LAB — BASIC METABOLIC PANEL
BUN / CREAT RATIO: 14 (ref 9–23)
BUN: 10 mg/dL (ref 6–24)
CO2: 26 mmol/L (ref 18–29)
CREATININE: 0.74 mg/dL (ref 0.57–1.00)
Calcium: 8.6 mg/dL — ABNORMAL LOW (ref 8.7–10.2)
Chloride: 97 mmol/L (ref 96–106)
GFR calc Af Amer: 108 mL/min/{1.73_m2} (ref >59)
GFR, EST NON AFRICAN AMERICAN: 93 mL/min/{1.73_m2} (ref >59)
GLUCOSE: 105 mg/dL — AB (ref 65–99)
Potassium: 4.1 mmol/L (ref 3.5–5.2)
Sodium: 137 mmol/L (ref 134–144)

## 2016-03-03 ENCOUNTER — Telehealth: Payer: Self-pay

## 2016-03-03 NOTE — Telephone Encounter (Signed)
Please advise 

## 2016-03-03 NOTE — Telephone Encounter (Signed)
Pt is returning labs call   Best number [phone]

## 2016-03-05 ENCOUNTER — Encounter: Payer: Self-pay | Admitting: Physician Assistant

## 2016-03-27 ENCOUNTER — Other Ambulatory Visit: Payer: Self-pay | Admitting: Physician Assistant

## 2016-03-27 DIAGNOSIS — F418 Other specified anxiety disorders: Secondary | ICD-10-CM

## 2016-03-28 MED ORDER — ALPRAZOLAM 1 MG PO TABS: ORAL_TABLET | ORAL | 0 refills | Status: DC

## 2016-03-28 NOTE — Telephone Encounter (Signed)
Meds ordered this encounter  Medications  . ALPRAZolam (XANAX) 1 MG tablet    Sig: TAKE 1 TABLET AT BEDTIME AS NEEDED FOR SLEEP.    Dispense:  30 tablet    Refill:  0

## 2016-03-28 NOTE — Telephone Encounter (Signed)
Called to [facility]. 

## 2016-04-08 ENCOUNTER — Other Ambulatory Visit: Payer: Self-pay | Admitting: Obstetrics and Gynecology

## 2016-04-08 DIAGNOSIS — Z1231 Encounter for screening mammogram for malignant neoplasm of breast: Secondary | ICD-10-CM

## 2016-04-14 ENCOUNTER — Ambulatory Visit (INDEPENDENT_AMBULATORY_CARE_PROVIDER_SITE_OTHER): Payer: 59 | Admitting: Physician Assistant

## 2016-04-14 VITALS — BP 129/82 | HR 89 | Temp 98.0°F | Resp 18 | Ht 63.0 in | Wt 157.0 lb

## 2016-04-14 DIAGNOSIS — R05 Cough: Secondary | ICD-10-CM

## 2016-04-14 DIAGNOSIS — R059 Cough, unspecified: Secondary | ICD-10-CM

## 2016-04-14 MED ORDER — HYDROCOD POLST-CPM POLST ER 10-8 MG/5ML PO SUER: 5.0000 mL | Freq: Two times a day (BID) | ORAL | 0 refills | Status: DC | PRN

## 2016-04-14 MED ORDER — PREDNISONE 20 MG PO TABS: ORAL_TABLET | ORAL | 0 refills | Status: DC

## 2016-04-14 NOTE — Patient Instructions (Addendum)
Get plenty of rest and drink at least 64 ounces of water daily.  DELEGATE ALL THE HOUSEHOLD RESPONSIBILITIES TO YOUR FAMILY: meal planning, shopping, cooking, cleaning, pet care, packing lunches, yard work, etc   IF you received an x-ray today, you will receive an invoice from [facility]. Please contact [facility] at [phone] with questions or concerns regarding your invoice.   IF you received labwork today, you will receive an invoice from [facility]. Please contact [facility] at [phone] with questions or concerns regarding your invoice.   Our billing staff will not be able to assist you with questions regarding bills from these companies.  You will be contacted with the lab results as soon as they are available. The fastest way to get your results is to activate your My Chart account. Instructions are located on the last page of this paperwork. If you have not heard from [place] regarding the results in 2 weeks, please contact this office.

## 2016-04-14 NOTE — Progress Notes (Signed)
Patient ID: [name], female    DOB: [birth year], 51 y.o.   MRN: [id number]  PCP: Harrison Mons, PA-C  Chief Complaint  Patient presents with  . Cough    productive (green sputum)  . Sore Throat  . Chest Pain    congestion     Subjective:   Presents for evaluation of cough, sore throat and congestion intermittently since 12/2015, worsening x 6 days.  I saw her 01/28/2016 with 3 weeks of these symptoms and fever of 100.3, chills and body aches. She was diagnosed with sinusitis and possible overlapping influenza, and treated with Augmentin, Tussionex and Tessalon perles.  The illness resolved, and she presented again on 02/29/2016 with 2 days of symptoms. She was noted to have rales on exam, CBC was normal and CXR was negative for acute cardiopulmonary process. She was treated with azithromycin and Tamiflu.  The symptoms improved, but did not completely resolve. 1 week ago she developed laryngitis that lasted 2 days. Sore throat persists. Cough is productive of green sputum. Fevers intermittently (subjective). Fatigued. Cough is beginning to disrupt her work. She has coughing spells that last so long she has to leave meetings.   No sneezing, nasal congestion, runny nose.  She had a GI illness last week (as did her family), but those symptoms have resolved entirely.  Tessalon perles have been ineffective. OTC cough suppressant and Mucinex have not been helpful.   Review of Systems Constitutional: Negative for activity change, appetite change, and unexpected weight change.  HENT: Negative for congestion, dental problem, ear pain, hearing loss, mouth sores, rhinorrhea, sneezing, tinnitusand trouble swallowing.  Eyes: Negative for photophobia, pain, rednessand visual disturbance.  Respiratory: Negative for shortness of breath.  Cardiovascular: Negative for chest pain, palpitationsand leg swelling.  Gastrointestinal: Negative for abdominal pain, blood in  stool, constipation, diarrhea, nauseaand vomiting.  Genitourinary: Negative for dysuria, frequency, hematuriaand urgency.  Musculoskeletal: Negative for gait problem, myalgiasand neck stiffness.  Skin: Negative for rash.  Neurological: Negative for dizziness, speech difficulty, weakness, light-headedness, numbnessand headaches.  Hematological: Negative for adenopathy.  Psychiatric/Behavioral: Negative for confusionand sleep disturbance. The patient is not nervous/anxious.     Patient Active Problem List   Diagnosis Date Noted  . Tremor of right hand 09/27/2015  . Clumsiness 09/27/2015  . Dysesthesia affecting both sides of body 08/14/2015  . BMI 25.0-25.9,adult 07/03/2015  . DDD (degenerative disc disease), cervical 07/03/2015  . Fibroids, submucosal 12/07/2013  . Depression with anxiety 10/19/2013  . Cough 10/19/2013  . Mild obesity 09/07/2013  . Hyperlipidemia 10/04/2008  . VENTRICULAR TACHYCARDIA 10/04/2008  . RLQ PAIN 06/07/2007  . CARPAL TUNNEL SYNDROME, BILATERAL 05/06/2007  . MITRAL VALVE DISORDERS 05/06/2007  . ABNORMAL HEART RHYTHMS 05/06/2007  . GERD 05/06/2007  . HIATAL HERNIA 05/06/2007  . IBS 05/06/2007  . PLANTAR FASCIITIS 05/06/2007     Prior to Admission medications   Medication Sig Start Date End Date Taking? Authorizing Provider  ALPRAZolam [name]) 1 MG tablet TAKE 1 TABLET AT BEDTIME AS NEEDED FOR SLEEP. 03/28/16  Yes Miah Boye, PA-C  atorvastatin (LIPITOR) 20 MG tablet TAKE 1 TABLET BY MOUTH EVERY DAY 02/10/16  Yes Kamyla Olejnik, PA-C  benzonatate (TESSALON) 100 MG capsule Take 1-2 capsules (100-200 mg total) by mouth 3 (three) times daily as needed for cough. 01/28/16  Yes Shylo Zamor, PA-C  cefdinir (OMNICEF) 300 MG capsule Take 300 mg by mouth 2 (two) times daily.   Yes Historical Provider, MD  chlorpheniramine-HYDROcodone Amanda Cockayne [facility] ER) 10-8  MG/5ML SUER Take 5 mLs by mouth every 12 (twelve) hours as needed for cough. 01/28/16  Yes  Hatem Cull, PA-C  cyclobenzaprine (FLEXERIL) 10 MG tablet Take 10 mg by mouth at bedtime as needed. 07/13/15  Yes Historical Provider, MD  diclofenac (VOLTAREN) 75 MG EC tablet Take 75 mg by mouth daily as needed.   Yes Historical Provider, MD  ipratropium (ATROVENT) 0.03 % nasal spray Place 2 sprays into both nostrils 2 (two) times daily. 08/28/15  Yes Ellen Goris, PA-C  pantoprazole (PROTONIX) 40 MG tablet TAKE 1 TABLET (40 MG TOTAL) BY MOUTH 2 (TWO) TIMES DAILY. 11/01/15  Yes Irene Shipper, MD  sertraline (ZOLOFT) 50 MG tablet Take 1 tablet (50 mg total) by mouth daily. 12/25/15  Yes Chisum Habenicht, PA-C  oseltamivir (TAMIFLU) 75 MG capsule Take 1 capsule (75 mg total) by mouth 2 (two) times daily. Patient not taking: Reported on 04/14/2016 02/29/16   Tereasa Coop, PA-C     Allergies  Allergen Reactions  . Covera-Hs [Verapamil]     Blood blisters, Stevens-Johnson syndrome  . Crestor [Rosuvastatin Calcium]     Flu like symptoms, on different statin  . Wellbutrin [Bupropion] Palpitations       Objective:  Physical Exam  Constitutional: She is oriented to person, place, and time. She appears well-developed and well-nourished. She is active and cooperative. No distress.  BP 129/82   Pulse 89   Temp 98 F (36.7 C) (Oral)   Resp 18   Ht 5\' 3"  (1.6 m)   Wt 157 lb (71.2 kg)   LMP 12/03/2014   SpO2 97%   BMI 27.81 kg/m   HENT:  Head: Normocephalic and atraumatic.  Right Ear: Hearing, tympanic membrane, external ear and ear canal normal.  Left Ear: Tympanic membrane, external ear and ear canal normal. No decreased hearing is noted.  Nose: Mucosal edema (mild) present. Right sinus exhibits no maxillary sinus tenderness and no frontal sinus tenderness. Left sinus exhibits no maxillary sinus tenderness and no frontal sinus tenderness.  Mouth/Throat: Uvula is midline, oropharynx is clear and moist and mucous membranes are normal. No oral lesions. Normal dentition.  Eyes: Conjunctivae  are normal. No scleral icterus.  Neck: Normal range of motion. Neck supple. No thyromegaly present.  Cardiovascular: Normal rate, regular rhythm and normal heart sounds.   Pulses:      Radial pulses are 2+ on the right side, and 2+ on the left side.  Pulmonary/Chest: Effort normal and breath sounds normal.  Lymphadenopathy:       Head (right side): No tonsillar, no preauricular, no posterior auricular and no occipital adenopathy present.       Head (left side): No tonsillar, no preauricular, no posterior auricular and no occipital adenopathy present.    She has no cervical adenopathy.       Right: No supraclavicular adenopathy present.       Left: No supraclavicular adenopathy present.  Neurological: She is alert and oriented to person, place, and time. No sensory deficit.  Skin: Skin is warm, dry and intact. No rash noted. No cyanosis or erythema. Nails show no clubbing.  Psychiatric: She has a normal mood and affect. Her speech is normal and behavior is normal.           Assessment & Plan:   Problem List Items Addressed This Visit    Cough - Primary    History of cough thought likely due to LPR, though cyclical cough considered possible. She improved with PPI  therapy. I believe that the present cough is due to mild rhinitis resulting in post-nasal drainage, and is prolonged due to her tendency toward cough due to the other condition(s). Resume hydrocodone-containing cough suppressant and course of oral steroids.      Relevant Medications   chlorpheniramine-HYDROcodone (TUSSIONEX PENNKINETIC ER) 10-8 MG/5ML SUER   predniSONE (DELTASONE) 20 MG tablet       Return if symptoms worsen or fail to improve.   Fara Chute, PA-C [facility]

## 2016-04-14 NOTE — Progress Notes (Signed)
THIS NOTE IS USED FOR EDUCATIONAL PURPOSES ONLY!!!   Name: [name]  DOB: 04/03/1965  Age: 51 y.o. Sex: female  CC:  Chief Complaint  Patient presents with  . Cough    productive (green sputum)  . Sore Throat  . Chest Pain    congestion     PCP: Harrison Mons, PA-C  HPI: Patient reports today with an acute on chronic complaint of respiratory infection since Christmas of 2017. Patient reports today a complaint of cough, chest tightness, and sore throat that recently flared up x6 days ago.  Patient reports she came into the office in January for complaints of similar symptoms where she was given Augmentin, Tuxxionex, and tessalon pearls and was diagnosed with acute sinusitis. She reports her symptoms improved but never completely resolved. She then came in February where she had a CXR which showed no abnormalities and was diagnosed with possible flu and PNA and was started on Tamiflu and Azithromycin. Patient reports her symptoms improved but never resolved. Patient then had a virtual consult on Friday (04/12/16) and was prescribed Cefdinir for respiratory infection. Patient has been taking medication x3 days with continued, worsened symptoms.   Patient reports she has had a sore throat, and what she thought was laryngitis 1 week ago for two days because she lost her voice. Patient reports her cough is starting to affect her work because she is having to leave meeting due to coughing spells. Patient report the cough is productive and is coughing up green sputum. Patient repots taking OTC cough syrup, 12 hour mucinex. Patient reports the Tessalon pearls do not help her cough at all. Patient denies any congestion, sneezing, or rhinorrhea. She reports fatigue and intermittent fevers. She reports her family had the stomach bug last week and all were sick including her. She denies N/D/C today. She reports vomiting with her first dose of cefdinir due to taking the medication on an empty  stomach but has not had any vomiting since taking the medication with food. Patient reports receiving the flu vaccine this year.   ROS:  Constitutional: Negative for activity change, appetite change, and unexpected weight change.  HENT: Negative for congestion, dental problem, ear pain, hearing loss, mouth sores, rhinorrhea, sneezing, tinnitus and trouble swallowing.   Eyes: Negative for photophobia, pain, redness and visual disturbance.  Respiratory: Negative for shortness of breath.   Cardiovascular: Negative for chest pain, palpitations and leg swelling.  Gastrointestinal: Negative for abdominal pain, blood in stool, constipation, diarrhea, nausea and vomiting.  Genitourinary: Negative for dysuria, frequency, hematuria and urgency.  Musculoskeletal: Negative for gait problem, myalgias and neck stiffness.  Skin: Negative for rash.  Neurological: Negative for dizziness, speech difficulty, weakness, light-headedness, numbness and headaches.  Hematological: Negative for adenopathy.  Psychiatric/Behavioral: Negative for confusion and sleep disturbance. The patient is not nervous/anxious.    PMH:  Patient Active Problem List   Diagnosis Date Noted  . Tremor of right hand 09/27/2015  . Clumsiness 09/27/2015  . Dysesthesia affecting both sides of body 08/14/2015  . BMI 25.0-25.9,adult 07/03/2015  . DDD (degenerative disc disease), cervical 07/03/2015  . Fibroids, submucosal 12/07/2013  . Depression with anxiety 10/19/2013  . Cough 10/19/2013  . Mild obesity 09/07/2013  . Hyperlipidemia 10/04/2008  . VENTRICULAR TACHYCARDIA 10/04/2008  . RLQ PAIN 06/07/2007  . CARPAL TUNNEL SYNDROME, BILATERAL 05/06/2007  . MITRAL VALVE DISORDERS 05/06/2007  . ABNORMAL HEART RHYTHMS 05/06/2007  . GERD 05/06/2007  . HIATAL HERNIA 05/06/2007  . IBS 05/06/2007  .  PLANTAR FASCIITIS 05/06/2007    Allergies:  Allergies  Allergen Reactions  . Covera-Hs [Verapamil]     Blood blisters, Stevens-Johnson  syndrome  . Crestor [Rosuvastatin Calcium]     Flu like symptoms, on different statin  . Wellbutrin [Bupropion] Palpitations    Medications:  Current Outpatient Prescriptions on File Prior to Visit  Medication Sig Dispense Refill  . ALPRAZolam (XANAX) 1 MG tablet TAKE 1 TABLET AT BEDTIME AS NEEDED FOR SLEEP. 30 tablet 0  . atorvastatin (LIPITOR) 20 MG tablet TAKE 1 TABLET BY MOUTH EVERY DAY 90 tablet 0  . benzonatate (TESSALON) 100 MG capsule Take 1-2 capsules (100-200 mg total) by mouth 3 (three) times daily as needed for cough. 40 capsule 0  . chlorpheniramine-HYDROcodone (TUSSIONEX PENNKINETIC ER) 10-8 MG/5ML SUER Take 5 mLs by mouth every 12 (twelve) hours as needed for cough. 100 mL 0  . cyclobenzaprine (FLEXERIL) 10 MG tablet Take 10 mg by mouth at bedtime as needed.  3  . diclofenac (VOLTAREN) 75 MG EC tablet Take 75 mg by mouth daily as needed.    Marland Kitchen ipratropium (ATROVENT) 0.03 % nasal spray Place 2 sprays into both nostrils 2 (two) times daily. 30 mL 12  . pantoprazole (PROTONIX) 40 MG tablet TAKE 1 TABLET (40 MG TOTAL) BY MOUTH 2 (TWO) TIMES DAILY. 180 tablet 1  . sertraline (ZOLOFT) 50 MG tablet Take 1 tablet (50 mg total) by mouth daily. 30 tablet 3  . oseltamivir (TAMIFLU) 75 MG capsule Take 1 capsule (75 mg total) by mouth 2 (two) times daily. (Patient not taking: Reported on 04/14/2016) 10 capsule 0   Current Facility-Administered Medications on File Prior to Visit  Medication Dose Route Frequency Provider Last Rate Last Dose  . 0.9 %  sodium chloride infusion  500 mL Intravenous Continuous Irene Shipper, MD        PE:  GS: WDWN female sitting on exam table in NAD.  Vitals: BP 129/82   Pulse 89   Temp 98 F (36.7 C) (Oral)   Resp 18   Ht 5\' 3"  (1.6 m)   Wt 157 lb (71.2 kg)   LMP 12/03/2014   SpO2 97%   BMI 27.81 kg/m  HEENT: Normocephalic, atruamatic. PEARRL. No cervical lymphadenopathy. No thyroid nodules, normal size, and equal bilaterally. Ears: Bilateral ears  without erythema, TM clear with good coen of light. TM without bulging. Nose: Patent, with mild bogginess. Mouth/Throat: Moist mucous membranes. No erythema. Tonsils without erythema or tonsillar exudate.  Cardiovascular: RRR. No S3 or S4. No murmurs, rubs, or gallops. Pulses 2+ and equal bilateral in the upper and lower extremities. No pitting edema. No varicosities, clubbing, or cyanosis.  Pulm: CTA bilaterally. No expiratory muscle use while breathing. Patient sounds congestion when talking.  GI: +BS. NTND. No rigidity or guarding. No rebound tenderness.  Neuro: CN 2-12 grossly intact.  Psych: A&O x 4. Mood and affect appropriate for situation.  Skin: Warm and dry. No rashes or excoriations on exposed skin.   A&P:  1. Cough - Most likely due to congestion in her sinuses which has associated postnasal drip which is causing the an inflammatory process in the lungs causing an associated sore throat and cough.  Plan:  -Pharm: chlorpheniramine-HYDROcodone (TUSSIONEX PENNKINETIC ER) 10-8 MG/5ML SUER, predniSONE (DELTASONE) 20 MG tablet -Education: Rest. Hydrate- 64 oz of water daily.       Respectfully,  Delilah Shan, PA-S2

## 2016-04-18 ENCOUNTER — Other Ambulatory Visit: Payer: Self-pay | Admitting: Physician Assistant

## 2016-04-18 DIAGNOSIS — R454 Irritability and anger: Secondary | ICD-10-CM

## 2016-04-20 ENCOUNTER — Encounter: Payer: Self-pay | Admitting: Physician Assistant

## 2016-04-22 ENCOUNTER — Other Ambulatory Visit: Payer: Self-pay | Admitting: Physician Assistant

## 2016-04-22 DIAGNOSIS — F418 Other specified anxiety disorders: Secondary | ICD-10-CM

## 2016-04-23 MED ORDER — MELOXICAM 15 MG PO TABS
15.0000 mg | ORAL_TABLET | Freq: Every day | ORAL | 1 refills | Status: DC
Start: 2016-04-23 — End: 2016-07-08

## 2016-04-25 MED ORDER — ALPRAZOLAM 1 MG PO TABS: ORAL_TABLET | ORAL | 0 refills | Status: DC

## 2016-04-25 NOTE — Telephone Encounter (Signed)
Called to [facility]. 

## 2016-04-25 NOTE — Telephone Encounter (Signed)
Meds ordered this encounter  Medications  . ALPRAZolam (XANAX) 1 MG tablet    Sig: TAKE 1 TABLET AT BEDTIME AS NEEDED FOR SLEEP.    Dispense:  30 tablet    Refill:  0

## 2016-04-27 NOTE — Assessment & Plan Note (Signed)
History of cough thought likely due to LPR, though cyclical cough considered possible. She improved with PPI therapy. I believe that the present cough is due to mild rhinitis resulting in post-nasal drainage, and is prolonged due to her tendency toward cough due to the other condition(s). Resume hydrocodone-containing cough suppressant and course of oral steroids.

## 2016-05-13 ENCOUNTER — Ambulatory Visit
Admission: RE | Admit: 2016-05-13 | Discharge: 2016-05-13 | Disposition: A | Discharge status: Home / Self Care | Payer: 59 | Source: Ambulatory Visit | Attending: Obstetrics and Gynecology | Admitting: Obstetrics and Gynecology

## 2016-05-13 DIAGNOSIS — Z1231 Encounter for screening mammogram for malignant neoplasm of breast: Secondary | ICD-10-CM

## 2016-05-14 ENCOUNTER — Other Ambulatory Visit: Payer: Self-pay | Admitting: Physician Assistant

## 2016-05-23 ENCOUNTER — Other Ambulatory Visit: Payer: Self-pay | Admitting: Physician Assistant

## 2016-05-23 DIAGNOSIS — F418 Other specified anxiety disorders: Secondary | ICD-10-CM

## 2016-05-24 NOTE — Telephone Encounter (Signed)
04/25/2016 last refill

## 2016-05-26 MED ORDER — ALPRAZOLAM 1 MG PO TABS: ORAL_TABLET | ORAL | 0 refills | Status: DC

## 2016-05-26 NOTE — Telephone Encounter (Signed)
Called to [facility]. 

## 2016-05-26 NOTE — Telephone Encounter (Signed)
Meds ordered this encounter  Medications  . ALPRAZolam (XANAX) 1 MG tablet    Sig: TAKE 1 TABLET AT BEDTIME AS NEEDED FOR SLEEP.    Dispense:  30 tablet    Refill:  0

## 2016-06-11 ENCOUNTER — Other Ambulatory Visit: Payer: Self-pay | Admitting: Physician Assistant

## 2016-06-11 DIAGNOSIS — R454 Irritability and anger: Secondary | ICD-10-CM

## 2016-06-25 ENCOUNTER — Other Ambulatory Visit: Payer: Self-pay | Admitting: Physician Assistant

## 2016-06-25 DIAGNOSIS — F418 Other specified anxiety disorders: Secondary | ICD-10-CM

## 2016-06-25 MED ORDER — ALPRAZOLAM 1 MG PO TABS: ORAL_TABLET | ORAL | 0 refills | Status: DC

## 2016-06-25 NOTE — Telephone Encounter (Addendum)
Meds ordered this encounter  Medications  . ALPRAZolam (XANAX) 1 MG tablet    Sig: TAKE 1 TABLET AT BEDTIME AS NEEDED FOR SLEEP.    Dispense:  30 tablet    Refill:  0   Patient notified via My Chart.

## 2016-06-26 ENCOUNTER — Other Ambulatory Visit: Payer: Self-pay | Admitting: Physician Assistant

## 2016-06-26 DIAGNOSIS — F418 Other specified anxiety disorders: Secondary | ICD-10-CM

## 2016-06-26 NOTE — Telephone Encounter (Signed)
I Faxed Alprazolam rx to [address]

## 2016-06-26 NOTE — Telephone Encounter (Signed)
Meds ordered this encounter  Medications  . ALPRAZolam (XANAX) 1 MG tablet    Sig: TAKE 1 TABLET BY MOUTH AT BEDTIME AS NEEDED FOR SLEEP    Dispense:  30 tablet    Refill:  0    Not to exceed 5 additional fills before 11/22/2016

## 2016-06-26 NOTE — Telephone Encounter (Signed)
Script faxed to pharmacy

## 2016-07-08 ENCOUNTER — Encounter: Payer: Self-pay | Admitting: Physician Assistant

## 2016-07-08 ENCOUNTER — Ambulatory Visit (INDEPENDENT_AMBULATORY_CARE_PROVIDER_SITE_OTHER): Payer: 59 | Admitting: Physician Assistant

## 2016-07-08 VITALS — BP 116/80 | HR 78 | Temp 97.7°F | Resp 18 | Ht 63.0 in | Wt 169.2 lb

## 2016-07-08 DIAGNOSIS — I472 Ventricular tachycardia, unspecified: Secondary | ICD-10-CM

## 2016-07-08 DIAGNOSIS — R5382 Chronic fatigue, unspecified: Secondary | ICD-10-CM

## 2016-07-08 DIAGNOSIS — E785 Hyperlipidemia, unspecified: Secondary | ICD-10-CM

## 2016-07-08 DIAGNOSIS — Z6829 Body mass index (BMI) 29.0-29.9, adult: Secondary | ICD-10-CM | POA: Diagnosis not present

## 2016-07-08 DIAGNOSIS — R454 Irritability and anger: Secondary | ICD-10-CM | POA: Diagnosis not present

## 2016-07-08 DIAGNOSIS — F418 Other specified anxiety disorders: Secondary | ICD-10-CM

## 2016-07-08 DIAGNOSIS — R739 Hyperglycemia, unspecified: Secondary | ICD-10-CM

## 2016-07-08 MED ORDER — DULOXETINE HCL 30 MG PO CPEP: 30.0000 mg | ORAL_CAPSULE | Freq: Every day | ORAL | 0 refills | Status: DC

## 2016-07-08 NOTE — Progress Notes (Signed)
Patient ID: [name], female    DOB: 06/14/1965, 51 y.o.   MRN: [id number]  PCP: Harrison Mons, PA-C  Chief Complaint  Patient presents with  . Hyperlipidemia    6 month follow, last visit was 12/25/2015  . Follow-up    Subjective:   Presents for evaluation of hyperlipidemia, hyperglycemia, depression.  At her last visit, she was started on sertraline for mood. It has helped a lot, but she describes increased fatigue and weight gain. Her fatigue is chronic, and she has seen neurology, but hasn't yet scheduled the recommended sleep study.  Tolerating her other medications well. No adverse effects. No CP, SOB, HA, dizziness, nausea, vomiting, diarrhea, constipation.  She is celebrating her wedding anniversary today-a spa treatment and overnight at the [facility].    Review of Systems As above.    Patient Active Problem List   Diagnosis Date Noted  . Tremor of right hand 09/27/2015  . Clumsiness 09/27/2015  . Dysesthesia affecting both sides of body 08/14/2015  . BMI 25.0-25.9,adult 07/03/2015  . DDD (degenerative disc disease), cervical 07/03/2015  . Fibroids, submucosal 12/07/2013  . Depression with anxiety 10/19/2013  . Cough 10/19/2013  . Mild obesity 09/07/2013  . Hyperlipidemia 10/04/2008  . VENTRICULAR TACHYCARDIA 10/04/2008  . RLQ PAIN 06/07/2007  . CARPAL TUNNEL SYNDROME, BILATERAL 05/06/2007  . MITRAL VALVE DISORDERS 05/06/2007  . ABNORMAL HEART RHYTHMS 05/06/2007  . GERD 05/06/2007  . HIATAL HERNIA 05/06/2007  . IBS 05/06/2007  . PLANTAR FASCIITIS 05/06/2007     Prior to Admission medications   Medication Sig Start Date End Date Taking? Authorizing Provider  ALPRAZolam [name]) 1 MG tablet TAKE 1 TABLET BY MOUTH AT BEDTIME AS NEEDED FOR SLEEP 06/26/16  Yes Waleska Buttery, PA-C  atorvastatin (LIPITOR) 20 MG tablet TAKE 1 TABLET BY MOUTH EVERY DAY 05/14/16  Yes Katricia Prehn, PA-C  cyclobenzaprine (FLEXERIL) 10 MG tablet Take  10 mg by mouth at bedtime as needed. 07/13/15  Yes [provider]  diclofenac (VOLTAREN) 75 MG EC tablet Take 75 mg by mouth daily as needed.   Yes [provider]  ipratropium (ATROVENT) 0.03 % nasal spray Place 2 sprays into both nostrils 2 (two) times daily. 08/28/15  Yes Blyss Lugar, PA-C  pantoprazole (PROTONIX) 40 MG tablet TAKE 1 TABLET (40 MG TOTAL) BY MOUTH 2 (TWO) TIMES DAILY. 11/01/15  Yes Irene Shipper, MD  sertraline (ZOLOFT) 50 MG tablet TAKE 1 TABLET BY MOUTH DAILY 06/12/16  Yes Mikeria Valin, PA-C  cefdinir (OMNICEF) 300 MG capsule Take 300 mg by mouth 2 (two) times daily.    [provider]     Allergies  Allergen Reactions  . Covera-Hs [Verapamil]     Blood blisters, Stevens-Johnson syndrome  . Crestor [Rosuvastatin Calcium]     Flu like symptoms, on different statin  . Wellbutrin [Bupropion] Palpitations       Objective:  Physical Exam  Constitutional: She is oriented to person, place, and time. She appears well-developed and well-nourished. She is active and cooperative. No distress.  BP 116/80 (BP Location: Right Arm, Patient Position: Sitting, Cuff Size: Normal)   Pulse 78   Temp 97.7 F (36.5 C) (Oral)   Resp 18   Ht 5\' 3"  (1.6 m)   Wt 169 lb 3.2 oz (76.7 kg)   LMP 12/03/2014   SpO2 96%   BMI 29.97 kg/m   HENT:  Head: Normocephalic and atraumatic.  Right Ear: Hearing normal.  Left Ear: Hearing normal.  Eyes: Conjunctivae are normal. No scleral icterus.  Neck: Normal range of motion. Neck supple. No thyromegaly present.  Cardiovascular: Normal rate, regular rhythm and normal heart sounds.   Pulses:      Radial pulses are 2+ on the right side, and 2+ on the left side.  Pulmonary/Chest: Effort normal and breath sounds normal.  Lymphadenopathy:       Head (right side): No tonsillar, no preauricular, no posterior auricular and no occipital adenopathy present.       Head (left side): No tonsillar, no preauricular, no posterior  auricular and no occipital adenopathy present.    She has no cervical adenopathy.       Right: No supraclavicular adenopathy present.       Left: No supraclavicular adenopathy present.  Neurological: She is alert and oriented to person, place, and time. No sensory deficit.  Skin: Skin is warm, dry and intact. No rash noted. No cyanosis or erythema. Nails show no clubbing.  Psychiatric: She has a normal mood and affect. Her speech is normal and behavior is normal.           Assessment & Plan:   Problem List Items Addressed This Visit    Chronic fatigue (Chronic)    Proceed with sleep study, per neurology recommendations. SSRI may contribute, so reduce to 25 mg for 1-2 weeks, then D/C and start duloxetine.      Hyperlipidemia - Primary    Await labs. Adjust regimen as indicated by results.       Relevant Orders   Lipid panel   Comprehensive metabolic panel   BMI 26.8-34.1,DQQIW    Healthy eating, regular exercise. If she has OSA, treating it may help her in this area. Consider referral to [facility].      Depression with anxiety    Reduce sertraline to 25 mg daily x 1-2 weeks, the D/C and start duloxetine, which may also help with her chronic neck pain. Anticipatory guidance provided regarding initial adverse effects common with this medication.      Relevant Medications   DULoxetine (CYMBALTA) 30 MG capsule    Other Visit Diagnoses    Irritability       Hyperglycemia       Relevant Orders   Comprehensive metabolic panel   Hemoglobin A1c   Ventricular tachycardia (HCC)   (Chronic)     Continue follow-up per cardiology       Return in about 4 weeks (around 08/05/2016) for re-evaluation of mood.   Fara Chute, PA-C [facility] [place]

## 2016-07-08 NOTE — Patient Instructions (Addendum)
  Reduce the Zoloft to 1/2 tablet for the next 1-2 weeks. Then begin Cymbalta at the full dose.   IF you received an x-ray today, you will receive an invoice from [facility]. Please contact [facility] at [phone] with questions or concerns regarding your invoice.   IF you received labwork today, you will receive an invoice from [facility]. Please contact [facility] at [phone] with questions or concerns regarding your invoice.   Our billing staff will not be able to assist you with questions regarding bills from these companies.  You will be contacted with the lab results as soon as they are available. The fastest way to get your results is to activate your My Chart account. Instructions are located on the last page of this paperwork. If you have not heard from [place] regarding the results in 2 weeks, please contact this office.

## 2016-07-08 NOTE — Progress Notes (Signed)
Subjective:    Patient ID: [name], female    DOB: December 30, 1965, 51 y.o.   MRN: [id number] PCP: Harrison Mons, PA-C  HPI Presenting for 6 month hyperlipidemia follow-up.  12/2015: TC 202 TG 90 LDL 76 HDL 76 Glucose 91 HgbA1c 5.1%  Hyperlipidemia: Continuing lipitor daily. Not exercising as much as she feels she should, but has a treadmill at home. They are getting ready to move and feels she is getting quite a bit of activity around the house. Eats healthfully- fruits, vegetables, fish, occasional steak. Nonsmoker.  Anxiety/depression: Zoloft does not seem to be helping. She is concerned that this is causing her to gain weight. Feels increased fatigue as well. Would like to try something else. Denies SI/HI.  Sleep: Has not completed sleep study recommended by neurology.  Knee/neck pain: Discontinued meloxicam. Did not seem to help. Will take Diclofenac as needed. Pain persists. Understands she cannot take low dose prednisone.  GERD: Well-controlled on protonix.  Some congestion today but allergic rhinitis well-controlled. No concerns.   Patient Active Problem List   Diagnosis Date Noted  . Tremor of right hand 09/27/2015  . Clumsiness 09/27/2015  . Dysesthesia affecting both sides of body 08/14/2015  . BMI 25.0-25.9,adult 07/03/2015  . DDD (degenerative disc disease), cervical 07/03/2015  . Fibroids, submucosal 12/07/2013  . Depression with anxiety 10/19/2013  . Cough 10/19/2013  . Mild obesity 09/07/2013  . Hyperlipidemia 10/04/2008  . VENTRICULAR TACHYCARDIA 10/04/2008  . RLQ PAIN 06/07/2007  . CARPAL TUNNEL SYNDROME, BILATERAL 05/06/2007  . MITRAL VALVE DISORDERS 05/06/2007  . ABNORMAL HEART RHYTHMS 05/06/2007  . GERD 05/06/2007  . HIATAL HERNIA 05/06/2007  . IBS 05/06/2007  . PLANTAR FASCIITIS 05/06/2007   Prior to Admission medications   Medication Sig Start Date End Date Taking? Authorizing Provider  ALPRAZolam [name]) 1 MG tablet TAKE 1  TABLET BY MOUTH AT BEDTIME AS NEEDED FOR SLEEP 06/26/16   Harrison Mons, PA-C  atorvastatin (LIPITOR) 20 MG tablet TAKE 1 TABLET BY MOUTH EVERY DAY 05/14/16   Harrison Mons, PA-C  cefdinir (OMNICEF) 300 MG capsule Take 300 mg by mouth 2 (two) times daily.    [provider]  chlorpheniramine-HYDROcodone (TUSSIONEX PENNKINETIC ER) 10-8 MG/5ML SUER Take 5 mLs by mouth every 12 (twelve) hours as needed for cough. 04/14/16   Harrison Mons, PA-C  cyclobenzaprine (FLEXERIL) 10 MG tablet Take 10 mg by mouth at bedtime as needed. 07/13/15   [provider]  diclofenac (VOLTAREN) 75 MG EC tablet Take 75 mg by mouth daily as needed.    [provider]  ipratropium (ATROVENT) 0.03 % nasal spray Place 2 sprays into both nostrils 2 (two) times daily. 08/28/15   Harrison Mons, PA-C  meloxicam (MOBIC) 15 MG tablet Take 1 tablet (15 mg total) by mouth daily. 04/23/16   Harrison Mons, PA-C  pantoprazole (PROTONIX) 40 MG tablet TAKE 1 TABLET (40 MG TOTAL) BY MOUTH 2 (TWO) TIMES DAILY. 11/01/15   Irene Shipper, MD  predniSONE (DELTASONE) 20 MG tablet Take 3 PO QAM x3days, 2 PO QAM x3days, 1 PO QAM x3days 04/14/16   Jeffery, [name], PA-C  sertraline (ZOLOFT) 50 MG tablet TAKE 1 TABLET BY MOUTH DAILY 06/12/16   Harrison Mons, PA-C   Allergies  Allergen Reactions  . Covera-Hs [Verapamil]     Blood blisters, Stevens-Johnson syndrome  . Crestor [Rosuvastatin Calcium]     Flu like symptoms, on different statin  . Wellbutrin [Bupropion] Palpitations     Review of Systems  Constitutional: Positive for fatigue. Negative for activity change, chills and fever.  HENT: Negative for congestion.   Respiratory: Negative for cough and shortness of breath.   Cardiovascular: Negative for chest pain.  Gastrointestinal: Negative for abdominal pain, nausea and vomiting.  Musculoskeletal: Negative for arthralgias.  Neurological: Negative for dizziness and numbness.      Objective:   Physical Exam    Constitutional: She appears well-developed and well-nourished.  BP 116/80 (BP Location: Right Arm, Patient Position: Sitting, Cuff Size: Normal)   Pulse 78   Temp 97.7 F (36.5 C) (Oral)   Resp 18   Ht 5\' 3"  (1.6 m)   Wt 169 lb 3.2 oz (76.7 kg)   LMP 12/03/2014   SpO2 96%   BMI 29.97 kg/m    HENT:  Head: Normocephalic and atraumatic.  Right Ear: External ear normal.  Left Ear: External ear normal.  Nose: Nose normal.  Neck: Normal range of motion. Neck supple.  Cardiovascular: Normal rate, regular rhythm and normal heart sounds.   Pulses:      Radial pulses are 2+ on the right side, and 2+ on the left side.       Dorsalis pedis pulses are 2+ on the right side, and 2+ on the left side.       Posterior tibial pulses are 2+ on the right side, and 2+ on the left side.  Pulmonary/Chest: Effort normal and breath sounds normal.  Lymphadenopathy:       Head (right side): No submental, no submandibular, no tonsillar, no preauricular, no posterior auricular and no occipital adenopathy present.       Head (left side): No submental, no submandibular, no tonsillar, no preauricular, no posterior auricular and no occipital adenopathy present.    She has no cervical adenopathy.  Neurological: She is alert. She has normal reflexes.  Skin: Skin is warm and dry. No erythema.  Psychiatric: She has a normal mood and affect. Her behavior is normal.       Assessment & Plan:   1. Hyperlipidemia, unspecified hyperlipidemia type Awaiting labs.  - Lipid panel - Comprehensive metabolic panel  2. Depression with anxiety 3. Irritability 4. Chronic fatigue Rx cymbalta. Instructed to taper zoloft 1/2 tablet x1-2 weeks before starting cymbalta. Reviewed side effects of tapering zoloft and initiation of cymbalta, including nausea and vomiting. Advised she call if she experiences these symptoms and we can prescribe something for her. Encouraged scheduling sleep study as recommended by neurology. -  DULoxetine (CYMBALTA) 30 MG capsule; Take 1 capsule (30 mg total) by mouth daily.  Dispense: 30 capsule; Refill: 0  5. Mild obesity Continue healthy diet and exercise.  6. Hyperglycemia Awaiting labs.  - Comprehensive metabolic panel - Hemoglobin A1c  7. Ventricular tachycardia (HCC) Stable.

## 2016-07-08 NOTE — Assessment & Plan Note (Signed)
Healthy eating, regular exercise. If she has OSA, treating it may help her in this area. Consider referral to [facility].

## 2016-07-08 NOTE — Assessment & Plan Note (Signed)
Await labs. Adjust regimen as indicated by results.  

## 2016-07-08 NOTE — Assessment & Plan Note (Signed)
Proceed with sleep study, per neurology recommendations. SSRI may contribute, so reduce to 25 mg for 1-2 weeks, then D/C and start duloxetine.

## 2016-07-08 NOTE — Assessment & Plan Note (Signed)
Reduce sertraline to 25 mg daily x 1-2 weeks, the D/C and start duloxetine, which may also help with her chronic neck pain. Anticipatory guidance provided regarding initial adverse effects common with this medication.

## 2016-07-09 LAB — COMPREHENSIVE METABOLIC PANEL
ALBUMIN: 4.3 g/dL (ref 3.5–5.5)
ALK PHOS: 60 IU/L (ref 39–117)
ALT: 13 IU/L (ref 0–32)
AST: 15 IU/L (ref 0–40)
Albumin/Globulin Ratio: 2 (ref 1.2–2.2)
BUN / CREAT RATIO: 28 — AB (ref 9–23)
BUN: 19 mg/dL (ref 6–24)
Bilirubin Total: 0.4 mg/dL (ref 0.0–1.2)
CO2: 23 mmol/L (ref 20–29)
CREATININE: 0.67 mg/dL (ref 0.57–1.00)
Calcium: 8.8 mg/dL (ref 8.7–10.2)
Chloride: 101 mmol/L (ref 96–106)
GFR, EST AFRICAN AMERICAN: 117 mL/min/{1.73_m2} (ref >59)
GFR, EST NON AFRICAN AMERICAN: 101 mL/min/{1.73_m2} (ref >59)
GLOBULIN, TOTAL: 2.1 g/dL (ref 1.5–4.5)
Glucose: 98 mg/dL (ref 65–99)
Potassium: 4 mmol/L (ref 3.5–5.2)
SODIUM: 139 mmol/L (ref 134–144)
TOTAL PROTEIN: 6.4 g/dL (ref 6.0–8.5)

## 2016-07-09 LAB — LIPID PANEL
CHOL/HDL RATIO: 3.8 ratio (ref 0.0–4.4)
CHOLESTEROL TOTAL: 222 mg/dL — AB (ref 100–199)
HDL: 58 mg/dL (ref >39)
LDL CALC: 127 mg/dL — AB (ref 0–99)
Triglycerides: 186 mg/dL — ABNORMAL HIGH (ref 0–149)
VLDL Cholesterol Cal: 37 mg/dL (ref 5–40)

## 2016-07-09 LAB — HEMOGLOBIN A1C
Est. average glucose Bld gHb Est-mCnc: 103 mg/dL
HEMOGLOBIN A1C: 5.2 % (ref 4.8–5.6)

## 2016-07-23 ENCOUNTER — Other Ambulatory Visit: Payer: Self-pay | Admitting: Physician Assistant

## 2016-07-23 DIAGNOSIS — F418 Other specified anxiety disorders: Secondary | ICD-10-CM

## 2016-07-29 ENCOUNTER — Telehealth: Payer: Self-pay

## 2016-07-29 MED ORDER — ALPRAZOLAM 1 MG PO TABS: 1.0000 mg | ORAL_TABLET | Freq: Every evening | ORAL | 0 refills | Status: DC | PRN

## 2016-07-29 NOTE — Telephone Encounter (Signed)
Meds ordered this encounter  Medications  . ALPRAZolam (XANAX) 1 MG tablet    Sig: Take 1 tablet (1 mg total) by mouth at bedtime as needed. for sleep    Dispense:  30 tablet    Refill:  0    Not to exceed 5 additional fills before 11/22/2016

## 2016-07-29 NOTE — Telephone Encounter (Signed)
Xanax rx faxed to pharmacy and received success notice.

## 2016-07-30 ENCOUNTER — Other Ambulatory Visit: Payer: Self-pay | Admitting: Physician Assistant

## 2016-07-30 DIAGNOSIS — F418 Other specified anxiety disorders: Secondary | ICD-10-CM

## 2016-07-31 MED ORDER — DULOXETINE HCL 30 MG PO CPEP: 30.0000 mg | ORAL_CAPSULE | Freq: Every day | ORAL | 3 refills | Status: DC

## 2016-08-05 ENCOUNTER — Ambulatory Visit (INDEPENDENT_AMBULATORY_CARE_PROVIDER_SITE_OTHER): Payer: 59 | Admitting: Physician Assistant

## 2016-08-05 ENCOUNTER — Encounter: Payer: Self-pay | Admitting: Physician Assistant

## 2016-08-05 ENCOUNTER — Other Ambulatory Visit: Payer: Self-pay | Admitting: Internal Medicine

## 2016-08-05 VITALS — BP 129/86 | HR 79 | Temp 97.9°F | Resp 18 | Ht 63.0 in | Wt 169.8 lb

## 2016-08-05 DIAGNOSIS — F418 Other specified anxiety disorders: Secondary | ICD-10-CM

## 2016-08-05 DIAGNOSIS — M5030 Other cervical disc degeneration, unspecified cervical region: Secondary | ICD-10-CM | POA: Diagnosis not present

## 2016-08-05 MED ORDER — DULOXETINE HCL 60 MG PO CPEP: 60.0000 mg | ORAL_CAPSULE | Freq: Every day | ORAL | 3 refills | Status: DC

## 2016-08-05 NOTE — Patient Instructions (Addendum)
Increase the duloxetine to 60 mg daily. Let me know how it's working! If it doesn't HELP more than the 30 mg, I recommend we go back to the lower dose.    IF you received an x-ray today, you will receive an invoice from [facility]. Please contact [facility] at [phone] with questions or concerns regarding your invoice.   IF you received labwork today, you will receive an invoice from [facility]. Please contact [facility] at [phone] with questions or concerns regarding your invoice.   Our billing staff will not be able to assist you with questions regarding bills from these companies.  You will be contacted with the lab results as soon as they are available. The fastest way to get your results is to activate your My Chart account. Instructions are located on the last page of this paperwork. If you have not heard from [place] regarding the results in 2 weeks, please contact this office.

## 2016-08-05 NOTE — Progress Notes (Signed)
Patient ID: [name], female    DOB: 1965-12-25, 51 y.o.   MRN: [id number]  PCP: Harrison Mons, PA-C  Chief Complaint  Patient presents with  . Medication follow up    per pt states that Cymbalta has been helping her mood and pain.    Subjective:   Presents for evaluation of anxiety and depression since starting duloxetine.  She was changed from sertraline due to concerns that it was causing weight gain, and in hopes that it would also help reduce her neck pain. She is tolerating it well, had no nausea/vomiting or diarrhea, and has helped with both her mood and pain, though pain persists.    Review of Systems Constitutional: Negative for chills, fever and malaise/fatigue.  HENT: Negative for ear pain, sinus pain, sore throat and tinnitus.   Eyes: Negative for blurred vision and double vision.  Respiratory: Negative for cough, shortness of breath and wheezing.   Cardiovascular: Negative for chest pain.  Gastrointestinal: Negative for abdominal pain, constipation, diarrhea, heartburn, nausea and vomiting.  Genitourinary: Negative.   Musculoskeletal: Positive for back pain, joint pain and neck pain.  Skin: Negative for itching and rash.  Neurological: Negative for dizziness and headaches.  Psychiatric/Behavioral: Negative for depression and suicidal ideas.     Patient Active Problem List   Diagnosis Date Noted  . Chronic fatigue 07/08/2016  . Tremor of right hand 09/27/2015  . Clumsiness 09/27/2015  . Dysesthesia affecting both sides of body 08/14/2015  . DDD (degenerative disc disease), cervical 07/03/2015  . Fibroids, submucosal 12/07/2013  . Depression with anxiety 10/19/2013  . Cough 10/19/2013  . BMI 29.0-29.9,adult 09/07/2013  . Hyperlipidemia 10/04/2008  . VENTRICULAR TACHYCARDIA 10/04/2008  . RLQ PAIN 06/07/2007  . CARPAL TUNNEL SYNDROME, BILATERAL 05/06/2007  . MITRAL VALVE DISORDERS 05/06/2007  . ABNORMAL HEART RHYTHMS 05/06/2007  . GERD  05/06/2007  . HIATAL HERNIA 05/06/2007  . IBS 05/06/2007  . PLANTAR FASCIITIS 05/06/2007     Prior to Admission medications   Medication Sig Start Date End Date Taking? Authorizing Provider  ALPRAZolam [name]) 1 MG tablet Take 1 tablet (1 mg total) by mouth at bedtime as needed. for sleep 07/29/16  Yes Mae Cianci, PA-C  atorvastatin (LIPITOR) 20 MG tablet TAKE 1 TABLET BY MOUTH EVERY DAY 05/14/16  Yes Jacquelyne Quarry, PA-C  cyclobenzaprine (FLEXERIL) 10 MG tablet Take 10 mg by mouth at bedtime as needed. 07/13/15  Yes [provider]  diclofenac (VOLTAREN) 75 MG EC tablet Take 75 mg by mouth daily as needed.   Yes [provider]  DULoxetine (CYMBALTA) 30 MG capsule Take 1 capsule (30 mg total) by mouth daily. 07/31/16  Yes Airyn Ellzey, PA-C  ipratropium (ATROVENT) 0.03 % nasal spray Place 2 sprays into both nostrils 2 (two) times daily. 08/28/15  Yes Kamel Haven, PA-C  Multiple Vitamin (MULTI-VITAMINS) TABS Take by mouth.   Yes [provider]  pantoprazole (PROTONIX) 40 MG tablet TAKE 1 TABLET TWICE A DAY 08/05/16  Yes Irene Shipper, MD     Allergies  Allergen Reactions  . Covera-Hs [Verapamil]     Blood blisters, Stevens-Johnson syndrome  . Crestor [Rosuvastatin Calcium]     Flu like symptoms, on different statin  . Wellbutrin [Bupropion] Palpitations       Objective:  Physical Exam  Constitutional: She is oriented to person, place, and time. She appears well-developed and well-nourished. She is active and cooperative. No distress.  BP 129/86 (BP Location: Right Arm, Patient Position:  Sitting, Cuff Size: Normal)   Pulse 79   Temp 97.9 F (36.6 C) (Oral)   Resp 18   Ht 5\' 3"  (1.6 m)   Wt 169 lb 12.8 oz (77 kg)   LMP 12/03/2014   SpO2 98%   BMI 30.08 kg/m    Eyes: Conjunctivae are normal.  Pulmonary/Chest: Effort normal.  Neurological: She is alert and oriented to person, place, and time.  Psychiatric: She has a normal mood and affect.  Her speech is normal and behavior is normal.       Assessment & Plan:   Problem List Items Addressed This Visit    Depression with anxiety    Continue duloxetine. Increase dose to see if her pain improves. If not, will resume lower dose.      Relevant Medications   DULoxetine (CYMBALTA) 60 MG capsule   DDD (degenerative disc disease), cervical - Primary    Try increasing duloxetine.      Relevant Medications   DULoxetine (CYMBALTA) 60 MG capsule       Return in about 3 months (around 11/05/2016) for re-evaluation of mood and cholesterol, sooner if needed.   Fara Chute, PA-C [facility] [place]

## 2016-08-05 NOTE — Progress Notes (Signed)
Chief Complaint  Patient presents with  . Medication follow up    per pt states that Cymbalta has been helping her mood and pain.   HPI: [name] is a 51 yo female with a significant past medical history of depression and anxiety who presents to the clinic today for a medication follow up.  Cymbalta is making a little difference with her pain. Has been on it for a month and did not experience side effects for the transition to cymbalta from zoloft. Would like to discuss increasing the dose if this would diminish the neck, knees, and hip pain. She is satisfied with Cymbalta with regards to her mood.    Takes Amoxicillin before dental appointments.   Review of Systems  Constitutional: Negative for chills, fever and malaise/fatigue.  HENT: Negative for ear pain, sinus pain, sore throat and tinnitus.   Eyes: Negative for blurred vision and double vision.  Respiratory: Negative for cough, shortness of breath and wheezing.   Cardiovascular: Negative for chest pain.  Gastrointestinal: Negative for abdominal pain, constipation, diarrhea, heartburn, nausea and vomiting.  Genitourinary: Negative.   Musculoskeletal: Positive for back pain, joint pain and neck pain.  Skin: Negative for itching and rash.  Neurological: Negative for dizziness and headaches.  Psychiatric/Behavioral: Negative for depression and suicidal ideas.    Patient Active Problem List   Diagnosis Date Noted  . Chronic fatigue 07/08/2016  . Tremor of right hand 09/27/2015  . Clumsiness 09/27/2015  . Dysesthesia affecting both sides of body 08/14/2015  . DDD (degenerative disc disease), cervical 07/03/2015  . Fibroids, submucosal 12/07/2013  . Depression with anxiety 10/19/2013  . Cough 10/19/2013  . BMI 29.0-29.9,adult 09/07/2013  . Hyperlipidemia 10/04/2008  . VENTRICULAR TACHYCARDIA 10/04/2008  . RLQ PAIN 06/07/2007  . CARPAL TUNNEL SYNDROME, BILATERAL 05/06/2007  . Mitral valve disease 05/06/2007  . ABNORMAL HEART  RHYTHMS 05/06/2007  . GERD 05/06/2007  . HIATAL HERNIA 05/06/2007  . IBS 05/06/2007  . PLANTAR FASCIITIS 05/06/2007   Allergies  Allergen Reactions  . Covera-Hs [Verapamil]     Blood blisters, Stevens-Johnson syndrome  . Crestor [Rosuvastatin Calcium]     Flu like symptoms, on different statin  . Wellbutrin [Bupropion] Palpitations   Prior to Admission medications   Medication Sig Start Date End Date Taking? Authorizing Provider  ALPRAZolam [name]) 1 MG tablet Take 1 tablet (1 mg total) by mouth at bedtime as needed. for sleep 07/29/16  Yes Jeffery, [name], PA-C  atorvastatin (LIPITOR) 20 MG tablet TAKE 1 TABLET BY MOUTH EVERY DAY 05/14/16  Yes Jeffery, [name], PA-C  cyclobenzaprine (FLEXERIL) 10 MG tablet Take 10 mg by mouth at bedtime as needed. 07/13/15  Yes [provider]  diclofenac (VOLTAREN) 75 MG EC tablet Take 75 mg by mouth daily as needed.   Yes [provider]  DULoxetine (CYMBALTA) 60 MG capsule Take 1 capsule (60 mg total) by mouth daily. 08/05/16  Yes Jeffery, [name], PA-C  ipratropium (ATROVENT) 0.03 % nasal spray Place 2 sprays into both nostrils 2 (two) times daily. 08/28/15  Yes Jeffery, [name], PA-C  Multiple Vitamin (MULTI-VITAMINS) TABS Take by mouth.   Yes [provider]  pantoprazole (PROTONIX) 40 MG tablet TAKE 1 TABLET TWICE A DAY 08/05/16  Yes Irene Shipper, MD   Vital Signs BP 129/86 (BP Location: Right Arm, Patient Position: Sitting, Cuff Size: Normal)   Pulse 79   Temp 97.9 F (36.6 C) (Oral)   Resp 18   Ht 5\' 3"  (1.6 m)   Wt  169 lb 12.8 oz (77 kg)   LMP 12/03/2014   SpO2 98%   BMI 30.08 kg/m   Physical Exam  Cardiovascular: Normal rate and regular rhythm.   Pulses:      Radial pulses are 2+ on the right side, and 2+ on the left side.  Murmur noted  Lymphadenopathy:    She has no cervical adenopathy.       Right: No supraclavicular adenopathy present.       Left: No supraclavicular adenopathy present.    Assessment  and Plan 1. Depression with anxiety If left over 30mg  tablets of cymbalta at home, take 2 at the same time. Return in 3 months for re-evaluation of mood - DULoxetine (CYMBALTA) 60 MG capsule; Take 1 capsule (60 mg total) by mouth daily.  Dispense: 30 capsule; Refill: 3  2. DDD (degenerative disc disease), cervical - DULoxetine (CYMBALTA) 60 MG capsule; Take 1 capsule (60 mg total) by mouth daily.  Dispense: 30 capsule; Refill: 3  Joel Mericle "Mia" Ronald Londo, [facility] 08/05/16 6:27 PM

## 2016-08-07 NOTE — Assessment & Plan Note (Signed)
Try increasing duloxetine.

## 2016-08-07 NOTE — Assessment & Plan Note (Signed)
Continue duloxetine. Increase dose to see if her pain improves. If not, will resume lower dose.

## 2016-08-21 ENCOUNTER — Other Ambulatory Visit: Payer: Self-pay | Admitting: Physician Assistant

## 2016-09-18 ENCOUNTER — Other Ambulatory Visit: Payer: Self-pay | Admitting: Physician Assistant

## 2016-09-18 ENCOUNTER — Telehealth: Payer: Self-pay

## 2016-09-18 DIAGNOSIS — F418 Other specified anxiety disorders: Secondary | ICD-10-CM

## 2016-09-18 NOTE — Telephone Encounter (Signed)
Called in rx for xanax. Unable to reach pt on provided numbers.

## 2016-09-18 NOTE — Telephone Encounter (Signed)
Please advise 

## 2016-09-18 NOTE — Telephone Encounter (Signed)
Meds ordered this encounter  Medications  . ALPRAZolam (XANAX) 1 MG tablet    Sig: TAKE 1 TABLET BY MOUTH PHS AS NEEDED FOR SLEEP    Dispense:  30 tablet    Refill:  0    Not to exceed 5 additional fills before 12/23/2016

## 2016-09-29 ENCOUNTER — Encounter: Payer: Self-pay | Admitting: Internal Medicine

## 2016-10-17 ENCOUNTER — Other Ambulatory Visit: Payer: Self-pay | Admitting: Physician Assistant

## 2016-10-17 ENCOUNTER — Telehealth: Payer: Self-pay

## 2016-10-17 DIAGNOSIS — F418 Other specified anxiety disorders: Secondary | ICD-10-CM

## 2016-10-17 NOTE — Telephone Encounter (Signed)
Meds ordered this encounter  Medications  . ALPRAZolam (XANAX) 1 MG tablet    Sig: TAKE 1 TABLET BY MOUTH AS NEEDED FOR SLEEP    Dispense:  30 tablet    Refill:  0    Not to exceed 5 additional fills before 03/17/2017

## 2016-10-17 NOTE — Telephone Encounter (Signed)
Please advice  

## 2016-10-28 ENCOUNTER — Encounter: Payer: Self-pay | Admitting: Physician Assistant

## 2016-10-28 ENCOUNTER — Ambulatory Visit (INDEPENDENT_AMBULATORY_CARE_PROVIDER_SITE_OTHER): Payer: 59 | Admitting: Physician Assistant

## 2016-10-28 VITALS — BP 124/84 | HR 113 | Temp 97.9°F | Resp 18 | Ht 63.0 in | Wt 177.6 lb

## 2016-10-28 DIAGNOSIS — R5382 Chronic fatigue, unspecified: Secondary | ICD-10-CM

## 2016-10-28 DIAGNOSIS — M5030 Other cervical disc degeneration, unspecified cervical region: Secondary | ICD-10-CM

## 2016-10-28 DIAGNOSIS — F418 Other specified anxiety disorders: Secondary | ICD-10-CM

## 2016-10-28 DIAGNOSIS — Z23 Encounter for immunization: Secondary | ICD-10-CM

## 2016-10-28 NOTE — Progress Notes (Signed)
Subjective:    Patient ID: [name], female    DOB: 28-Aug-1965, 51 y.o.   MRN: [id number]  HPI  [name] is a 51 year old Caucasian female with a history of insomnia, depression, herniated disks, and chronic fatigue who presents today to discuss testing for fibromyalgia. She reports her friend recently recommended 2 tests that can test for fibromyalgia. Her results recently came back positive from [facility], and now she would like a referral to [facility] in [place] for further testing. [name] reports the company states their results are 95% accurate.   [name] states she never wakes up feeling refreshed.  She reports she feels tightness and pain in her shoulders and neck. [name] reports she has had several surgeries on her back, herniated discs, and bulging discs and is concerned about her pain at this time. She report she also previously had tingling down her legs that has resolved. [name] states she now as pain on the lateral aspects of bilateral hips.  Medications: Alprazolam 1mg  daily for sleep Flexeril to take as needed  Diclofenac 75mg  as needed  Cymbalta 60mg  daily   Allergies: Verapamil (reaction: blood blisters, Stevens-Johnson syndrome) Crestor (reaction: flu like symptoms)  Wellbutrin (reaction: palpitations)   Chronic Medical Conditions: Patient Active Problem List   Diagnosis Date Noted  . Chronic fatigue 07/08/2016  . Tremor of right hand 09/27/2015  . Clumsiness 09/27/2015  . Dysesthesia affecting both sides of body 08/14/2015  . DDD (degenerative disc disease), cervical 07/03/2015  . Fibroids, submucosal 12/07/2013  . Depression with anxiety 10/19/2013  . Cough 10/19/2013  . BMI 29.0-29.9,adult 09/07/2013  . Hyperlipidemia 10/04/2008  . VENTRICULAR TACHYCARDIA 10/04/2008  . RLQ PAIN 06/07/2007  . CARPAL TUNNEL SYNDROME, BILATERAL 05/06/2007  . Mitral valve disease 05/06/2007  . ABNORMAL HEART RHYTHMS  05/06/2007  . GERD 05/06/2007  . HIATAL HERNIA 05/06/2007  . IBS 05/06/2007  . PLANTAR FASCIITIS 05/06/2007   Past Surgical History:  Procedure Laterality Date  . ABDOMINAL HYSTERECTOMY    . BILATERAL SALPINGECTOMY Bilateral 12/07/2013   Procedure: BILATERAL SALPINGECTOMY;  Surgeon: Lovenia Kim, MD;  Location: [facility];  Service: Gynecology;  Laterality: Bilateral;  . CARDIAC ELECTROPHYSIOLOGY [place] AND ABLATION  2002  . cspine  05/1999   c5-6  . echocardiogram  2000/2001  . KNEE SURGERY  1998  . PATELLA ARTHROPLASTY    . ROBOTIC ASSISTED TOTAL HYSTERECTOMY N/A 12/07/2013   Procedure: ROBOTIC ASSISTED TOTAL HYSTERECTOMY;  Surgeon: Lovenia Kim, MD;  Location: [facility];  Service: Gynecology;  Laterality: N/A;  . SPINE SURGERY     Review of Systems     Objective:   Physical Exam  Constitutional: She appears well-developed and well-nourished. She is cooperative.  BP 124/84 (BP Location: Left Arm, Patient Position: Sitting, Cuff Size: Normal)   Pulse (!) 113   Temp 97.9 F (36.6 C) (Oral)   Resp 18   Ht 5\' 3"  (1.6 m)   Wt 177 lb 9.6 oz (80.6 kg)   LMP 12/03/2014   SpO2 97%   BMI 31.46 kg/m    HENT:  Head: Normocephalic and atraumatic.  Neck: Normal range of motion. Neck supple. No thyromegaly present.  Cardiovascular: Normal rate, regular rhythm and normal heart sounds.   Pulmonary/Chest: Effort normal and breath sounds normal.  Musculoskeletal:       Right hip: She exhibits tenderness.       Left hip: She exhibits tenderness.  Mildly tender to palpation over  cervical spine and posterior aspect of shoulders. Full passive and active range of motion of bilateral upper extremities. Strength 5/5 in bilateral upper extremities. Pain with flexion of the neck. No pain with flexion of the hip. Mild pain with extension of the hip and lateral flexion of hips bilaterally.  Lymphadenopathy:    She has no cervical adenopathy.  Neurological: She is alert.      Assessment &  Plan:  1. DDD (degenerative disc disease), cervical - Informed patient that we will continue to investigate [facility] for fibromyalgia testing as well [facility] testing - Also informed patient that we will reach out to rheumatology for their recommendations   2. Chronic fatigue - Informed patient that we will continue to investigate [facility] for fibromyalgia testing as well IQu[facility] testing - Also informed patient that we will reach out to rheumatology for their recommendations   3. Health Maintenance - Tdap vaccine greater than or equal to 7yo IM administered today in clinic.  - Flu Vaccine QUAD 36+ mos IM administered today in clinic   Kaydense Rizo, PA-S

## 2016-10-28 NOTE — Progress Notes (Signed)
Patient ID: [name], female    DOB: [birth year], 51 y.o.   MRN: [id number]  PCP: Harrison Mons, PA-C  Chief Complaint  Patient presents with  . Immunizations    Flu and Tdap    Subjective:   Presents for influenza and Tdap vaccines, as her daughter has recently given birth to twin girls.  The pregnancy was complicated, and the delivery was early, on 9/21. Chloe came home last week, Mel Almond remains in hospital due to feeding issues, possibly aspiration, and is scheduled for a swallow test.  In addition, she relates that she has recently had a test for fibromyalgia, recommended by a friend, and would like my opinion on another test she's read about, but that requires an order from a health care provider. She is interested in the possibility that fibromyalgia is the cause of her insomnia, depression, chronic fatigue and neck pain. She has had a blood test with [facility] (they have a physician who provides the order, and interprets the results, of blood drawn locally to the patient). It was positive. She finds a sense of relief and hopefulness that this diagnosis may explain her chronic symptoms, and expresses some shame that she has judged people who have reported this diagnosis themselves in the past.  She would like to know if I think it's appropriate to do a similar test with another company, [facility], based in [place], as they report result accuracy of 95%.  From the [facility] web site: IsolateFibromyalgia is a first-of-its-kind RNA blood test that quickly and accurately identifies fibromyalgia syndrome, helping providers and patients develop the best treatment plan. This new test is more than 90% accurate and delivers a yes or no result to your provider within a week of receiving the sample in the laboratory.  Additional information for providers requires provision of an email address, and is not available otherwise.    Review of Systems  Constitutional: Positive for  fatigue. Negative for chills and fever.  Eyes: Negative for visual disturbance.  Respiratory: Negative for cough and shortness of breath.   Cardiovascular: Negative for chest pain, palpitations and leg swelling.  Musculoskeletal: Positive for arthralgias (neck, hips).  Hematological: Negative.   Psychiatric/Behavioral: Positive for dysphoric mood (well controlled) and sleep disturbance (well controlled).       Patient Active Problem List   Diagnosis Date Noted  . Chronic fatigue 07/08/2016  . Tremor of right hand 09/27/2015  . Clumsiness 09/27/2015  . Dysesthesia affecting both sides of body 08/14/2015  . DDD (degenerative disc disease), cervical 07/03/2015  . Fibroids, submucosal 12/07/2013  . Depression with anxiety 10/19/2013  . Cough 10/19/2013  . BMI 29.0-29.9,adult 09/07/2013  . Hyperlipidemia 10/04/2008  . VENTRICULAR TACHYCARDIA 10/04/2008  . RLQ PAIN 06/07/2007  . CARPAL TUNNEL SYNDROME, BILATERAL 05/06/2007  . Mitral valve disease 05/06/2007  . ABNORMAL HEART RHYTHMS 05/06/2007  . GERD 05/06/2007  . HIATAL HERNIA 05/06/2007  . IBS 05/06/2007  . PLANTAR FASCIITIS 05/06/2007     Prior to Admission medications   Medication Sig Start Date End Date Taking? Authorizing Provider  ALPRAZolam [name]) 1 MG tablet TAKE 1 TABLET BY MOUTH AS NEEDED FOR SLEEP 10/17/16  Yes Anneke Cundy, PA-C  atorvastatin (LIPITOR) 20 MG tablet TAKE 1 TABLET BY MOUTH EVERY DAY 08/21/16  Yes Toribio Seiber, PA-C  cyclobenzaprine (FLEXERIL) 10 MG tablet Take 10 mg by mouth at bedtime as needed. 07/13/15  Yes [provider]  diclofenac (VOLTAREN) 75 MG EC tablet Take 75 mg  by mouth daily as needed.   Yes [provider]  DULoxetine (CYMBALTA) 60 MG capsule Take 1 capsule (60 mg total) by mouth daily. 08/05/16  Yes Alondria Mousseau, PA-C  ipratropium (ATROVENT) 0.03 % nasal spray Place 2 sprays into both nostrils 2 (two) times daily. 08/28/15  Yes Alic Hilburn, PA-C  Multiple  Vitamin (MULTI-VITAMINS) TABS Take by mouth.   Yes [provider]  pantoprazole (PROTONIX) 40 MG tablet TAKE 1 TABLET TWICE A DAY 08/05/16  Yes Irene Shipper, MD     Allergies  Allergen Reactions  . Covera-Hs [Verapamil]     Blood blisters, Stevens-Johnson syndrome  . Crestor [Rosuvastatin Calcium]     Flu like symptoms, on different statin  . Wellbutrin [Bupropion] Palpitations       Objective:  Physical Exam  Constitutional: She is oriented to person, place, and time. She appears well-developed and well-nourished. She is active and cooperative. No distress.  BP 124/84 (BP Location: Left Arm, Patient Position: Sitting, Cuff Size: Normal)   Pulse (!) 113   Temp 97.9 F (36.6 C) (Oral)   Resp 18   Ht 5\' 3"  (1.6 m)   Wt 177 lb 9.6 oz (80.6 kg)   LMP 12/03/2014   SpO2 97%   BMI 31.46 kg/m   HENT:  Head: Normocephalic and atraumatic.  Right Ear: Hearing normal.  Left Ear: Hearing normal.  Eyes: Conjunctivae are normal. No scleral icterus.  Neck: Normal range of motion. Neck supple. No thyromegaly present.  Cardiovascular: Normal rate, regular rhythm and normal heart sounds.   Pulses:      Radial pulses are 2+ on the right side, and 2+ on the left side.  Pulmonary/Chest: Effort normal and breath sounds normal.  Musculoskeletal:       Cervical back: She exhibits tenderness, bony tenderness and pain. She exhibits normal range of motion, no swelling, no edema, no deformity, no laceration, no spasm and normal pulse.  Lymphadenopathy:       Head (right side): No tonsillar, no preauricular, no posterior auricular and no occipital adenopathy present.       Head (left side): No tonsillar, no preauricular, no posterior auricular and no occipital adenopathy present.    She has no cervical adenopathy.       Right: No supraclavicular adenopathy present.       Left: No supraclavicular adenopathy present.  Neurological: She is alert and oriented to person, place, and time. No  sensory deficit.  Skin: Skin is warm, dry and intact. No rash noted. No cyanosis or erythema. Nails show no clubbing.  Psychiatric: She has a normal mood and affect. Her speech is normal and behavior is normal.           Assessment & Plan:   Problem List Items Addressed This Visit    Chronic fatigue (Chronic)   Depression with anxiety   DDD (degenerative disc disease), cervical - Primary    Other Visit Diagnoses    Need for Tdap vaccination       Relevant Orders   Tdap vaccine greater than or equal to 7yo IM (Completed)   Flu vaccine need       Relevant Orders   Flu Vaccine QUAD 36+ mos IM (Completed)     I do not think that the neck pain represents a component of fibromyalgia-she has known DDD of the c-spine and has had several procedures to address that. And, she does not have widespread pain syndrome. She is already on an SNRI  and cyclobenzaprine. We could consider NSAID, tramadol and formal exercise regimen.  Return for re-evaluaiton as previously planned.Fara Chute, PA-C [facility]

## 2016-10-28 NOTE — Patient Instructions (Signed)
     IF you received an x-ray today, you will receive an invoice from [facility]. Please contact [facility] at [phone] with questions or concerns regarding your invoice.   IF you received labwork today, you will receive an invoice from [facility]. Please contact [facility] at [phone] with questions or concerns regarding your invoice.   Our billing staff will not be able to assist you with questions regarding bills from these companies.  You will be contacted with the lab results as soon as they are available. The fastest way to get your results is to activate your My Chart account. Instructions are located on the last page of this paperwork. If you have not heard from us regarding the results in 2 weeks, please contact this office.     

## 2016-11-14 ENCOUNTER — Other Ambulatory Visit: Payer: Self-pay | Admitting: Physician Assistant

## 2016-11-14 ENCOUNTER — Telehealth: Payer: Self-pay

## 2016-11-14 DIAGNOSIS — F418 Other specified anxiety disorders: Secondary | ICD-10-CM

## 2016-11-14 MED ORDER — ALPRAZOLAM 1 MG PO TABS: 1.0000 mg | ORAL_TABLET | Freq: Every evening | ORAL | 0 refills | Status: DC | PRN

## 2016-11-14 NOTE — Telephone Encounter (Signed)
Controlled medication.

## 2016-11-14 NOTE — Telephone Encounter (Signed)
Meds ordered this encounter  Medications  . ALPRAZolam (XANAX) 1 MG tablet    Sig: Take 1 tablet (1 mg total) at bedtime as needed by mouth for anxiety or sleep.    Dispense:  30 tablet    Refill:  0    Not to exceed 5 additional fills before 03/17/2017

## 2016-11-14 NOTE — Telephone Encounter (Signed)
Please advise 

## 2016-12-13 ENCOUNTER — Other Ambulatory Visit: Payer: Self-pay | Admitting: Physician Assistant

## 2016-12-13 DIAGNOSIS — F418 Other specified anxiety disorders: Secondary | ICD-10-CM

## 2016-12-25 ENCOUNTER — Telehealth: Payer: Self-pay | Admitting: Physician Assistant

## 2016-12-25 NOTE — Telephone Encounter (Signed)
Pt states she has been experiencing symptoms for the past 24 hours.

## 2016-12-25 NOTE — Telephone Encounter (Signed)
Pt asking for Tamiflu. Pt states she has been having a fever intermittently and feels like she is coming down with the flu, which she has had before. Pt advised that she would need to be seen for an appt and appt scheduled on 12/21.

## 2016-12-26 ENCOUNTER — Ambulatory Visit: Payer: Self-pay | Admitting: Physician Assistant

## 2017-01-01 NOTE — Telephone Encounter (Signed)
Error-Close Encounter 

## 2017-01-12 ENCOUNTER — Other Ambulatory Visit: Payer: Self-pay | Admitting: Physician Assistant

## 2017-01-12 DIAGNOSIS — F418 Other specified anxiety disorders: Secondary | ICD-10-CM

## 2017-01-12 NOTE — Telephone Encounter (Signed)
Xanax refill. Last refill 12/14/16.

## 2017-01-14 NOTE — Telephone Encounter (Signed)
Copied from [place] [id number]. Topic: Quick Communication - Rx Refill/Question >> Jan 14, 2017 11:21 AM Marin Olp L wrote: Medication: Alprazolam Has the patient contacted their pharmacy? Yes.   (Agent: If no, request that the patient contact the pharmacy for the refill.) Preferred Pharmacy (with phone number or street name): [id number] - [place], [place] - [address] Agent: Please be advised that RX refills may take up to 3 business days. We ask that you follow-up with your pharmacy.

## 2017-01-14 NOTE — Telephone Encounter (Signed)
Rx sent electronically.  Meds ordered this encounter  Medications  . ALPRAZolam (XANAX) 1 MG tablet    Sig: TAKE 1 TABLET BY MOUTH AT BEDTIME AS NEEDED FOR ANXIETY    Dispense:  30 tablet    Refill:  0    Not to exceed 5 additional fills before 06/12/2017

## 2017-01-14 NOTE — Telephone Encounter (Signed)
Alprazolam refill req sent to [facility]

## 2017-02-01 ENCOUNTER — Other Ambulatory Visit: Payer: Self-pay | Admitting: Internal Medicine

## 2017-02-11 ENCOUNTER — Other Ambulatory Visit: Payer: Self-pay | Admitting: Physician Assistant

## 2017-02-11 DIAGNOSIS — F418 Other specified anxiety disorders: Secondary | ICD-10-CM

## 2017-02-11 NOTE — Telephone Encounter (Signed)
Rx sent electronically.  Meds ordered this encounter  Medications  . ALPRAZolam (XANAX) 1 MG tablet    Sig: TAKE 1 TABLET BY MOUTH AT BEDTIME AS NEEDED FOR ANXIETY    Dispense:  30 tablet    Refill:  0    Not to exceed 5 additional fills before 07/13/2017

## 2017-02-11 NOTE — Telephone Encounter (Signed)
Please advise 

## 2017-02-18 ENCOUNTER — Ambulatory Visit (INDEPENDENT_AMBULATORY_CARE_PROVIDER_SITE_OTHER): Payer: 59 | Admitting: Internal Medicine

## 2017-02-18 ENCOUNTER — Encounter: Payer: Self-pay | Admitting: Internal Medicine

## 2017-02-18 VITALS — BP 129/88 | HR 96 | Ht 63.0 in | Wt 187.0 lb

## 2017-02-18 DIAGNOSIS — R002 Palpitations: Secondary | ICD-10-CM | POA: Diagnosis not present

## 2017-02-18 DIAGNOSIS — I472 Ventricular tachycardia, unspecified: Secondary | ICD-10-CM

## 2017-02-18 DIAGNOSIS — I493 Ventricular premature depolarization: Secondary | ICD-10-CM

## 2017-02-18 NOTE — Progress Notes (Signed)
HPI [name] returns today for followup. 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 She underwent catheter ablation over 14 years ago. Post procedure, she had residual palpitations due to PVCs and was placed on flecainide. She had done well and we ultimately stopped her flecainide and her PVC's have been mostly controlled. She has been bothered by her inability to lose weight.  Allergies  Allergen Reactions  . Covera-Hs [Verapamil]     Blood blisters, Stevens-Johnson syndrome  . Crestor [Rosuvastatin Calcium]     Flu like symptoms, on different statin  . Wellbutrin [Bupropion] Palpitations     Current Outpatient Medications  Medication Sig Dispense Refill  . ALPRAZolam (XANAX) 1 MG tablet TAKE 1 TABLET BY MOUTH AT BEDTIME AS NEEDED FOR ANXIETY 30 tablet 0  . atorvastatin (LIPITOR) 20 MG tablet TAKE 1 TABLET BY MOUTH EVERY DAY 90 tablet 3  . cyclobenzaprine (FLEXERIL) 10 MG tablet Take 10 mg by mouth at bedtime as needed.  3  . diclofenac (VOLTAREN) 75 MG EC tablet Take 75 mg by mouth daily as needed.    Marland Kitchen ipratropium (ATROVENT) 0.03 % nasal spray Place 2 sprays into both nostrils 2 (two) times daily. 30 mL 12  . Multiple Vitamin (MULTI-VITAMINS) TABS Take by mouth.    . pantoprazole (PROTONIX) 40 MG tablet TAKE 1 TABLET TWICE A DAY 180 tablet 0   Current Facility-Administered Medications  Medication Dose Route Frequency Provider Last Rate Last Dose  . 0.9 %  sodium chloride infusion  500 mL Intravenous Continuous Irene Shipper, MD         Past Medical History:  Diagnosis Date  . Anxiety   . Arrhythmia ventricular    s/p ablation 2001  . Carpal tunnel syndrome, bilateral   . Chronic RLQ pain   . Dyslipidemia   . Dysrhythmia    V- tach- resolved with ablation 2002  . GERD (gastroesophageal reflux disease)   . Headache   . Hiatal hernia   . History of migraine headaches    . IBS (irritable bowel syndrome)   . Insomnia   . Menorrhagia   . Mitral valve disorder   . MVP (mitral valve prolapse)   . Plantar fasciitis   . PONV (postoperative nausea and vomiting)   . Uterine fibroid    small  . Ventricular tachycardia ([place])     ROS:   All systems reviewed and negative except as noted in the HPI.   Past Surgical History:  Procedure Laterality Date  . ABDOMINAL HYSTERECTOMY    . BILATERAL SALPINGECTOMY Bilateral 12/07/2013   Procedure: BILATERAL SALPINGECTOMY;  Surgeon: Lovenia Kim, MD;  Location: [facility];  Service: Gynecology;  Laterality: Bilateral;  . CARDIAC ELECTROPHYSIOLOGY [place] AND ABLATION  2002  . cspine  05/1999   c5-6  . echocardiogram  2000/2001  . KNEE SURGERY  1998  . PATELLA ARTHROPLASTY    . ROBOTIC ASSISTED TOTAL HYSTERECTOMY N/A 12/07/2013   Procedure: ROBOTIC ASSISTED TOTAL HYSTERECTOMY;  Surgeon: Lovenia Kim, MD;  Location: [facility];  Service: Gynecology;  Laterality: N/A;  . SPINE SURGERY       Family History  Problem Relation Age of Onset  . Hiatal hernia Mother   . Irritable bowel syndrome Mother   . Hypertension Mother   . Thyroid disease Mother   . Heart disease Mother 32       AMI, 90% LAD,  stent placement, cardiac arrest  . Lung cancer Father        (+ TOBACCO)  . Hypertension Sister   . Colitis Maternal Grandmother        Crohns VS U.C  . Seizures Sister   . Headache Sister   . Pancreatic cancer Paternal Uncle   . Colon cancer Neg Hx      Social History   Socioeconomic History  . Marital status: Married    Spouse name: [name]  . Number of children: 2  . Years of education: 37  . Highest education level: Not on file  Social Needs  . Financial resource strain: Not on file  . Food insecurity - worry: Not on file  . Food insecurity - inability: Not on file  . Transportation needs - medical: Not on file  . Transportation needs - non-medical: Not on file  Occupational History  . Occupation: Scientist, water quality: [employer]   Tobacco Use  . Smoking status: Never Smoker  . Smokeless tobacco: Never Used  Substance and Sexual Activity  . Alcohol use: Yes    Alcohol/week: 3.0 oz    Types: 5 Glasses of wine per week    Comment: occ  . Drug use: No  . Sexual activity: Yes    Partners: Male    Birth control/protection: None    Comment: h/o infertility  Other Topics Concern  . Not on file  Social History Narrative   Patient is married [name]) lives with her husband (2nd marriage, [employer] Chief [employer]). Two daughters live with them.  Oldest daughter is grown and lives locally with her husband and children.   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   Patient is right-handed.   Patient drinks one cup of caffeine daily.     BP 129/88   Pulse 96   Ht 5\' 3"  (1.6 m)   Wt 187 lb (84.8 kg)   LMP 12/03/2014   BMI 33.13 kg/m   Physical Exam:  Well appearing NAD HEENT: Unremarkable Neck:  No JVD, no thyromegally Lymphatics:  No adenopathy Back:  No CVA tenderness Lungs:  Clear with no wheezes HEART:  Regular rate rhythm, no murmurs, no rubs, no clicks Abd:  soft, positive bowel sounds, no organomegally, no rebound, no guarding Ext:  2 plus pulses, no edema, no cyanosis, no clubbing Skin:  No rashes no nodules Neuro:  CN II through XII intact, motor grossly intact  EKG - NSR  Assess/Plan: 1. PVC's - her symptoms are mostly well controlled. I have asked her to reduce her caffeine and EtOH and to call if she worsening symptoms. No indication to restart her flecainide. 2. HTN - her blood pressure is up a little today. She admits to dietary indiscretion. I have strongly encouraged the patient to lose weight. 3. Obesity - she has gained over 30 lbs. She admits to not exercising and eating poorly. She will try to change her habits. 4. Insomnia - I have encouraged her to try and exercise  daily. Avoid caffeine.  [name].D.

## 2017-02-18 NOTE — Patient Instructions (Addendum)
Medication Instructions:  Your physician recommends that you continue on your current medications as directed. Please refer to the Current Medication list given to you today.  Labwork: None ordered.  Testing/Procedures: None ordered.  Follow-Up: Your physician wants you to follow-up in: 6 months with Dr. Lovena Le.   You will receive a reminder letter in the mail two months in advance. If you don't receive a letter, please call our office to schedule the follow-up appointment.  Any Other Special Instructions Will Be Listed Below (If Applicable).  Walk 3 miles a day 5 days a week.  If you need a refill on your cardiac medications before your next appointment, please call your pharmacy.

## 2017-02-25 ENCOUNTER — Other Ambulatory Visit: Payer: Self-pay | Admitting: Physician Assistant

## 2017-02-25 DIAGNOSIS — F418 Other specified anxiety disorders: Secondary | ICD-10-CM

## 2017-02-28 MED ORDER — ALPRAZOLAM 1 MG PO TABS: 1.0000 mg | ORAL_TABLET | Freq: Every evening | ORAL | 0 refills | Status: DC | PRN

## 2017-02-28 NOTE — Telephone Encounter (Signed)
Left message letting pt her Rx is ready for pick up at the front desk, per [facility] consent

## 2017-02-28 NOTE — Telephone Encounter (Signed)
Please advise. Pt states she is going out of town and would like to pick up at paper copy.

## 2017-02-28 NOTE — Telephone Encounter (Signed)
Rx printed, per her request.  Meds ordered this encounter  Medications  . ALPRAZolam (XANAX) 1 MG tablet    Sig: Take 1 tablet (1 mg total) by mouth at bedtime as needed for anxiety.    Dispense:  30 tablet    Refill:  0    Not to exceed 5 additional fills before 07/13/2017

## 2017-04-06 ENCOUNTER — Other Ambulatory Visit: Payer: Self-pay | Admitting: Physician Assistant

## 2017-04-06 DIAGNOSIS — F418 Other specified anxiety disorders: Secondary | ICD-10-CM

## 2017-04-07 ENCOUNTER — Ambulatory Visit (INDEPENDENT_AMBULATORY_CARE_PROVIDER_SITE_OTHER): Payer: 59 | Admitting: Internal Medicine

## 2017-04-07 ENCOUNTER — Encounter: Payer: Self-pay | Admitting: Internal Medicine

## 2017-04-07 VITALS — BP 122/80 | HR 63 | Ht 63.0 in | Wt 190.8 lb

## 2017-04-07 DIAGNOSIS — R1013 Epigastric pain: Secondary | ICD-10-CM

## 2017-04-07 DIAGNOSIS — K219 Gastro-esophageal reflux disease without esophagitis: Secondary | ICD-10-CM | POA: Diagnosis not present

## 2017-04-07 MED ORDER — ALPRAZOLAM 1 MG PO TABS: 1.0000 mg | ORAL_TABLET | Freq: Every evening | ORAL | 0 refills | Status: DC | PRN

## 2017-04-07 NOTE — Patient Instructions (Signed)
Continue taking your PPI and follow up in one year

## 2017-04-07 NOTE — Addendum Note (Signed)
Addended by: Fara Chute on: 04/07/2017 07:11 PM   Modules accepted: Orders

## 2017-04-07 NOTE — Progress Notes (Signed)
HISTORY OF PRESENT ILLNESS:  [name] is a 52 y.o. female with past medical history as listed below who was evaluated in this office 09/25/2015 regarding GERD, mild dysphagia, and colon cancer screening. See that dictation for details. She subsequently underwent screening colonoscopy and diagnostic upper endoscopy 11/06/2015. Complete colonoscopy was normal. Follow-up in 10 years recommended. Upper endoscopy was normal. She was advised with regards to reflux precautions and told to continue on her PPI which was effective. She presents today for routine follow-up. Blood work July 2018 has been reviewed and is unremarkable. She tells me that she has decreased her pantoprazole to 40 mg daily. On this dose is good control of reflux symptoms. No recurrent problems with cough (for which she saw ENT previously). She does have a new complaint of postprandial epigastric fullness or discomfort. This has been going on for approximately one week since she initiated antibiotic therapy for respiratory illness. No nausea or vomiting. The pain does not radiate. May last or be uncomfortable for several hours. GI review of systems is otherwise negative.  REVIEW OF SYSTEMS:  All non-GI ROS negative unless otherwise stated in the history of present illness except for headaches  Past Medical History:  Diagnosis Date  . Anxiety   . Arrhythmia ventricular    s/p ablation 2001  . Carpal tunnel syndrome, bilateral   . Chronic RLQ pain   . Dyslipidemia   . Dysrhythmia    V- tach- resolved with ablation 2002  . GERD (gastroesophageal reflux disease)   . Headache   . Hiatal hernia   . History of migraine headaches   . IBS (irritable bowel syndrome)   . Insomnia   . Menorrhagia   . Mitral valve disorder   . MVP (mitral valve prolapse)   . Plantar fasciitis   . PONV (postoperative nausea and vomiting)   . Uterine fibroid    small  . Ventricular tachycardia [facility])     Past Surgical History:   Procedure Laterality Date  . ABDOMINAL HYSTERECTOMY    . BILATERAL SALPINGECTOMY Bilateral 12/07/2013   Procedure: BILATERAL SALPINGECTOMY;  Surgeon: Lovenia Kim, MD;  Location: [facility];  Service: Gynecology;  Laterality: Bilateral;  . CARDIAC ELECTROPHYSIOLOGY [place] AND ABLATION  2002  . cspine  05/1999   c5-6  . echocardiogram  2000/2001  . KNEE SURGERY  1998  . PATELLA ARTHROPLASTY    . ROBOTIC ASSISTED TOTAL HYSTERECTOMY N/A 12/07/2013   Procedure: ROBOTIC ASSISTED TOTAL HYSTERECTOMY;  Surgeon: Lovenia Kim, MD;  Location: [facility];  Service: Gynecology;  Laterality: N/A;  . [place]      Social History [name]  reports that she has never smoked. She has never used smokeless tobacco. She reports that she drinks about 3.0 oz of alcohol per week. She reports that she does not use drugs.  family history includes Colitis in her maternal grandmother; Headache in her sister; Heart disease (age of onset: 48) in her mother; Hiatal hernia in her mother; Hypertension in her mother and sister; Irritable bowel syndrome in her mother; Lung cancer in her father; Pancreatic cancer in her paternal uncle; Seizures in her sister; Thyroid disease in her mother.  Allergies  Allergen Reactions  . Covera-Hs [Verapamil]     Blood blisters, Stevens-Johnson syndrome  . Crestor [Rosuvastatin Calcium]     Flu like symptoms, on different statin  . Wellbutrin [Bupropion] Palpitations       PHYSICAL EXAMINATION: Vital signs: BP 122/80   Pulse 63  Ht 5\' 3"  (1.6 m)   Wt 190 lb 12.8 oz (86.5 kg)   LMP 12/03/2014   SpO2 98%   BMI 33.80 kg/m   Constitutional: pleasant,generally well-appearing, no acute distress Psychiatric: alert and oriented x3, cooperative Eyes: extraocular movements intact, anicteric, conjunctiva pink Mouth: oral pharynx moist, no lesions Neck: supple no lymphadenopathy Cardiovascular: heart regular rate and rhythm, no murmur Lungs: clear to  auscultation bilaterally Abdomen: soft, nontender, nondistended, no obvious ascites, no peritoneal signs, normal bowel sounds, no organomegaly Rectal:omitted Extremities: no clubbing cyanosis or lower extremity edema bilaterally Skin: no lesions on visible extremities Neuro: No focal deficits. Cranial nerves intact  ASSESSMENT:  #1. GERD. Prior history of mild esophagitis on previous EGD. Last EGD normal. Asymptomatic on pantoprazole 40 mg daily #2. Epigastric discomfort. Question related to new medication. Unlikely acid peptic disorder. Could be gallbladder could not overwhelming #3. Normal screening colonoscopy October 2017   PLAN:  #1. Reflux precautions #2. Continue pantoprazole 40 mg daily #3. We discussed PPI safety issues based on available data #4. If epigastric discomfort persists after finishing up antibiotic therapy I have asked the patient to contact the office. We will schedule her for abdominal ultrasound to further evaluate. Otherwise, routine follow-up one year #5. Screening colonoscopy due around 2027

## 2017-04-13 ENCOUNTER — Encounter: Payer: Self-pay | Admitting: Physician Assistant

## 2017-05-03 ENCOUNTER — Other Ambulatory Visit: Payer: Self-pay | Admitting: Internal Medicine

## 2017-05-04 ENCOUNTER — Other Ambulatory Visit: Payer: Self-pay | Admitting: Physician Assistant

## 2017-05-04 DIAGNOSIS — F418 Other specified anxiety disorders: Secondary | ICD-10-CM

## 2017-05-05 ENCOUNTER — Other Ambulatory Visit: Payer: Self-pay | Admitting: Physician Assistant

## 2017-05-05 DIAGNOSIS — F418 Other specified anxiety disorders: Secondary | ICD-10-CM

## 2017-05-05 NOTE — Telephone Encounter (Signed)
Duplicate medication, see mychart encounter.

## 2017-05-05 NOTE — Telephone Encounter (Signed)
Meds ordered this encounter  Medications  . ALPRAZolam (XANAX) 1 MG tablet    Sig: TAKE 1 TABLET (1 MG TOTAL) BY MOUTH AT BEDTIME AS NEEDED FOR ANXIETY.    Dispense:  30 tablet    Refill:  0    Not to exceed 5 additional fills before 10/04/2017

## 2017-05-05 NOTE — Telephone Encounter (Signed)
Patient is requesting a refill of the following medications: Requested Prescriptions   Pending Prescriptions Disp Refills  . ALPRAZolam (XANAX) 1 MG tablet [Pharmacy Med Name: ALPRAZOLAM 1 MG TABLET] 30 tablet 0    Sig: TAKE 1 TABLET (1 MG TOTAL) BY MOUTH AT BEDTIME AS NEEDED FOR ANXIETY.    Date of patient request: 05/04/17 Last office visit: 10/28/2016 Date of last refill: 04/07/2017 Last refill amount: #30, no refills Follow up time period per chart: Return for re-evaluation as previously planned

## 2017-05-05 NOTE — Telephone Encounter (Signed)
Duplicate request

## 2017-06-01 ENCOUNTER — Other Ambulatory Visit: Payer: Self-pay | Admitting: Physician Assistant

## 2017-06-01 ENCOUNTER — Encounter: Payer: Self-pay | Admitting: Family Medicine

## 2017-06-01 DIAGNOSIS — F418 Other specified anxiety disorders: Secondary | ICD-10-CM

## 2017-06-02 NOTE — Telephone Encounter (Signed)
Request for Xanax refill Last OV: 10/28/16 Last Refill:05/05/17 #30 Pharmacy:[facility] on [address]

## 2017-06-02 NOTE — Telephone Encounter (Signed)
Patient is requesting a refill of the following medications: Requested Prescriptions   Pending Prescriptions Disp Refills  . ALPRAZolam (XANAX) 1 MG tablet [Pharmacy Med Name: ALPRAZOLAM 1 MG TABLET] 30 tablet 0    Sig: TAKE 1 TABLET (1 MG TOTAL) BY MOUTH AT BEDTIME AS NEEDED FOR ANXIETY.    Date of patient request:06/01/17  Last office visit: 10/28/17 Date of last refill: 05/05/17 Last refill amount: #30 0RF Follow up time period per chart: none scheduled at this time

## 2017-06-02 NOTE — Telephone Encounter (Signed)
Patient is requesting a refill of the following medications: Requested Prescriptions   Pending Prescriptions Disp Refills  . ALPRAZolam (XANAX) 1 MG tablet 30 tablet 0    Sig: Take 1 tablet (1 mg total) by mouth at bedtime as needed for anxiety.    Date of patient request: 06/01/17 Last office visit: 10/28/17 Date of last refill: 05/05/17 Last refill amount: 30 0RF Follow up time period per chart: none scheduled at this time

## 2017-06-03 MED ORDER — ALPRAZOLAM 1 MG PO TABS: 1.0000 mg | ORAL_TABLET | Freq: Every evening | ORAL | 0 refills | Status: DC | PRN

## 2017-06-03 NOTE — Telephone Encounter (Signed)
Rxs sent. Patient notified via My Chart.

## 2017-09-30 IMAGING — MG MM DIAG BREAST W/IMPLANT TOMO BILATERAL
8 of 16 series · 8 of 32 positions shown · non-contrast
Comparison: Previous exam(s).

CLINICAL DATA: Followup for an area of asymmetry in the left
breast. Patient also has right breast calcifications. These were
originally noted on 08/03/2012. She has undergone short-term
follow-up since.

EXAM:
2D DIGITAL DIAGNOSTIC BILATERAL MAMMOGRAM WITH IMPLANTS, CAD AND
ADJUNCT TOMO
The patient has retropectoral implants. Standard and implant
displaced views were performed.

[R CC]
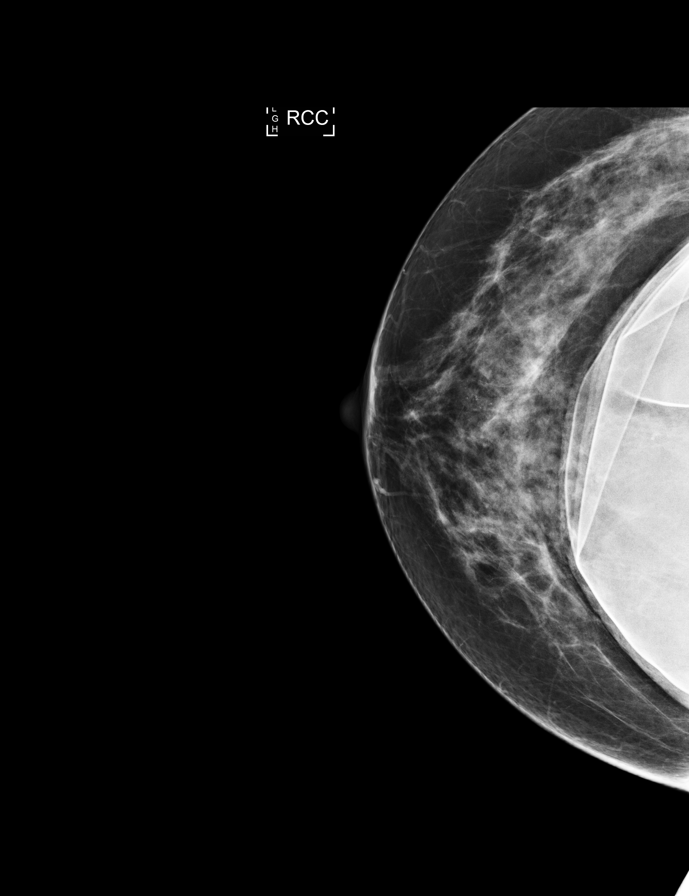

[L MLO (1 of 2)]
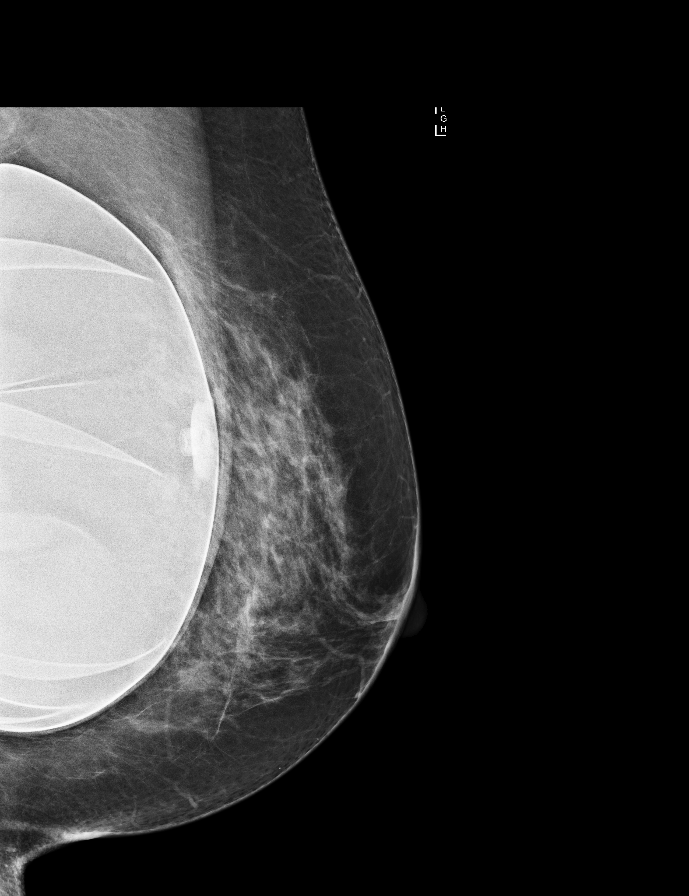

[R MLO]
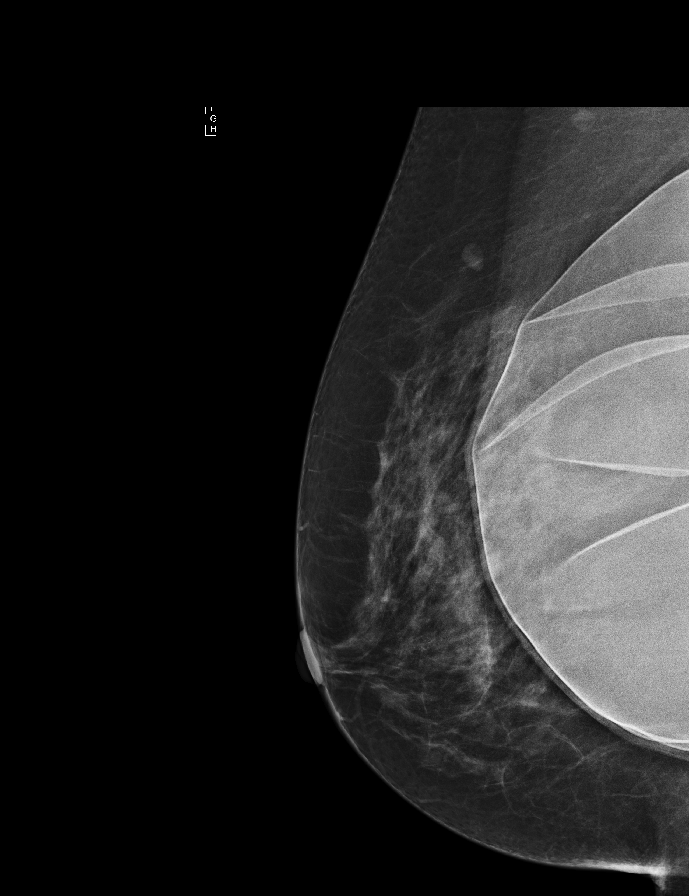

[L CC (1 of 2)]
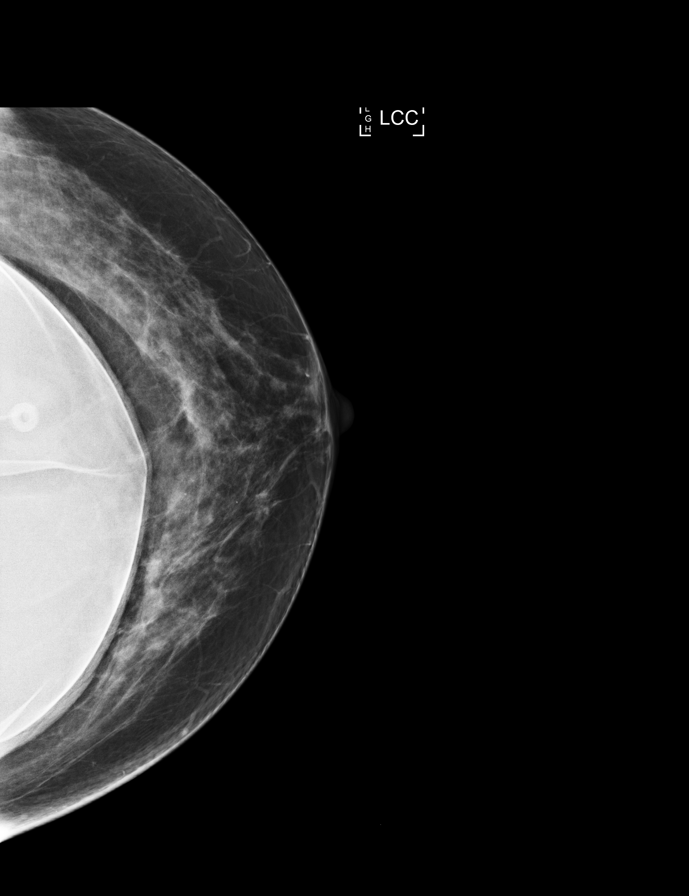

[L CC (2 of 2)]
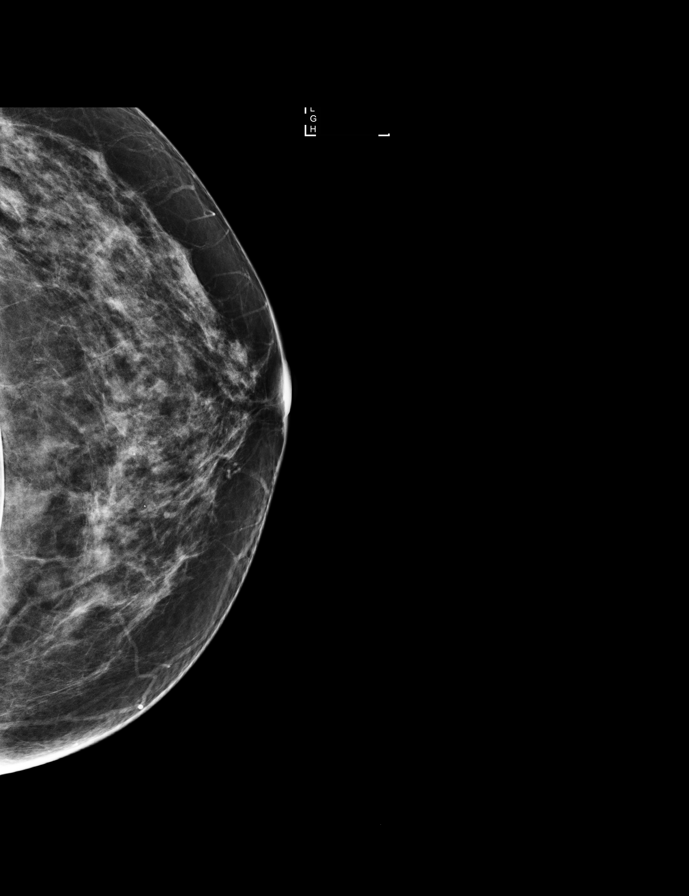

[L MLO synth-2D]
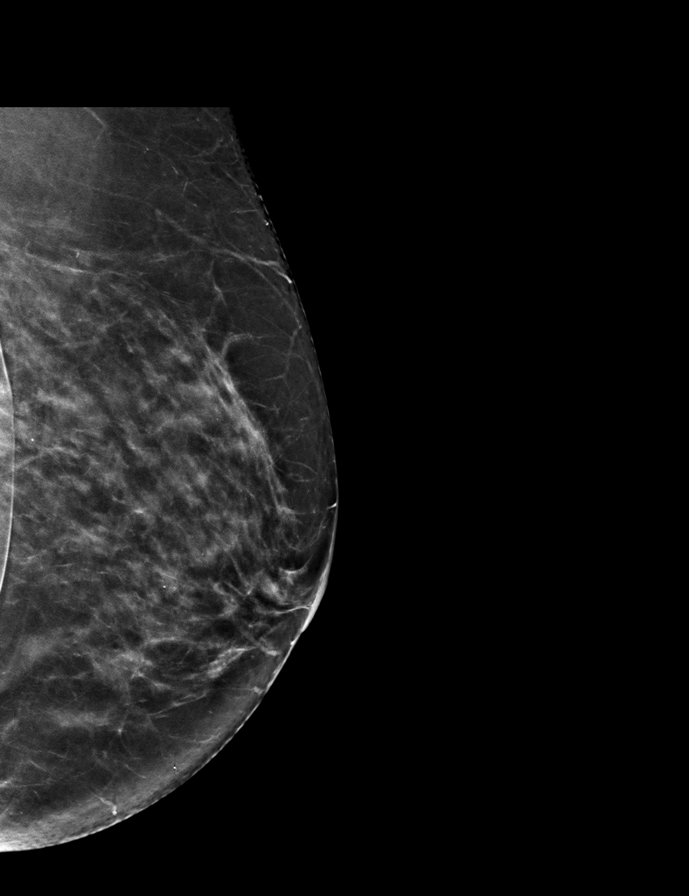

[L CC synth-2D]
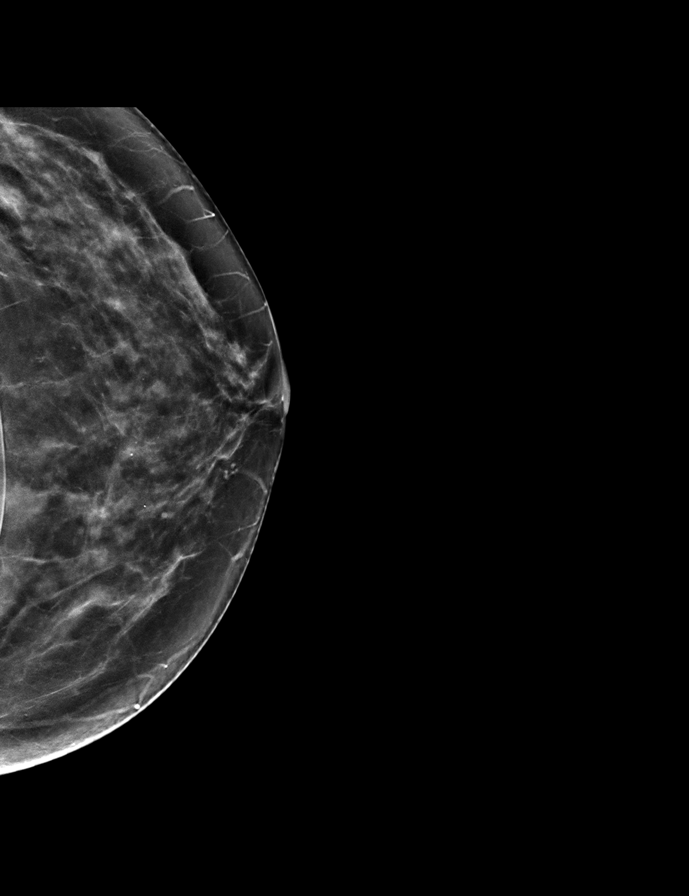

[L MLO (2 of 2)]
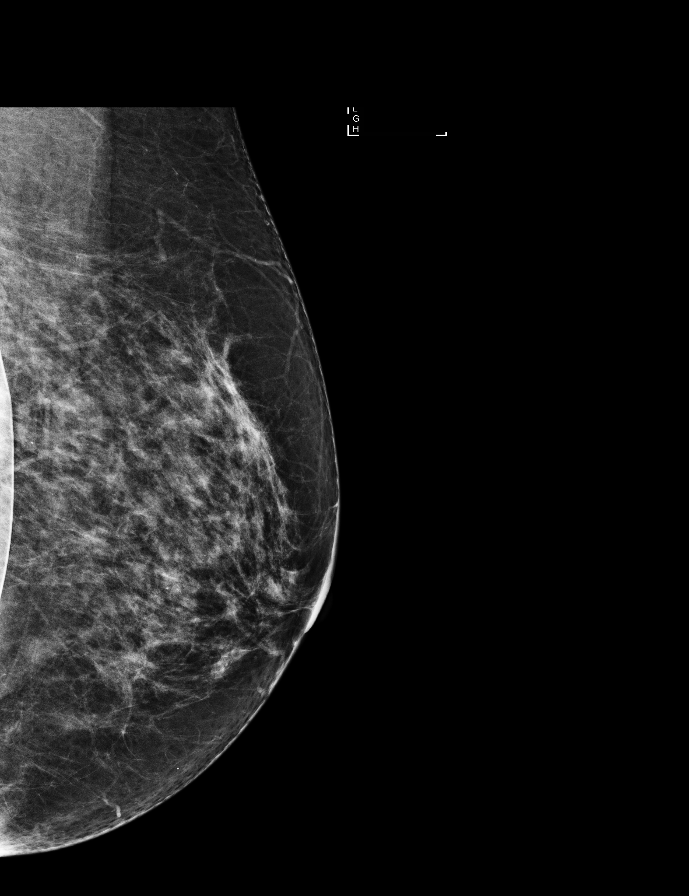

[8 of 32 positions shown; findings below may reference images not displayed]

ACR Breast Density Category c: The breast tissue is heterogeneously
dense, which may obscure small masses.
FINDINGS: The area of asymmetry in the posterior medial aspect of the left
breast on the implant displacement CC view is unchanged consistent
with normal fibroglandular tissue.

Groups of calcifications in the right breast are stable. There are
no new calcifications.

There are no discrete masses or areas of architectural distortion.

No mammographic change.

Mammographic images were processed with CAD.
IMPRESSION: No evidence of malignancy.  Benign breast calcifications.

RECOMMENDATION:
Screening mammogram in one year.(Code:LY-V-LCI)

I have discussed the findings and recommendations with the patient.
Results were also provided in writing at the conclusion of the
visit. If applicable, a reminder letter will be sent to the patient
regarding the next appointment.

BI-RADS CATEGORY  2: Benign.

## 2018-01-27 DIAGNOSIS — Z0271 Encounter for disability determination: Secondary | ICD-10-CM

## 2018-03-04 ENCOUNTER — Telehealth: Payer: Self-pay | Admitting: Internal Medicine

## 2018-03-04 NOTE — Telephone Encounter (Signed)
Left message to explain how to become a new patient with our office.

## 2018-03-16 ENCOUNTER — Encounter: Payer: Self-pay | Admitting: Neurology

## 2018-03-18 ENCOUNTER — Ambulatory Visit (INDEPENDENT_AMBULATORY_CARE_PROVIDER_SITE_OTHER): Payer: 59 | Admitting: Neurology

## 2018-03-18 ENCOUNTER — Encounter: Payer: Self-pay | Admitting: Neurology

## 2018-03-18 ENCOUNTER — Institutional Professional Consult (permissible substitution): Payer: 59 | Admitting: Neurology

## 2018-03-18 ENCOUNTER — Other Ambulatory Visit: Payer: Self-pay

## 2018-03-18 VITALS — BP 116/68 | HR 78 | Resp 16 | Ht 63.0 in | Wt 190.0 lb

## 2018-03-18 DIAGNOSIS — G8929 Other chronic pain: Secondary | ICD-10-CM

## 2018-03-18 DIAGNOSIS — G4733 Obstructive sleep apnea (adult) (pediatric): Secondary | ICD-10-CM

## 2018-03-18 DIAGNOSIS — I059 Rheumatic mitral valve disease, unspecified: Secondary | ICD-10-CM

## 2018-03-18 DIAGNOSIS — Z9989 Dependence on other enabling machines and devices: Secondary | ICD-10-CM

## 2018-03-18 DIAGNOSIS — I4729 Other ventricular tachycardia: Secondary | ICD-10-CM

## 2018-03-18 DIAGNOSIS — I472 Ventricular tachycardia: Secondary | ICD-10-CM

## 2018-03-18 DIAGNOSIS — R5382 Chronic fatigue, unspecified: Secondary | ICD-10-CM

## 2018-03-18 DIAGNOSIS — G4701 Insomnia due to medical condition: Secondary | ICD-10-CM

## 2018-03-18 NOTE — Progress Notes (Signed)
SLEEP MEDICINE CLINIC   Provider:  Larey Seat, MD   Primary Care Physician:  Harrison Mons, [place]   Referring Provider: Harrison Mons, PA    Chief Complaint  Patient presents with   Sleep Apnea    Rm. 10. Pt. here to establish care for tx. of OSA. Sts. she is currently using CPAP, O2. Currenlty being treated by [facility]    HPI:  [name] is a 53 y.o. female who used to be seen for Migraines, and wants now to be seen for sleep apnea. She had a HST through [facility], and was prescribed CPAP. She frequently wakes still up, she had through [facility] been scheduled for ONO. Apparently abnormal result, as she was furbished with an oxygen concentrator.  She is followed by Dr. Coletta Memos and Quay Burow, [place].  Carried a diagnosis of insomnia, chronic and migraine, fibromyalgia, and now OSA.  She had the following tests- I am not privy to the data, but the 05-2017 HST documented an AHI of 30.1/ h, )2 Nadir was 78%. Length of desaturation was 16 seconds.  She was placed on autotitration- 5 through 15 cm water , 3 cm EPR - AHI of 1.2 /h and ONO July 2019, 21 minutes total desat. Time, desaturation 9/h.   CPAP compliance - 97 % , Mrs. have to [facility] compliance report shows 97% compliance for over 4 hours of daily use with an average 8 hours 57 minutes of use.  The AutoSet provides pressure between 5 and 15 cmH2O the 3 cm expiratory pressure relief.  She does not have central apneas emerging her residual AHI is 1.2 which is an excellent resolution of obstructive sleep apnea she has minimal air leaks, the 95th percentile pressure is 8.4 cmH2O.    We could discuss reducing the pressure, but since this is an auto titration device she will not be exposed to higher pressures in her body demands.  Using CPAP has benefited the patient's headache marginally-still has a HA frequency of almost daily. She wakes up often with headaches however in the middle of the night.   Chief complaint according to  patient : "Insomnia, sleeping 1-2 hours, can barely function." I am fatigued but not sleepy". Sleep perception disorder?   Sleep habits are as follows:  Dinnertime for the patient is usually 6 PM, she will spend the evening with relaxing activities sometimes a walk sometimes TV, sometimes reading.  Bedtime will be around midnight, the bedroom is cool, quiet and dark.  She shares a bedroom with her husband who has witnessed her apneas and snoring before she was treated with CPAP.  Mostly she sleeps on her sides but since she is using CPAP she has begun more sleeping in supine as not to dislodge the mask. She likes the dream wear mask. Sleeps on 2 pillows. Most evenings her sleep latency exceeds an hour, and once she is asleep she feels that she only can sleep 2 to 3 hours before waking up again.  She suspects that discomfort is part of this since her apnea is treated. Any arousal from sleep leads to a prolonged, protracted period of wakefulness unable to return to sleep. If she is lucky if she feels that she may get another 2 hours of sleep after that.  It is her estimate that she only sleeps about 4 hours at night and that the high compliance of CPAP use of 8 hours 57 minutes consist mostly of being wakeful with his CPAP in place.  She  has to rise in the morning at 8:30 AM, she can work from home.  Sleep medical history: V tach status posy cardiac ablation, fusion in 2002, cervical spine 5-6-7.  After her hysterectomy in the year 2015 she had difficulties starting to breathe on her own after extubation and to breathe deep enough, presenting with hypopnea and hypoxemia. Migraine, Neck and shoulder pain patient who was given the diagnosis of fibromyalgia at the [facility] in [place].   Family sleep history: insomnia - father. Brother, sister snore loudly as does mother.   Social history: Married, 40 and 74 year old children. She may have 4 or 5 glasses of wine with dinner per week at the most, she  is a non-smoker nontobacco user, caffeine use 1 cup of coffee in the morning not every morning.  No sodas, no ice tea or hot tea.   Review of Systems:  Heat and cold sensitive, flushing, feeling the windpipe is tight.  Out of a complete 14 system review, the patient complains of only the following symptoms, and all other reviewed systems are negative.  Anxiety, pain, aches, and medication resistant insomnia.   Epworth score only endorsed at 2 points , Fatigue severity score: 53/ 63 points.    " I didn't snore before my anterior cervical fusion"    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[employer] resource strain: Not on file   Food insecurity:    Worry: Not on file    Inability: Not on file   Transportation needs:    Medical: Not on file    Non-medical: Not on file  Tobacco Use   Smoking status: Never Smoker   Smokeless tobacco: Never Used  Substance and Sexual Activity   Alcohol use: Yes    Alcohol/week: 5.0 standard drinks    Types: 5 Glasses of wine per week    Comment: occ   Drug use: No   Sexual activity: Yes    Partners: Male    Birth control/protection: None    Comment: h/o infertility  Lifestyle   Physical activity:    Days per week: Not on file    Minutes per session: Not on file   Stress: Not on file  Relationships   Social connections:    Talks on phone: Not on file    Gets together: Not on file    Attends religious service: Not on file    Active member of club or organization: Not on file    Attends meetings of clubs or organizations: Not on file    Relationship status: Not on file   Intimate partner violence:    Fear of current or ex partner: Not on file     Emotionally abused: Not on file    Physically abused: Not on file    Forced sexual activity: Not on file  Other Topics Concern   Not on file  Social History Narrative   Patient is married [name]) lives with her husband (2nd marriage, [employer]). Two daughters live with them.  Oldest daughter is grown and lives locally with her husband and children.   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   Patient is right-handed.   Patient drinks one cup of caffeine daily.    Family History  Problem Relation Age of Onset   Hiatal hernia Mother    Irritable bowel syndrome Mother    Hypertension Mother    Thyroid disease Mother    Heart disease Mother 74       AMI, 90% LAD, stent placement, cardiac arrest   Lung cancer Father        (+ TOBACCO)   Hypertension Sister    Colitis Maternal Grandmother        Crohns VS U.C   Seizures Sister    Headache Sister    Pancreatic cancer Paternal Uncle    Colon cancer Neg Hx     Past Medical History:  Diagnosis Date   Anxiety    Arrhythmia ventricular    s/p ablation 2001   Carpal tunnel syndrome, bilateral    Chronic RLQ pain    Dyslipidemia    Dysrhythmia    V- tach- resolved with ablation 2002   GERD (gastroesophageal reflux disease)    Headache    Hiatal hernia    History of migraine headaches    IBS (irritable bowel syndrome)    Insomnia    Menorrhagia    Mitral valve disorder    MVP (mitral valve prolapse)    Plantar fasciitis    PONV (postoperative nausea and vomiting)    Uterine fibroid    small   Ventricular tachycardia ([place])     Past Surgical History:  Procedure Laterality Date   ABDOMINAL HYSTERECTOMY     BILATERAL SALPINGECTOMY Bilateral 12/07/2013   Procedure: BILATERAL SALPINGECTOMY;  Surgeon: Lovenia Kim, MD;  Location: [facility];  Service: Gynecology;  Laterality: Bilateral;   CARDIAC ELECTROPHYSIOLOGY  MAPPING AND ABLATION  2002   cspine  05/1999   c5-6   echocardiogram  2000/2001   KNEE SURGERY  1998   PATELLA ARTHROPLASTY     ROBOTIC ASSISTED TOTAL HYSTERECTOMY N/A 12/07/2013   Procedure: ROBOTIC ASSISTED TOTAL HYSTERECTOMY;  Surgeon: Lovenia Kim, MD;  Location: [facility];  Service: Gynecology;  Laterality: N/A;   SPINE SURGERY      Current Outpatient Medications  Medication Sig Dispense Refill   ALPRAZolam (XANAX) 1 MG tablet TAKE 1 TABLET (1 MG TOTAL) BY MOUTH AT BEDTIME AS NEEDED FOR ANXIETY. 30 tablet 0   ALPRAZolam (XANAX) 1 MG tablet Take 1 tablet (1 mg total) by mouth at bedtime as needed for anxiety. 30 tablet 0   ALPRAZolam (XANAX) 1 MG tablet Take 1 tablet (1 mg total) by mouth at bedtime as needed for anxiety. 30 tablet 0   atorvastatin (LIPITOR) 20 MG tablet TAKE 1 TABLET BY MOUTH EVERY DAY 90 tablet 3   cyclobenzaprine (FLEXERIL) 10 MG tablet Take 10 mg by mouth at bedtime as needed.  3   ipratropium (ATROVENT) 0.03 % nasal spray Place 2 sprays into both nostrils 2 (two) times daily. 30 mL 12   Multiple Vitamin (MULTI-VITAMINS) TABS Take by mouth.     pantoprazole (PROTONIX) 40 MG tablet TAKE 1 TABLET TWICE A DAY (NEED OFFICE VISIT FOR FURTHER REFILLS) 180 tablet 3   Current Facility-Administered Medications  Medication Dose Route Frequency Provider Last Rate Last Dose   0.9 %  sodium chloride infusion  500 mL Intravenous Continuous Irene Shipper, MD        Allergies as of 03/18/2018 - Review Complete 03/18/2018  Allergen Reaction  Noted   Covera-hs [verapamil]  05/06/2007   Crestor [rosuvastatin calcium]  10/15/2010   Wellbutrin [bupropion] Palpitations 01/02/2015    Vitals: BP 116/68    Pulse 78    Resp 16    Ht 5\' 3"  (1.6 m)    Wt 190 lb (86.2 kg)    LMP 12/03/2014    BMI 33.66 kg/m  Last Weight:  Wt Readings from Last 1 Encounters:  03/18/18 190 lb (86.2 kg)   ZCH:YIFO mass index is 33.66 kg/m.     Last Height:   Ht Readings from Last 1  Encounters:  03/18/18 5\' 3"  (1.6 m)    Physical exam:  General: The patient is awake, alert and appears not in acute distress. The patient is well groomed. Head: Normocephalic, atraumatic.  Neck is supple. Mallampati  2-3 , not very tight.  neck circumference:15. 25" . Nasal airflow seasonal affected , rhinitis. Mild Retrognathia.  Cardiovascular:  Regular rate and rhythm, without  murmurs or carotid bruit, and without distended neck veins. Respiratory: Lungs are clear to auscultation. Skin:  Without evidence of edema, or rash Trunk: BMI is elevated at 33 kg/m2 . The patient's posture is stooped.   Neurologic exam : The patient is awake and alert, oriented to place and time.    Attention span & concentration ability appears normal.  Speech is fluent,  without dysarthria, dysphonia or aphasia.  Mood and affect are appropriate.  Cranial nerves: Pupils are equal and briskly reactive to light. Funduscopic exam without  evidence of pallor or edema. Extraocular movements  in vertical and horizontal planes intact and without nystagmus. Visual fields by finger perimetry are intact. Hearing to finger rub intact. Intact sensation intact to fine touch. Facial motor strength is symmetric and tongue and uvula move midline. Shoulder shrug was symmetrical.   Motor exam:  Normal tone, muscle bulk and symmetric strength in all extremities.  Sensory:  Fine touch, pinprick and vibration were tested in all extremities. The sense of vibration is decreased in her ankles- without edema. Knee and elbow are sensitive to vibration.  Proprioception tested in the upper extremities was normal.  Coordination: Rapid alternating movements in the fingers/hands was normal. Finger-to-nose maneuver  normal without evidence of ataxia, dysmetria or tremor.  Gait and station: Patient walks without assistive device and is able unassisted to walk, no need for bracing.  Strength within normal limits.  Stance is stable and  normal.  Turns with  3 Steps.  Deep tendon reflexes: in the upper and lower extremities are symmetrically present / intact. Babinski maneuver response is  downgoing.   Assessment:  After physical and neurologic examination, review of laboratory studies,  Personal review of imaging studies, reports of other /same  Imaging studies, results of polysomnography and / or neurophysiology testing and pre-existing records as far as provided in visit., my assessment is:  As far as I can reconstruct from Mrs. Vantrease records of which she provided many, she was diagnosed by a home sleep test with presumably obstructive sleep apnea at an AHI of 30/h.     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  She had no significant hypoxemia if the quotations from the phone note in my chart messages are correct, and nadir of 76 or 78% oxygen means nothing as it can be artifactual.  A medical significant meaning arises from the duration  of the desaturation and in her case these first seconds, not really of oxygen supplementation.  It would be different if she had a total sleep time with over 60 minutes of oxygen desaturations and a desaturation index that exceeded her apnea hypopnea index.  I feel the same way that she will verify the data she should be observed sleeping in the sleep lab which causes her some anguish as she has such trouble sleeping at all.  Consider that she has already a sleep latency of estimated 1 hour I would like for her to come as a late arrival so she does not have to spend hours in bed waiting for sleep to happen.   There may also be a misperception of sleep as many patients feel that could not possibly have slept more than 2 hours/h EEG recordings may have traced 5 or 6 hours of sleep.  At least this would help to qualify the findings and streamline therapies as far as necessary.  Since his  snoring began after cervical spinal fusion I do think that there may be a postoperative anatomical change, and it may well with looking at a neck MRI rather than just the neck spine MRI.  The patient was advised of the nature of the diagnosed disorder , the treatment options and the  risks for general health and wellness arising from not treating the condition.   I spent more than 55 minutes of face to face time with the patient.  Greater than 50% of time was spent in counseling and coordination of care. We have discussed the diagnosis and differential and I answered the patient's questions.    Plan:  Treatment plan and additional workup : Attended sleep study- I want the patient to use CPAP and her interface. Can we arrange for one hour without CPAP and in any case , if sleepy or not, start CPAP at midnight?  This study will be ordered as a CPAP retitration.   RV after PSG- She can use oxygen until then- I ma unsure I could ever qualify her for o2 based on current AASM criteria.   Larey Seat, MD 06/29/7626, 3:15 PM  Certified in Neurology by ABPN Certified in [place] by [facility] [address], [place], [place] [address]

## 2018-04-05 ENCOUNTER — Institutional Professional Consult (permissible substitution): Payer: 59 | Admitting: Neurology

## 2018-04-21 ENCOUNTER — Telehealth: Payer: Self-pay

## 2018-04-21 NOTE — Telephone Encounter (Signed)
Pt is a pt is a [name], forms were faxed to that office for pt disability determination.

## 2018-05-05 DIAGNOSIS — Z0289 Encounter for other administrative examinations: Secondary | ICD-10-CM

## 2018-06-02 ENCOUNTER — Telehealth: Payer: Self-pay | Admitting: Neurology

## 2018-06-02 DIAGNOSIS — I4729 Other ventricular tachycardia: Secondary | ICD-10-CM

## 2018-06-02 DIAGNOSIS — G4733 Obstructive sleep apnea (adult) (pediatric): Secondary | ICD-10-CM

## 2018-06-02 DIAGNOSIS — I472 Ventricular tachycardia: Secondary | ICD-10-CM

## 2018-06-02 DIAGNOSIS — R5382 Chronic fatigue, unspecified: Secondary | ICD-10-CM

## 2018-06-02 NOTE — Telephone Encounter (Signed)
She is still in our CPAP folder to be called once we know we can schedule them again

## 2018-06-02 NOTE — Telephone Encounter (Signed)
I called and left a message because the patient didn't answer. I informed her that it was not mentioned in her progress note about completing a MRI. The only thing I saw was this part in previous note  "Since his snoring began after cervical spinal fusion I do think that there may be a postoperative anatomical change, and it may well with looking at a neck MRI rather than just the neck spine MRI."  Again in the treatment plan it only mentioned completing a CPAP titration Study. At this time our sleep lab has not opened up and has been closed due to Covid 19 to completing the CPAP titration studies. Informed the patient I would question if a MRI should be ordered for the patient with Dr. Brett Fairy upon her return to work next week as well as make sure she is on the list with the sleep lab to have CPAP titration completed once we open that back up. Informed the patient to call back with any questions.

## 2018-06-02 NOTE — Telephone Encounter (Signed)
Pt states she was told by Dr Brett Fairy that a MRI would be ordered for her. Phone rep reached out to MRI coordinator and was told there is no MRI order in the system for her.  Pt is asking that this be looked into. Pt states she was told a sleep study and a MRI would be ordered for her.

## 2018-06-03 NOTE — Addendum Note (Signed)
Addended by: Larey Seat on: 06/03/2018 12:02 PM   Modules accepted: Orders

## 2018-06-03 NOTE — Telephone Encounter (Signed)
Hi, [name]  I entertained the MRI neck, not c- spine, but would rather have ENT look at her airflow- they can do an upper airway endoscopy and should see if postsurgical scar tissue has an effect on windpipe, if there is scarring or a diverticle.  Her cervical fusion was done outside the [facility] system, I believe, as was her last sleep study. Through "[facility]" . GI has looked at acid reflux playing a role. Had been controlled on Protonix.  I will place an ENT referral  Unless the patient objects- will you clarify?  Larey Seat, MD

## 2018-06-07 NOTE — Telephone Encounter (Addendum)
Spoke with patient, she had already spine-MRIs about 3 years ago- reports she had ENT evaluation with upper endoscopy- normal except hiatal hernia. . -  but her Fibromyalgia specialist ( Dr Jeral Fruit, [place] in [place]  recommended a positional MRI- ? I will have to research this, have never been confronted with this type of image, found a list Under images covered by [employer], apparently covered  sicne 05-2017 . I am now looking for where is this done. CD

## 2018-06-07 NOTE — Telephone Encounter (Signed)
Patient is not keen on the idea of ENT referral. She states that she has recently had endoscopy that has already been done. She states that with her pain that she is having in the neck area and the numbness and  Pt is requesting to complete the MRI if possible and She states specifically it was discussed completing a positional MRI since she has had MRI's previously in the past. She just was confused all together with the ENT referral and why they would be needed. She is on the list to have CPAP titration when the sleep lab opens back up. Advised the patient that I will have to discuss her concerns. Patient is asking if Dr Brett Fairy could call her that would be helpful as well. Informed her I will let her know if there is something she recommends or Dr Brett Fairy will call and discuss further.

## 2018-06-07 NOTE — Telephone Encounter (Signed)
Called the patient back to discuss. There was no answer. LVM for the patient to call back.

## 2018-06-08 ENCOUNTER — Other Ambulatory Visit: Payer: Self-pay | Admitting: Neurology

## 2018-06-08 NOTE — Telephone Encounter (Signed)
The only place that offers the multi-positional MRI is in [place] office in [place]. This is at [facility]. Again not sure if insurance will approve the imaging. Patient can contact her insurance and check on that status. I have printed the appropriate order form and have the number to call and schedule the patient.

## 2018-06-08 NOTE — Telephone Encounter (Signed)
I printed info about this type of MRI, can we find out if it's done here by anyone?

## 2018-06-09 NOTE — Telephone Encounter (Signed)
Called the patient and made her aware of the findings. She is willing to complete this through [place], [place]. Informed her of the scheduling number and informed her that I would send the order/referral to [facility] in [place]. Informed her I dont know the details on insurance and if they cover it. Patient was still ok with moving forward. Informed I will send the referral over today

## 2018-06-14 ENCOUNTER — Other Ambulatory Visit: Payer: Self-pay | Admitting: Neurology

## 2018-06-14 DIAGNOSIS — M542 Cervicalgia: Secondary | ICD-10-CM

## 2018-06-15 NOTE — Telephone Encounter (Signed)
sit up straight MRI [facility] in [place] faxed the order they will reach out to the patient to schedule. Phone number [phone] & fax # [phone].  [employer] Auth: [id number] (exp. 06/15/18 to 07/30/18)

## 2018-07-12 ENCOUNTER — Other Ambulatory Visit (HOSPITAL_COMMUNITY)
Admission: RE | Admit: 2018-07-12 | Discharge: 2018-07-12 | Disposition: A | Discharge status: Home / Self Care | Payer: 59 | Source: Ambulatory Visit | Attending: Neurology | Admitting: Neurology

## 2018-07-12 ENCOUNTER — Telehealth: Payer: Self-pay | Admitting: Neurology

## 2018-07-12 DIAGNOSIS — Z01812 Encounter for preprocedural laboratory examination: Secondary | ICD-10-CM | POA: Diagnosis not present

## 2018-07-12 DIAGNOSIS — Z1159 Encounter for screening for other viral diseases: Secondary | ICD-10-CM | POA: Insufficient documentation

## 2018-07-12 LAB — SARS CORONAVIRUS 2 (TAT 6-24 HRS): SARS Coronavirus 2: NEGATIVE

## 2018-07-12 NOTE — Telephone Encounter (Signed)
Mrs. Fulfer MRI from [facility]   06-30-2018   Result : Mild disc baulging and no spinal sten osis. , mildest foraminal narrowing at C5-6 right sided and  C2-3 left sided narrowing.   This MRI has not shown enhancing lesions or spinal or clinically relevant foraminal stenosis.    Larey Seat, MD   Please scan original report under MEDIA.

## 2018-07-12 NOTE — Telephone Encounter (Signed)
Pt called me to discuss MRI findings. Advised the pt of the findings. Informed her that Dr Brett Fairy didn't note anything that was of significant clinical concern. Patient wanted to know if the degenerative disc disease is the culprit to why she continues to have daily pain in her neck and shoulders as well as the bilateral hand numbness and tingling. Advised I would ask Dr Brett Fairy her thoughts on this and get back with her. Patient also scheduled to have sleep study pending covid test result later this week. I went ahead and scheduled the patient 10/8 for a follow up visit and advised that I would put on wait list to possibly see her sooner depending on what the results indicate from the SS. Pt verbalized understanding. Patient requested a copy of the results be mailed to her. I have placed a copy in mail and confirmed the address is correct.

## 2018-07-15 ENCOUNTER — Other Ambulatory Visit: Payer: Self-pay

## 2018-07-15 ENCOUNTER — Ambulatory Visit (INDEPENDENT_AMBULATORY_CARE_PROVIDER_SITE_OTHER): Payer: 59 | Admitting: Neurology

## 2018-07-15 DIAGNOSIS — I059 Rheumatic mitral valve disease, unspecified: Secondary | ICD-10-CM

## 2018-07-15 DIAGNOSIS — R5382 Chronic fatigue, unspecified: Secondary | ICD-10-CM

## 2018-07-15 DIAGNOSIS — G4733 Obstructive sleep apnea (adult) (pediatric): Secondary | ICD-10-CM | POA: Diagnosis not present

## 2018-07-15 DIAGNOSIS — I472 Ventricular tachycardia: Secondary | ICD-10-CM

## 2018-07-15 DIAGNOSIS — I4729 Other ventricular tachycardia: Secondary | ICD-10-CM

## 2018-07-15 DIAGNOSIS — G4701 Insomnia due to medical condition: Secondary | ICD-10-CM

## 2018-07-20 ENCOUNTER — Other Ambulatory Visit: Payer: Self-pay

## 2018-07-20 ENCOUNTER — Emergency Department (HOSPITAL_BASED_OUTPATIENT_CLINIC_OR_DEPARTMENT_OTHER)
Admission: EM | Admit: 2018-07-20 | Discharge: 2018-07-20 | Disposition: A | Discharge status: Home / Self Care | Payer: 59 | Attending: Emergency Medicine | Admitting: Emergency Medicine

## 2018-07-20 ENCOUNTER — Encounter (HOSPITAL_BASED_OUTPATIENT_CLINIC_OR_DEPARTMENT_OTHER): Payer: Self-pay | Admitting: *Deleted

## 2018-07-20 ENCOUNTER — Emergency Department (HOSPITAL_BASED_OUTPATIENT_CLINIC_OR_DEPARTMENT_OTHER): Payer: 59

## 2018-07-20 DIAGNOSIS — R42 Dizziness and giddiness: Secondary | ICD-10-CM

## 2018-07-20 DIAGNOSIS — Z79899 Other long term (current) drug therapy: Secondary | ICD-10-CM | POA: Diagnosis not present

## 2018-07-20 DIAGNOSIS — R002 Palpitations: Secondary | ICD-10-CM | POA: Insufficient documentation

## 2018-07-20 LAB — CBC WITH DIFFERENTIAL/PLATELET
Abs Immature Granulocytes: 0.02 10*3/uL (ref 0.00–0.07)
Basophils Absolute: 0.1 10*3/uL (ref 0.0–0.1)
Basophils Relative: 1 %
Eosinophils Absolute: 0.2 10*3/uL (ref 0.0–0.5)
Eosinophils Relative: 2 %
HCT: 44.4 % (ref 36.0–46.0)
Hemoglobin: 14.5 g/dL (ref 12.0–15.0)
Immature Granulocytes: 0 %
Lymphocytes Relative: 25 %
Lymphs Abs: 1.9 10*3/uL (ref 0.7–4.0)
MCH: 30.8 pg (ref 26.0–34.0)
MCHC: 32.7 g/dL (ref 30.0–36.0)
MCV: 94.3 fL (ref 80.0–100.0)
Monocytes Absolute: 0.5 10*3/uL (ref 0.1–1.0)
Monocytes Relative: 7 %
Neutro Abs: 4.8 10*3/uL (ref 1.7–7.7)
Neutrophils Relative %: 65 %
Platelets: 216 10*3/uL (ref 150–400)
RBC: 4.71 MIL/uL (ref 3.87–5.11)
RDW: 11.7 % (ref 11.5–15.5)
WBC: 7.4 10*3/uL (ref 4.0–10.5)
nRBC: 0 % (ref 0.0–0.2)

## 2018-07-20 LAB — COMPREHENSIVE METABOLIC PANEL
ALT: 21 U/L (ref 0–44)
AST: 19 U/L (ref 15–41)
Albumin: 5 g/dL (ref 3.5–5.0)
Alkaline Phosphatase: 68 U/L (ref 38–126)
Anion gap: 14 (ref 5–15)
BUN: 19 mg/dL (ref 6–20)
CO2: 22 mmol/L (ref 22–32)
Calcium: 9.6 mg/dL (ref 8.9–10.3)
Chloride: 102 mmol/L (ref 98–111)
Creatinine, Ser: 0.87 mg/dL (ref 0.44–1.00)
GFR calc Af Amer: >60 mL/min (ref >60)
GFR calc non Af Amer: >60 mL/min (ref >60)
Glucose, Bld: 170 mg/dL — ABNORMAL HIGH (ref 70–99)
Potassium: 3.6 mmol/L (ref 3.5–5.1)
Sodium: 138 mmol/L (ref 135–145)
Total Bilirubin: 0.9 mg/dL (ref 0.3–1.2)
Total Protein: 7.8 g/dL (ref 6.5–8.1)

## 2018-07-20 LAB — D-DIMER, QUANTITATIVE: D-Dimer, Quant: <0.27 ug/mL-FEU (ref 0.00–0.50)

## 2018-07-20 NOTE — ED Provider Notes (Signed)
[facility] EMERGENCY DEPARTMENT Provider Note   CSN: [id number] Arrival date & time: 07/20/18  1909    History   Chief Complaint Chief Complaint  Patient presents with  . Dizziness  . Palpitations    HPI [name] is a 53 y.o. female.     Patient reporting a history of rapid heart rate and lightheadedness on and off for 2 weeks.  Recently today started at about 2:30 in the afternoon.  Felt as if the heart rate was going fast.  Associated with shortness of breath.  No chest pain.  Stopped around 530 this evening.  No syncope no true dizziness or room spinning no leg swelling.  Patient followed by cardiology in the past has had in a an ablation for ventricular arrhythmia.     Past Medical History:  Diagnosis Date  . Anxiety   . Arrhythmia ventricular    s/p ablation 2001  . Carpal tunnel syndrome, bilateral   . Chronic RLQ pain   . Dyslipidemia   . Dysrhythmia    V- tach- resolved with ablation 2002  . GERD (gastroesophageal reflux disease)   . Headache   . Hiatal hernia   . History of migraine headaches   . IBS (irritable bowel syndrome)   . Insomnia   . Menorrhagia   . Mitral valve disorder   . MVP (mitral valve prolapse)   . Plantar fasciitis   . PONV (postoperative nausea and vomiting)   . Uterine fibroid    small  . Ventricular tachycardia [facility])     Patient Active Problem List   Diagnosis Date Noted  . Chronic fatigue 07/08/2016  . Tremor of right hand 09/27/2015  . Clumsiness 09/27/2015  . Dysesthesia affecting both sides of body 08/14/2015  . DDD (degenerative disc disease), cervical 07/03/2015  . Fibroids, submucosal 12/07/2013  . Depression with anxiety 10/19/2013  . Cough 10/19/2013  . BMI 29.0-29.9,adult 09/07/2013  . Hyperlipidemia 10/04/2008  . VENTRICULAR TACHYCARDIA 10/04/2008  . RLQ PAIN 06/07/2007  . CARPAL TUNNEL SYNDROME, BILATERAL 05/06/2007  . Mitral valve disease 05/06/2007  . ABNORMAL HEART RHYTHMS  05/06/2007  . GERD 05/06/2007  . HIATAL HERNIA 05/06/2007  . IBS 05/06/2007  . PLANTAR FASCIITIS 05/06/2007    Past Surgical History:  Procedure Laterality Date  . ABDOMINAL HYSTERECTOMY    . BILATERAL SALPINGECTOMY Bilateral 12/07/2013   Procedure: BILATERAL SALPINGECTOMY;  Surgeon: Lovenia Kim, MD;  Location: [facility];  Service: Gynecology;  Laterality: Bilateral;  . CARDIAC ELECTROPHYSIOLOGY [place] AND ABLATION  2002  . cspine  05/1999   c5-6  . echocardiogram  2000/2001  . KNEE SURGERY  1998  . PATELLA ARTHROPLASTY    . ROBOTIC ASSISTED TOTAL HYSTERECTOMY N/A 12/07/2013   Procedure: ROBOTIC ASSISTED TOTAL HYSTERECTOMY;  Surgeon: Lovenia Kim, MD;  Location: [facility];  Service: Gynecology;  Laterality: N/A;  . SPINE SURGERY       OB History   No obstetric history on file.      Home Medications    Prior to Admission medications   Medication Sig Start Date End Date Taking? Authorizing Provider  ALPRAZolam (XANAX) 1 MG tablet TAKE 1 TABLET (1 MG TOTAL) BY MOUTH AT BEDTIME AS NEEDED FOR ANXIETY. 06/03/17   Harrison Mons, PA  ALPRAZolam (XANAX) 1 MG tablet Take 1 tablet (1 mg total) by mouth at bedtime as needed for anxiety. 07/03/17   Harrison Mons, PA  ALPRAZolam [name]) 1 MG tablet Take 1 tablet (1 mg total) by  mouth at bedtime as needed for anxiety. 08/02/17   Harrison Mons, PA  atorvastatin (LIPITOR) 20 MG tablet TAKE 1 TABLET BY MOUTH EVERY DAY 12/14/16   Harrison Mons, PA  cyclobenzaprine (FLEXERIL) 10 MG tablet Take 10 mg by mouth at bedtime as needed. 07/13/15   [provider]  ipratropium (ATROVENT) 0.03 % nasal spray Place 2 sprays into both nostrils 2 (two) times daily. 08/28/15   Harrison Mons, PA  Multiple Vitamin (MULTI-VITAMINS) TABS Take by mouth.    [provider]  pantoprazole (PROTONIX) 40 MG tablet TAKE 1 TABLET TWICE A DAY (NEED OFFICE VISIT FOR FURTHER REFILLS) 05/04/17   Irene Shipper, MD    Family History Family History   Problem Relation Age of Onset  . Hiatal hernia Mother   . Irritable bowel syndrome Mother   . Hypertension Mother   . Thyroid disease Mother   . Heart disease Mother 20       AMI, 90% LAD, stent placement, cardiac arrest  . Lung cancer Father        (+ TOBACCO)  . Hypertension Sister   . Colitis Maternal Grandmother        Crohns VS U.C  . Seizures Sister   . Headache Sister   . Pancreatic cancer Paternal Uncle   . Colon cancer Neg Hx     Social History Social History   Tobacco Use  . Smoking status: Never Smoker  . Smokeless tobacco: Never Used  Substance Use Topics  . Alcohol use: Yes    Alcohol/week: 5.0 standard drinks    Types: 5 Glasses of wine per week    Comment: occ  . Drug use: No     Allergies   Covera-hs [verapamil], Crestor [rosuvastatin calcium], and Wellbutrin [bupropion]   Review of Systems Review of Systems  Constitutional: Negative for chills and fever.  HENT: Negative for congestion, rhinorrhea and sore throat.   Eyes: Negative for visual disturbance.  Respiratory: Negative for cough and shortness of breath.   Cardiovascular: Positive for palpitations. Negative for chest pain and leg swelling.  Gastrointestinal: Negative for abdominal pain, diarrhea, nausea and vomiting.  Genitourinary: Negative for dysuria.  Musculoskeletal: Negative for back pain and neck pain.  Skin: Negative for rash.  Neurological: Positive for light-headedness. Negative for dizziness and headaches.  Hematological: Does not bruise/bleed easily.  Psychiatric/Behavioral: Negative for confusion.     Physical Exam Updated Vital Signs BP 122/82   Pulse 88   Temp 98.1 F (36.7 C) (Oral)   Resp 18   Ht 1.651 m (5\' 5" )   Wt 82.6 kg   LMP 12/03/2014   SpO2 95%   BMI 30.29 kg/m   Physical Exam Vitals signs and nursing note reviewed.  Constitutional:      General: She is not in acute distress.    Appearance: Normal appearance. She is well-developed.  HENT:      Head: Normocephalic and atraumatic.  Eyes:     Extraocular Movements: Extraocular movements intact.     Conjunctiva/sclera: Conjunctivae normal.     Pupils: Pupils are equal, round, and reactive to light.  Neck:     Musculoskeletal: Normal range of motion and neck supple.  Cardiovascular:     Rate and Rhythm: Regular rhythm. Tachycardia present.     Heart sounds: No murmur.  Pulmonary:     Effort: Pulmonary effort is normal. No respiratory distress.     Breath sounds: Normal breath sounds.  Abdominal:     Palpations: Abdomen  is soft.     Tenderness: There is no abdominal tenderness.  Musculoskeletal: Normal range of motion.        General: No swelling.  Skin:    General: Skin is warm and dry.     Capillary Refill: Capillary refill takes less than 2 seconds.  Neurological:     General: No focal deficit present.     Mental Status: She is alert and oriented to person, place, and time.      ED Treatments / Results  Labs (all labs ordered are listed, but only abnormal results are displayed) Labs Reviewed  COMPREHENSIVE METABOLIC PANEL - Abnormal; Notable for the following components:      Result Value   Glucose, Bld 170 (*)    All other components within normal limits  CBC WITH DIFFERENTIAL/PLATELET  D-DIMER, QUANTITATIVE (NOT AT [facility])    EKG EKG Interpretation  Date/Time:  Tuesday July 20 2018 19:32:01 EDT Ventricular Rate:  118 PR Interval:    QRS Duration: 91 QT Interval:  322 QTC Calculation: 452 R Axis:   15 Text Interpretation:  Sinus tachycardia Borderline T abnormalities, diffuse leads No significant change since last tracing Confirmed by Fredia Sorrow [phone]) on 07/20/2018 7:55:00 PM   Radiology Dg Chest 2 View  Result Date: 07/20/2018 CLINICAL DATA:  Intermittent palpitations for the past 2 weeks. EXAM: CHEST - 2 VIEW COMPARISON:  02/29/2016. FINDINGS: Normal sized heart. Mildly tortuous aorta. Clear lungs with normal vascularity. Stable mild  peribronchial thickening. Unremarkable bones. IMPRESSION: No acute abnormality. Stable mild chronic bronchitic changes. Electronically Signed   By: Claudie Revering M.D.   On: 07/20/2018 21:49    Procedures Procedures (including critical care time)  Medications Ordered in ED Medications - No data to display   Initial Impression / Assessment and Plan / ED Course  I have reviewed the triage vital signs and the nursing notes.  Pertinent labs & imaging results that were available during my care of the patient were reviewed by me and considered in my medical decision making (see chart for details).        Patient with cardiac monitoring here without any arrhythmias.  Initially had some baseline sinus tachycardia heart rate right around 100.  Chest x-ray negative d-dimer negative for pulmonary embolism.  Patient's oxygen sats were fine.  Basic labs without any significant abnormality.  Patient's heart rate now is down in the 80s.  Without any intervention.  Patient followed by Dr. Lovena Le from cardiology had an ablation years ago.  But patient is giving a good history for a rapid palpitation type presentation so recommend follow-up with cardiology they want to consider home cardiac monitoring.  Patient will return for any new or worse symptoms.  Final Clinical Impressions(s) / ED Diagnoses   Final diagnoses:  Palpitations  Lightheadedness    ED Discharge Orders    None       Fredia Sorrow, MD 07/20/18 2226

## 2018-07-20 NOTE — ED Triage Notes (Signed)
Palpitations and dizziness on and off for 2 weeks.

## 2018-07-20 NOTE — Discharge Instructions (Signed)
Recommend making appointment follow-up with cardiology.  Would recommend doing that tomorrow.  Today's work-up chest x-ray negative labs without significant abnormalities.  Screening test for blood clots in the lungs was negative.  Cardiac monitoring without any arrhythmia.  Return for any rapid heart rate or palpitations lasting 45 minutes or longer.

## 2018-07-21 ENCOUNTER — Telehealth: Payer: Self-pay | Admitting: Internal Medicine

## 2018-07-21 DIAGNOSIS — R002 Palpitations: Secondary | ICD-10-CM

## 2018-07-21 NOTE — Telephone Encounter (Signed)
Pt called scheduling to set up an ER fu from yesterday for palpitaitons. ER note mention poss cardiac monitoring, is this something we would like to order then have fu or fu appt first to determine? Pls advise

## 2018-07-22 ENCOUNTER — Other Ambulatory Visit: Payer: Self-pay | Admitting: Obstetrics and Gynecology

## 2018-07-22 ENCOUNTER — Telehealth: Payer: Self-pay | Admitting: Neurology

## 2018-07-22 DIAGNOSIS — Z1231 Encounter for screening mammogram for malignant neoplasm of breast: Secondary | ICD-10-CM

## 2018-07-22 NOTE — Telephone Encounter (Signed)
Called the [name] and informed her I had her on wait list to come in sooner to discuss MRI findings and questions she had. [name] states that she had her sleep test. Advised the [name] that I would let Dr Brett Fairy know she is coming on Monday morning in case she has a chance to reach out to read her study that way we will have those results as well for her as well on Monday. [name] was appreciative

## 2018-07-23 DIAGNOSIS — G4701 Insomnia due to medical condition: Secondary | ICD-10-CM | POA: Insufficient documentation

## 2018-07-23 DIAGNOSIS — G4733 Obstructive sleep apnea (adult) (pediatric): Secondary | ICD-10-CM | POA: Insufficient documentation

## 2018-07-23 NOTE — Procedures (Signed)
PATIENT'S NAME:  Sandra, Larsen DOB:      1965/11/25      MR#:    [id number]     DATE OF RECORDING: 07/15/2018 REFERRING M.D.:  Harrison Mons, [place] Study Performed:  Split-Night Titration Study HISTORY:   03-18-2018; [name] is a 53 y.o. female patient who followed [place] for Migraines, and is now requesting transfer of care for sleep apnea. She had a HST through [facility] less than a year ago, and was prescribed CPAP. She has not done well with CPAP, she frequently wakes up, and through her DME ([facility]) she was then scheduled for ONO. Apparently this yielded abnormal result, as she was furbished with an oxygen concentrator.  Chief complaint according to patient: "Insomnia, sleeping 1-2 hours, can barely function." I am fatigued but not sleepy". Sleep perception disorder?   She had the following tests- I am not privy to the data, but the 05-2017 HST documented an AHI of 30.1/ h, Nadir was 78%. Length of desaturation was 16 seconds.  She was placed on PAP autotitration- and the ONO July 2019 resulted in 21 minutes total desaturation Time, desaturation index of 9/h.   CPAP compliance - 97 %, [name] compliance report shows 97% compliance for over 4 hours of daily use with an average 8 hours 57 minutes of use.  The AutoSet provides pressure between 5 and 15 cmH2O the 3 cm expiratory pressure relief.  She does not have central apneas emerging her residual AHI is 1.2 which is an excellent resolution of obstructive sleep apnea she has minimal air leaks, the 95th percentile pressure is 8.4 cmH2O.    Using CPAP has benefited the patient's headache marginally-still has a HA frequency of almost daily.   Anxiety, Ventricular arrhythmia, CTS, Dyslipidemia, GERD, Headache, Hiatal hernia, Migraines, IBS, Insomnia, Menorrhagia, MVP, Plantar fasciitis, PONV, Uterine fibroid  The patient endorsed the Epworth Sleepiness Scale at 2 points   The patient's weight 190 pounds with a height of 63 (inches), resulting  in a BMI of 33.6 kg/m2. The patient's neck circumference measured 15.2 inches.  CURRENT MEDICATIONS: Xanax, Lipitor, Flexeril, Atrovent, Protonix, Multivitamin  PROCEDURE:  This is a multichannel digital polysomnogram utilizing the Somnostar 11.2 system.  Electrodes and sensors were applied and monitored per AASM Specifications.   EEG, EOG, Chin and Limb EMG, were sampled at 200 Hz.  ECG, Snore and Nasal Pressure, Thermal Airflow, Respiratory Effort, CPAP Flow and Pressure, Oximetry was sampled at 50 Hz. Digital video and audio were recorded.      BASELINE STUDY WITHOUT CPAP RESULTS: Lights Out was at 22:56 and Lights On at 05:00.  Total recording time (TRT) was 87.5, with a total sleep time (TST) of 33 minutes.   The patient's sleep latency was 34 minutes.  REM latency was 198 minutes, no REM sleep during baseline part.  The sleep efficiency was extremely poor at 37.7 %.    SLEEP ARCHITECTURE: WASO (Wake after sleep onset) was 16 minutes, Stage N1 was 10.5 minutes, Stage N2 was 22.5 minutes, Stage N3 was 0 minutes and Stage R (REM sleep) was 0 minutes.  The percentages were Stage N1 31.8%, Stage N2 68.2%, Stage N3 0% and Stage R (REM sleep) 0%.    RESPIRATORY ANALYSIS:  There were a total of 26 respiratory events:  0 apneas and 26 hypopneas with a hypopnea index of 47.3.    The total APNEA/HYPOPNEA INDEX (AHI) was 47.3 /hour. The REM AHI was 0 /hour versus a non-REM AHI of 47.3 /  hour. The patient spent 14 minutes sleep time in the supine position 167 minutes in non-supine. The supine AHI was 120.0 /hour versus a non-supine AHI of 45.0 /hour.  OXYGEN SATURATION & C02:  The wake baseline 02 saturation was 98%, with the lowest being 88%. Time spent below 89% saturation equaled 0 minutes.  The patient had a total of 0 Periodic Limb Movements. The arousals were noted as: 3 were spontaneous, 0 were associated with PLMs, and 17 were associated with respiratory events.  Audio and video analysis did not  show any abnormal or unusual movements, behaviors, phonations or vocalizations, except for sudden snoring, mild - moderate.   EKG was in keeping with normal sinus rhythm (NSR)  TITRATION STUDY WITH CPAP RESULTS:   CPAP was initiated at 5 cmH20 with heated humidity per AASM split night standards and pressure was advanced to 11 cmH20 because of hypopneas, apneas and desaturations.  At a PAP pressure of 10 cmH20, there was a reduction of the AHI to 2.8, and a total sleep time of 65 minutes, 97% sleep efficiency. Nadir of 87%, REM sleep noted. .   Total recording time (TRT) was 276.5 minutes, with a total sleep time (TST) of 148 minutes. The patient's sleep latency was 51.5 minutes. REM latency was 132 minutes.  The sleep efficiency was now 53.5 %.    SLEEP ARCHITECTURE: Wake after sleep was 57.5 minutes, Stage N1 18.5 minutes, Stage N2 100.5 minutes, Stage N3 16.5 minutes and Stage R (REM sleep) 12.5 minutes. The percentages were: Stage N1 12.5%, Stage N2 67.9%, Stage N3 11.1% and Stage R (REM sleep) 8.4%. The sleep architecture was notable for an improvement in sleep efficiency and REM rebound.   RESPIRATORY ANALYSIS:  There were a total of 16 respiratory events: 0 apneas and 16 hypopneas.     The total APNEA/HYPOPNEA INDEX (AHI) was 6.5 /hour and the total RESPIRATORY DISTURBANCE INDEX was 6.5 /hour.  4 events occurred in REM sleep and 12 events in NREM. The REM AHI was 19.2 /hour versus a non-REM AHI of 5.3 /hour. The patient spent 9% of total sleep time in the supine position. The supine AHI was 9.2 /hour, versus a non-supine AHI of 6.2/hour.  OXYGEN SATURATION & C02:  The wake baseline 02 saturation was 94%, with the lowest being 86%. Time spent below 89% saturation equaled 4 minutes.  The arousals were noted as: 20 were spontaneous, 0 were associated with PLMs, and 9 were associated with respiratory events. EKG was in NSR. Snoring was no longer present.  The patient had a total of 0 Periodic Limb  Movements.  Post-study, the patient indicated that sleep was the same as usual. The patient used her own mask, and took Aleve at 1 AM.   POLYSOMNOGRAPHY IMPRESSION :   1. Severe Obstructive Sleep Apnea (OSA) at AHI 47.3/h with strong supine accentuation.   2. Lack of REM sleep while apnea untreated. No prolonged hypoxemia. 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  3. With initiation of CPAP, there was a benefit at 8, 9 and 10 cm water pressure and improvement of sleep efficiency. REM sleep rebounded. The patient slept from 1.20 AM through about 3.50 AM, but was unable to initiate sleep again after dream sleep. Each REM episode let to direct arousal into wakefulness- none of the observed physiological parameter triggered this arousal- it was spontaneous.    RECOMMENDATIONS:  Yes, there is severe obstructive sleep apnea, but no need for oxygen - the PSG did not show prolonged  hypoxia of sleep.  The best tolerated positive airway pressure was indeed 8-10 cm water CPAP.  I cannot explain the cause of insomnia with these results. Chronic insomnia can be related to chronic pain and in that case would need pain clinic follow up.   A follow up appointment will be scheduled in the Sleep Clinic at [facility].      I certify that I have reviewed the entire raw data recording prior to the issuance of this report in accordance with the Standards of Accreditation of the American Academy of Sleep Medicine (AASM)    Larey Seat, M.D.  07-23-2018 Diplomat, American Board of Psychiatry and Neurology  Diplomat, [place] of Sleep Medicine Medical Director, [place] [facility] at [facility]

## 2018-07-25 ENCOUNTER — Encounter: Payer: Self-pay | Admitting: Adult Health

## 2018-07-25 NOTE — Telephone Encounter (Signed)
It would be ok to have her wear a 14 day zio patch before coming in to see me. Her palpitations have been quiet for a long time. GT

## 2018-07-26 ENCOUNTER — Ambulatory Visit (INDEPENDENT_AMBULATORY_CARE_PROVIDER_SITE_OTHER): Payer: 59 | Admitting: Neurology

## 2018-07-26 ENCOUNTER — Encounter: Payer: Self-pay | Admitting: Neurology

## 2018-07-26 ENCOUNTER — Other Ambulatory Visit: Payer: Self-pay

## 2018-07-26 VITALS — BP 131/77 | HR 88 | Temp 98.6°F | Ht 64.0 in | Wt 184.0 lb

## 2018-07-26 DIAGNOSIS — Z9989 Dependence on other enabling machines and devices: Secondary | ICD-10-CM

## 2018-07-26 DIAGNOSIS — G4733 Obstructive sleep apnea (adult) (pediatric): Secondary | ICD-10-CM | POA: Diagnosis not present

## 2018-07-26 DIAGNOSIS — G4701 Insomnia due to medical condition: Secondary | ICD-10-CM

## 2018-07-26 DIAGNOSIS — I472 Ventricular tachycardia: Secondary | ICD-10-CM | POA: Diagnosis not present

## 2018-07-26 DIAGNOSIS — G8929 Other chronic pain: Secondary | ICD-10-CM | POA: Diagnosis not present

## 2018-07-26 DIAGNOSIS — R5382 Chronic fatigue, unspecified: Secondary | ICD-10-CM

## 2018-07-26 DIAGNOSIS — I4729 Other ventricular tachycardia: Secondary | ICD-10-CM

## 2018-07-26 NOTE — Progress Notes (Signed)
SLEEP MEDICINE CLINIC   Provider:  Larey Seat, MD   Primary Care Physician:  Harrison Mons, [place]   Referring Provider: Harrison Mons, PA    Chief Complaint  Patient presents with   Follow-up    pt here today to discuss MRI results and sleep study results. pt current machine is from [place] and year old.      Face to face -Interval history form 07-26-2018,   The patient just underwent a Sleep study, an upright MRI and had an ED visit.  She had ventricular tachycardia and went to ED, had been on Flecainide in the past.  Her sleep study confirmed the current CPAP settings as effective.   POLYSOMNOGRAPHY IMPRESSION :   1. Severe Obstructive Sleep Apnea (OSA) at AHI 47.3/h with strong supine accentuation.   2. Lack of REM sleep while apnea untreated. No prolonged hypoxemia. 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  3. With initiation of CPAP, there was a benefit at 8, 9 and 10 cm water pressure and improvement of sleep efficiency. REM sleep rebounded. The patient slept from 1.20 AM through about 3.50 AM, but was unable to initiate sleep again after dream sleep. Each REM episode let to direct arousal into wakefulness- none of the observed physiological parameter triggered this arousal- it was spontaneous.   RECOMMENDATIONS:  Yes, there is severe obstructive sleep apnea, but no need for oxygen - the PSG did not show prolonged hypoxia of sleep.  The best tolerated positive airway pressure was indeed 8-10 cm water CPAP.  I cannot explain the cause of insomnia with these results. Chronic insomnia can be related to chronic pain and in that case would need pain clinic follow up.   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  patient has undergone neurocognitive testing and has a fibromyalgia specialist in [place].  She has been fatigued and feels often cognitively foggy. She is always having pain in head and neck     03-18-2018/ HPI:  [name]  is a 53 y.o. female who used to be seen for Migraines, and wants [place] to follow for sleep apnea. She had a HST through [place], and was prescribed CPAP. She frequently wakes still up, she had through [facility] been scheduled for ONO. Apparently abnormal result, as she was furbished with an oxygen concentrator.  She is followed by Dr. Coletta Memos and Quay Burow, [place].  Carried a diagnosis of insomnia, chronic and migraine, fibromyalgia, and now OSA.  She had the following tests- I am not privy to the data, but the 05-2017 HST documented an AHI of 30.1/ h, )2 Nadir was 78%. Length of desaturation was 16 seconds.  She was placed on autotitration- 5 through 15 cm water , 3 cm EPR - AHI of 1.2 /h and ONO July 2019, 21 minutes total desat. Time, desaturation 9/h.   CPAP compliance - 97 % , Mrs. have to [facility] compliance report shows 97% compliance for over 4 hours of daily use with an average 8 hours 57 minutes of use.  The AutoSet provides pressure between 5 and 15 cmH2O the 3 cm expiratory pressure relief.  She does not have central apneas emerging her residual AHI is 1.2 which is an excellent resolution of obstructive sleep apnea she has minimal air leaks, the 95th percentile pressure is 8.4 cmH2O.    We could discuss reducing the pressure, but since this is an auto titration device she will not be exposed to higher pressures in her  body demands.  Using CPAP has benefited the patient's headache marginally-still has a HA frequency of almost daily. She wakes up often with headaches however in the middle of the night.   Chief complaint according to patient : "Insomnia, sleeping 1-2 hours, can barely function." I am fatigued but not sleepy". Sleep perception disorder?   Sleep habits are as follows:  Dinnertime for the patient is usually 6 PM, she will spend the evening with relaxing activities sometimes a walk sometimes TV, sometimes reading.  Bedtime will be around midnight, the bedroom is cool, quiet and dark.  She  shares a bedroom with her husband who has witnessed her apneas and snoring before she was treated with CPAP.  Mostly she sleeps on her sides but since she is using CPAP she has begun more sleeping in supine as not to dislodge the mask. She likes the dream wear mask. Sleeps on 2 pillows. Most evenings her sleep latency exceeds an hour, and once she is asleep she feels that she only can sleep 2 to 3 hours before waking up again.  She suspects that discomfort is part of this since her apnea is treated. Any arousal from sleep leads to a prolonged, protracted period of wakefulness unable to return to sleep. If she is lucky if she feels that she may get another 2 hours of sleep after that.  It is her estimate that she only sleeps about 4 hours at night and that the high compliance of CPAP use of 8 hours 57 minutes consist mostly of being wakeful with his CPAP in place.  She has to rise in the morning at 8:30 AM, she can work from home.  Sleep medical history: V tach status posy cardiac ablation, fusion in 2002, cervical spine 5-6-7.  After her hysterectomy in the year 2015 she had difficulties starting to breathe on her own after extubation and to breathe deep enough, presenting with hypopnea and hypoxemia. Migraine, Neck and shoulder pain patient who was given the diagnosis of fibromyalgia at the [facility] in [place].   Family sleep history: insomnia - father. Brother, sister snore loudly as does mother.   Social history: Married, 37 and 72 year old children. She may have 4 or 5 glasses of wine with dinner per week at the most, she is a non-smoker nontobacco user, caffeine use 1 cup of coffee in the morning not every morning.  No sodas, no ice tea or hot tea.     Review of Systems:  Heat and cold sensitive, flushing, feeling the windpipe is tight.  Out of a complete 14 system review, the patient complains of only the following symptoms, and all other reviewed systems are negative.  Anxiety, pain,  aches, and medication resistant insomnia.   Epworth score only endorsed at 2 points , Fatigue severity score: 53/ 63 points.    " I didn't snore before my anterior cervical fusion"    CPAP compliance download: 07-26-2018, The patient is 100% compliant CPAP user including the data from last night 25 July 2018 visit average user time 8 hours and 31 minutes.  She is using AutoSet currently between 5 and 15 cmH2O was 2 cm EPR I have already sent an order to change so simply decreased the window starting higher and ending lower.  There are some significant air leaks some nights but these of peak air leaks when the mask is dislodged.  The 95th percentile pressure is 8.6 cmH2O and her residual AHI is 1.4/ h which is  excellent resolution there are no central apneas arising.    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[employer] resource strain: Not on file   Food insecurity    Worry: Not on file    Inability: Not on file   Transportation needs    Medical: Not on file    Non-medical: Not on file  Tobacco Use   Smoking status: Never Smoker   Smokeless tobacco: Never Used  Substance and Sexual Activity   Alcohol use: Yes    Alcohol/week: 5.0 standard drinks    Types: 5 Glasses of wine per week    Comment: occ   Drug use: No   Sexual activity: Yes    Partners: Male    Birth control/protection: None    Comment: h/o infertility  Lifestyle   Physical activity    Days per week: Not on file    Minutes per session: Not on file   Stress: Not on file  Relationships   Social connections    Talks on phone: Not on file    Gets together: Not on file    Attends religious service: Not on file    Active member  of club or organization: Not on file    Attends meetings of clubs or organizations: Not on file    Relationship status: Not on file   Intimate partner violence    Fear of current or ex partner: Not on file    Emotionally abused: Not on file    Physically abused: Not on file    Forced sexual activity: Not on file  Other Topics Concern   Not on file  Social History Narrative   Patient is married [name]) lives with her husband (2nd marriage, [employer]). Two daughters live with them.  Oldest daughter is grown and lives locally with her husband and children.   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   Patient is right-handed.   Patient drinks one cup of caffeine daily.    Family History  Problem Relation Age of Onset   Hiatal hernia Mother    Irritable bowel syndrome Mother    Hypertension Mother    Thyroid disease Mother    Heart disease Mother 28       AMI, 90% LAD, stent placement, cardiac arrest   Lung cancer Father        (+ TOBACCO)   Hypertension Sister    Colitis Maternal Grandmother        Crohns VS U.C   Seizures Sister    Headache Sister    Pancreatic cancer Paternal Uncle    Colon cancer Neg Hx     Past Medical History:  Diagnosis Date   Anxiety    Arrhythmia ventricular    s/p ablation 2001   Carpal tunnel syndrome, bilateral    Chronic RLQ pain    Dyslipidemia    Dysrhythmia    V- tach- resolved with ablation 2002   GERD (gastroesophageal reflux disease)    Headache    Hiatal hernia    History of migraine headaches    IBS (  irritable bowel syndrome)    Insomnia    Menorrhagia    Mitral valve disorder    MVP (mitral valve prolapse)    Plantar fasciitis    PONV (postoperative nausea and vomiting)    Uterine fibroid    small   Ventricular tachycardia (HCC)     Past Surgical History:  Procedure Laterality Date   ABDOMINAL HYSTERECTOMY      BILATERAL SALPINGECTOMY Bilateral 12/07/2013   Procedure: BILATERAL SALPINGECTOMY;  Surgeon: Lovenia Kim, MD;  Location: [facility];  Service: Gynecology;  Laterality: Bilateral;   CARDIAC ELECTROPHYSIOLOGY MAPPING AND ABLATION  2002   cspine  05/1999   c5-6   echocardiogram  2000/2001   KNEE SURGERY  1998   PATELLA ARTHROPLASTY     ROBOTIC ASSISTED TOTAL HYSTERECTOMY N/A 12/07/2013   Procedure: ROBOTIC ASSISTED TOTAL HYSTERECTOMY;  Surgeon: Lovenia Kim, MD;  Location: [facility];  Service: Gynecology;  Laterality: N/A;   SPINE SURGERY      Current Outpatient Medications  Medication Sig Dispense Refill   ALPRAZolam (XANAX) 1 MG tablet Take 1 tablet (1 mg total) by mouth at bedtime as needed for anxiety. 30 tablet 0   atorvastatin (LIPITOR) 20 MG tablet TAKE 1 TABLET BY MOUTH EVERY DAY 90 tablet 3   cyclobenzaprine (FLEXERIL) 10 MG tablet Take 10 mg by mouth at bedtime as needed.  3   gabapentin (NEURONTIN) 300 MG capsule TAKE 1 CAPSULE BY MOUTH NIGHTLY FOR 1 WEEK THEN 2CAPS NIGHTLY AS TOLERATED     ipratropium (ATROVENT) 0.03 % nasal spray Place 2 sprays into both nostrils 2 (two) times daily. 30 mL 12   Multiple Vitamin (MULTI-VITAMINS) TABS Take by mouth.     pantoprazole (PROTONIX) 40 MG tablet TAKE 1 TABLET TWICE A DAY (NEED OFFICE VISIT FOR FURTHER REFILLS) 180 tablet 3   Current Facility-Administered Medications  Medication Dose Route Frequency Provider Last Rate Last Dose   0.9 %  sodium chloride infusion  500 mL Intravenous Continuous Irene Shipper, MD        Allergies as of 07/26/2018 - Review Complete 07/26/2018  Allergen Reaction Noted   Covera-hs [verapamil]  05/06/2007   Crestor [rosuvastatin calcium]  10/15/2010   Wellbutrin [bupropion] Palpitations 01/02/2015    Vitals: BP 131/77    Pulse 88    Temp 98.6 F (37 C)    Ht 5\' 4"  (1.626 m)    Wt 184 lb (83.5 kg)    LMP 12/03/2014    BMI 31.58 kg/m  Last Weight:  Wt Readings from Last 1 Encounters:    07/26/18 184 lb (83.5 kg)   AST:MHDQ mass index is 31.58 kg/m.     Last Height:   Ht Readings from Last 1 Encounters:  07/26/18 5\' 4"  (1.626 m)    Physical exam:  General: The patient is awake, alert and appears not in acute distress. The patient is well groomed. Head: Normocephalic, atraumatic.  Neck is supple. Mallampati  2-3 , not very tight.  neck circumference:15." . Nasal airflow seasonal affected , rhinitis.  Mild Retrognathia.  Cardiovascular:  Regular rate and rhythm, without  murmurs or carotid bruit, and without distended neck veins. Respiratory: Lungs are clear to auscultation. Skin:  Without evidence of edema, or rash Trunk: BMI is elevated at 31.5 kg/m2 . The patient's posture is stooped.   Neurologic exam : The patient is awake and alert, oriented to place and time.    Attention span & concentration ability appears normal.  Speech is  fluent,  without dysarthria, dysphonia or aphasia.  Mood and affect are appropriate.  Cranial nerves: Pupils are equal and briskly reactive to light. Funduscopic exam without  evidence of pallor or edema. Extraocular movements  in vertical and horizontal planes intact and without nystagmus. Visual fields by finger perimetry are intact. Hearing to finger rub intact. Intact sensation intact to fine touch. Facial motor strength is symmetric and tongue and uvula move midline. Shoulder shrug was symmetrical.   Motor exam:  Normal tone, muscle bulk and symmetric strength in all extremities.  Sensory:  Fine touch, pinprick and vibration were tested in all extremities. The sense of vibration is decreased in her ankles- without edema. Knee and elbow are sensitive to vibration.  Proprioception tested in the upper extremities was normal.  Coordination: Rapid alternating movements in the fingers/hands was normal. Finger-to-nose maneuver  normal without evidence of ataxia, dysmetria or tremor.  Gait and station: Patient walks without assistive  device and is able unassisted to walk, no need for bracing.  Strength within normal limits.  Stance is stable and normal.  Turns with  3 Steps.  Deep tendon reflexes: in the upper and lower extremities are symmetrically present / intact. Babinski maneuver response is  downgoing.   Assessment:  After physical and neurologic examination, review of laboratory studies,  Personal review of imaging studies, reports of other /same  Imaging studies, results of polysomnography and / or neurophysiology testing and pre-existing records as far as provided in visit., my assessment is:  As far as I can reconstruct from [name] records of which she provided many, she was diagnosed by a home sleep test with presumably obstructive sleep apnea at an AHI of 30/h.     1)  OSA may is still  severe at 47.3/h, no hypoxemia, loud snoring, upper airway resistance  syndrome.    I will send an order to re set the CPAP machine. Goal is now 6-12 cm water with 2 cm EPR.  No oxygen needed.  2) she has chronic pain, all the time. Was diagnosed as having fibromylalgia-    I spent more than 20 minutes of face to face time with the patient.  Greater than 50% of time was spent in counseling and coordination of care. We have discussed the diagnosis and differential and I answered the patient's questions.    Plan:  Treatment plan and additional workup :  I like to encourage her to give gabapentin another 3-4 weeks before she can give Dr. Jeani Hawking an observation. She needs to continue on CPAP.    Rv in 3- 4 month with new CPAP data.   Larey Seat, MD 9/76/7341, 9:37 AM  Certified in Neurology by ABPN Certified in [place] by [facility] [address], [place], [place] [address]

## 2018-07-27 NOTE — Addendum Note (Signed)
Addended by: Willeen Cass A on: 07/27/2018 08:02 AM   Modules accepted: Orders

## 2018-07-28 ENCOUNTER — Telehealth: Payer: Self-pay | Admitting: *Deleted

## 2018-07-28 NOTE — Telephone Encounter (Signed)
14 day ZIO XT long term holter monitor to be mailed to patients home.  Instructions reviewed briefly as they are included in the monitor kit.

## 2018-08-14 ENCOUNTER — Ambulatory Visit (INDEPENDENT_AMBULATORY_CARE_PROVIDER_SITE_OTHER): Payer: 59

## 2018-08-14 DIAGNOSIS — R002 Palpitations: Secondary | ICD-10-CM

## 2018-08-16 ENCOUNTER — Encounter: Payer: Self-pay | Admitting: Neurology

## 2018-08-16 ENCOUNTER — Telehealth: Payer: Self-pay | Admitting: Neurology

## 2018-08-16 NOTE — Telephone Encounter (Signed)
Patient stated she had a sleep study a few weeks ago and is suppose to get her cpap adjusted and that has not been done yet.

## 2018-08-16 NOTE — Telephone Encounter (Signed)
Called the patient back to advise the order was sent to [facility] on 07/26/2018 and I received a fax confirmation that it went through around 10:19 am on 7/20.  There was no answer. LVM for the patient with this information above as well as advised her to contact [facility] and gave their phone number [phone]. Informed the pt on VM I will resend a second time for the patient. Advised the patient to call back with any questions.

## 2018-08-16 NOTE — Telephone Encounter (Signed)
Patient is returning your call.  

## 2018-09-06 ENCOUNTER — Ambulatory Visit: Payer: 59

## 2018-09-07 DIAGNOSIS — R002 Palpitations: Secondary | ICD-10-CM | POA: Diagnosis not present

## 2018-09-09 ENCOUNTER — Other Ambulatory Visit: Payer: Self-pay

## 2018-09-18 DIAGNOSIS — G4710 Hypersomnia, unspecified: Secondary | ICD-10-CM | POA: Diagnosis not present

## 2018-09-18 DIAGNOSIS — E669 Obesity, unspecified: Secondary | ICD-10-CM | POA: Diagnosis not present

## 2018-09-18 DIAGNOSIS — G4700 Insomnia, unspecified: Secondary | ICD-10-CM | POA: Diagnosis not present

## 2018-09-18 DIAGNOSIS — G4733 Obstructive sleep apnea (adult) (pediatric): Secondary | ICD-10-CM | POA: Diagnosis not present

## 2018-09-21 ENCOUNTER — Telehealth: Payer: Self-pay | Admitting: Internal Medicine

## 2018-09-21 NOTE — Telephone Encounter (Signed)
Patient called stating she received her results to her holter monitor, but she would like to discuss them with someone.  She would like like to now if she needs to come in for a follow up visit.

## 2018-09-22 NOTE — Telephone Encounter (Signed)
Pt agrees to see Dr.Taylor 10/13/18 at 4:15 pm

## 2018-09-22 NOTE — Telephone Encounter (Signed)
Spoke with the ot re: her monitor results... she has been having a lot more palpitations more than her usual. She is willing to try meds that Dr. Lovena Le can recommend. She would like to get in to see him but his schedule is full up until 12/2018... I have advised her that I will forward to huis nurse..   She declined an appt with an APP... she says she rather wait until 12/2018 but have [place] call her if anything opens up sooner.

## 2018-10-07 DIAGNOSIS — M1711 Unilateral primary osteoarthritis, right knee: Secondary | ICD-10-CM | POA: Diagnosis not present

## 2018-10-13 ENCOUNTER — Other Ambulatory Visit: Payer: Self-pay

## 2018-10-13 ENCOUNTER — Ambulatory Visit (INDEPENDENT_AMBULATORY_CARE_PROVIDER_SITE_OTHER): Payer: BC Managed Care – PPO | Admitting: Internal Medicine

## 2018-10-13 ENCOUNTER — Encounter: Payer: Self-pay | Admitting: Internal Medicine

## 2018-10-13 DIAGNOSIS — R000 Tachycardia, unspecified: Secondary | ICD-10-CM | POA: Insufficient documentation

## 2018-10-13 DIAGNOSIS — R002 Palpitations: Secondary | ICD-10-CM

## 2018-10-13 MED ORDER — METOPROLOL TARTRATE 25 MG PO TABS: ORAL_TABLET | ORAL | 11 refills | Status: DC

## 2018-10-13 NOTE — Patient Instructions (Addendum)
Medication Instructions:  Your physician has recommended you make the following change in your medication:   1.  Take metoprolol tartrate 25 mg-  Take one tablet by mouth as needed for palpitations.  You may take up to 4 tablets in a 24 hour period.   Labwork: None ordered.  Testing/Procedures: None ordered.  Follow-Up: Your physician wants you to follow-up in:  4-5 months with Dr. Lovena Le.   You will receive a reminder letter in the mail two months in advance. If you don't receive a letter, please call our office to schedule the follow-up appointment.   Any Other Special Instructions Will Be Listed Below (If Applicable).  If you need a refill on your cardiac medications before your next appointment, please call your pharmacy.

## 2018-10-13 NOTE — Progress Notes (Signed)
HPI [name] returns today for evaluation of palpitations. She is a pleasant 53 yo woman with a remote h/o PVC's which were controlled with a combination of catheter ablation and flecainide. She eventually weaned herself off of the flecainide and did well. She has gained over 50 lbs in the last 10 years. She has experience some recurrent palpitations and has the sensation that her heart is racing at times. No syncope. She wore a cardiac monitor which demonstrated rare PVC's, no VT, rare PACS and very brief and rare NS SVT which looks like atrial tachycardia. She also had sinus tachycardia for which she was symptomatic. She has become serious about losing weight and is down several pounds.  Allergies  Allergen Reactions  . Covera-Hs [Verapamil]     Blood blisters, Stevens-Johnson syndrome  . Crestor [Rosuvastatin Calcium]     Flu like symptoms, on different statin  . Wellbutrin [Bupropion] Palpitations     Current Outpatient Medications  Medication Sig Dispense Refill  . ALPRAZolam (XANAX) 1 MG tablet Take 1 tablet (1 mg total) by mouth at bedtime as needed for anxiety. 30 tablet 0  . atorvastatin (LIPITOR) 20 MG tablet TAKE 1 TABLET BY MOUTH EVERY DAY 90 tablet 3  . cyclobenzaprine (FLEXERIL) 10 MG tablet Take 10 mg by mouth at bedtime as needed.  3  . Multiple Vitamin (MULTI-VITAMINS) TABS Take by mouth.    . pantoprazole (PROTONIX) 40 MG tablet Take 40 mg by mouth daily.     Current Facility-Administered Medications  Medication Dose Route Frequency Provider Last Rate Last Dose  . 0.9 %  sodium chloride infusion  500 mL Intravenous Continuous Irene Shipper, MD         Past Medical History:  Diagnosis Date  . Anxiety   . Arrhythmia ventricular    s/p ablation 2001  . Carpal tunnel syndrome, bilateral   . Chronic RLQ pain   . Dyslipidemia   . Dysrhythmia    V- tach- resolved with ablation 2002  . GERD (gastroesophageal reflux disease)   . Headache   . Hiatal  hernia   . History of migraine headaches   . IBS (irritable bowel syndrome)   . Insomnia   . Menorrhagia   . Mitral valve disorder   . MVP (mitral valve prolapse)   . Plantar fasciitis   . PONV (postoperative nausea and vomiting)   . Uterine fibroid    small  . Ventricular tachycardia ([place])     ROS:   All systems reviewed and negative except as noted in the HPI.   Past Surgical History:  Procedure Laterality Date  . ABDOMINAL HYSTERECTOMY    . BILATERAL SALPINGECTOMY Bilateral 12/07/2013   Procedure: BILATERAL SALPINGECTOMY;  Surgeon: Lovenia Kim, MD;  Location: [facility];  Service: Gynecology;  Laterality: Bilateral;  . CARDIAC ELECTROPHYSIOLOGY [place] AND ABLATION  2002  . cspine  05/1999   c5-6  . echocardiogram  2000/2001  . KNEE SURGERY  1998  . PATELLA ARTHROPLASTY    . ROBOTIC ASSISTED TOTAL HYSTERECTOMY N/A 12/07/2013   Procedure: ROBOTIC ASSISTED TOTAL HYSTERECTOMY;  Surgeon: Lovenia Kim, MD;  Location: [facility];  Service: Gynecology;  Laterality: N/A;  . SPINE SURGERY       Family History  Problem Relation Age of Onset  . Hiatal hernia Mother   . Irritable bowel syndrome Mother   . Hypertension Mother   . Thyroid disease Mother   . Heart disease Mother 24  AMI, 90% LAD, stent placement, cardiac arrest  . Lung cancer Father        (+ TOBACCO)  . Hypertension Sister   . Colitis Maternal Grandmother        Crohns VS U.C  . Seizures Sister   . Headache Sister   . Pancreatic cancer Paternal Uncle   . Colon cancer Neg Hx      Social History   Socioeconomic History  . Marital status: Married    Spouse name: [name]  . Number of children: 2  . Years of education: 16  . Highest education level: Bachelor's degree (e.g., BA, AB, BS)  Occupational History  . Occupation: Scientist, water quality: [employer]   Social Needs  . Financial resource strain: Not on file  . Food insecurity    Worry: Not on file    Inability: Not  on file  . Transportation needs    Medical: Not on file    Non-medical: Not on file  Tobacco Use  . Smoking status: Never Smoker  . Smokeless tobacco: Never Used  Substance and Sexual Activity  . Alcohol use: Yes    Alcohol/week: 5.0 standard drinks    Types: 5 Glasses of wine per week    Comment: occ  . Drug use: No  . Sexual activity: Yes    Partners: Male    Birth control/protection: None    Comment: h/o infertility  Lifestyle  . Physical activity    Days per week: Not on file    Minutes per session: Not on file  . Stress: Not on file  Relationships  . Social Herbalist on phone: Not on file    Gets together: Not on file    Attends religious service: Not on file    Active member of club or organization: Not on file    Attends meetings of clubs or organizations: Not on file    Relationship status: Not on file  . Intimate partner violence    Fear of current or ex partner: Not on file    Emotionally abused: Not on file    Physically abused: Not on file    Forced sexual activity: Not on file  Other Topics Concern  . Not on file  Social History Narrative   Patient is married [name]) lives with her husband (2nd marriage, [employer]). Two daughters live with them.  Oldest daughter is grown and lives locally with her husband and children.   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   Patient is right-handed.   Patient drinks one cup of caffeine daily.     BP 126/72   Pulse 94   Ht 5\' 4"  (1.626 m)   Wt 172 lb (78 kg)   LMP 12/03/2014   SpO2 96%   BMI 29.52 kg/m   Physical Exam:  Well appearing overweight middle aged woman,  NAD HEENT: Unremarkable Neck:  6 cm JVD, no thyromegally Lymphatics:  No adenopathy Back:  No CVA tenderness Lungs:  Clear with no wheezes HEART:  Regular rate rhythm, no murmurs, no rubs, no clicks Abd:  soft, positive bowel sounds, no organomegally, no  rebound, no guarding Ext:  2 plus pulses, no edema, no cyanosis, no clubbing Skin:  No rashes no nodules Neuro:  CN II through XII intact, motor grossly intact  EKG - nsr   Assess/Plan:  1. Palpitations - I discussed the findings on her cardiac monitor. I have recommended she take as needed metoprolol. She is to avoid caffeine and ETOH.  2. NS atrial tachy - this appears to be very infrequent. I have asked her to to check her HR and bp when she has symptoms.   Mikle Bosworth.D.

## 2018-10-14 ENCOUNTER — Ambulatory Visit: Payer: Self-pay | Admitting: Neurology

## 2018-10-14 DIAGNOSIS — M1711 Unilateral primary osteoarthritis, right knee: Secondary | ICD-10-CM | POA: Diagnosis not present

## 2018-10-14 DIAGNOSIS — G4733 Obstructive sleep apnea (adult) (pediatric): Secondary | ICD-10-CM | POA: Diagnosis not present

## 2018-10-18 DIAGNOSIS — E669 Obesity, unspecified: Secondary | ICD-10-CM | POA: Diagnosis not present

## 2018-10-18 DIAGNOSIS — G4710 Hypersomnia, unspecified: Secondary | ICD-10-CM | POA: Diagnosis not present

## 2018-10-18 DIAGNOSIS — G4733 Obstructive sleep apnea (adult) (pediatric): Secondary | ICD-10-CM | POA: Diagnosis not present

## 2018-10-18 DIAGNOSIS — G4700 Insomnia, unspecified: Secondary | ICD-10-CM | POA: Diagnosis not present

## 2018-10-21 ENCOUNTER — Ambulatory Visit: Payer: Self-pay

## 2018-10-27 DIAGNOSIS — M1711 Unilateral primary osteoarthritis, right knee: Secondary | ICD-10-CM | POA: Diagnosis not present

## 2018-11-18 DIAGNOSIS — G4733 Obstructive sleep apnea (adult) (pediatric): Secondary | ICD-10-CM | POA: Diagnosis not present

## 2018-12-09 ENCOUNTER — Ambulatory Visit: Payer: Self-pay

## 2018-12-18 DIAGNOSIS — G4733 Obstructive sleep apnea (adult) (pediatric): Secondary | ICD-10-CM | POA: Diagnosis not present

## 2018-12-23 ENCOUNTER — Other Ambulatory Visit: Payer: Self-pay | Admitting: Obstetrics and Gynecology

## 2018-12-23 DIAGNOSIS — N6320 Unspecified lump in the left breast, unspecified quadrant: Secondary | ICD-10-CM

## 2018-12-23 DIAGNOSIS — R102 Pelvic and perineal pain: Secondary | ICD-10-CM | POA: Diagnosis not present

## 2018-12-23 DIAGNOSIS — Z6829 Body mass index (BMI) 29.0-29.9, adult: Secondary | ICD-10-CM | POA: Diagnosis not present

## 2018-12-23 DIAGNOSIS — Z01419 Encounter for gynecological examination (general) (routine) without abnormal findings: Secondary | ICD-10-CM | POA: Diagnosis not present

## 2018-12-23 DIAGNOSIS — Z1231 Encounter for screening mammogram for malignant neoplasm of breast: Secondary | ICD-10-CM | POA: Diagnosis not present

## 2018-12-28 DIAGNOSIS — L820 Inflamed seborrheic keratosis: Secondary | ICD-10-CM | POA: Diagnosis not present

## 2019-01-10 ENCOUNTER — Ambulatory Visit
Admission: RE | Admit: 2019-01-10 | Discharge: 2019-01-10 | Disposition: A | Discharge status: Home / Self Care | Payer: BC Managed Care – PPO | Source: Ambulatory Visit | Attending: Obstetrics and Gynecology | Admitting: Obstetrics and Gynecology

## 2019-01-10 ENCOUNTER — Other Ambulatory Visit: Payer: Self-pay

## 2019-01-10 ENCOUNTER — Other Ambulatory Visit: Payer: Self-pay | Admitting: Obstetrics and Gynecology

## 2019-01-10 DIAGNOSIS — N6320 Unspecified lump in the left breast, unspecified quadrant: Secondary | ICD-10-CM

## 2019-01-10 DIAGNOSIS — N6002 Solitary cyst of left breast: Secondary | ICD-10-CM | POA: Diagnosis not present

## 2019-01-14 DIAGNOSIS — G4733 Obstructive sleep apnea (adult) (pediatric): Secondary | ICD-10-CM | POA: Diagnosis not present

## 2019-01-18 DIAGNOSIS — G4733 Obstructive sleep apnea (adult) (pediatric): Secondary | ICD-10-CM | POA: Diagnosis not present

## 2019-01-31 ENCOUNTER — Encounter: Payer: Self-pay | Admitting: Neurology

## 2019-01-31 ENCOUNTER — Telehealth: Payer: Self-pay | Admitting: *Deleted

## 2019-01-31 ENCOUNTER — Ambulatory Visit: Payer: BC Managed Care – PPO | Admitting: Adult Health

## 2019-01-31 ENCOUNTER — Other Ambulatory Visit: Payer: Self-pay | Admitting: Neurology

## 2019-01-31 ENCOUNTER — Other Ambulatory Visit: Payer: Self-pay

## 2019-01-31 NOTE — Telephone Encounter (Signed)
Patient had an appointment today but arrived late.  She did not want to reschedule at this time.  Wants a call to discuss if or why she needs to be seen?

## 2019-02-04 DIAGNOSIS — F418 Other specified anxiety disorders: Secondary | ICD-10-CM | POA: Diagnosis not present

## 2019-02-04 DIAGNOSIS — M797 Fibromyalgia: Secondary | ICD-10-CM | POA: Diagnosis not present

## 2019-02-04 DIAGNOSIS — G8929 Other chronic pain: Secondary | ICD-10-CM | POA: Diagnosis not present

## 2019-02-04 DIAGNOSIS — I472 Ventricular tachycardia: Secondary | ICD-10-CM | POA: Diagnosis not present

## 2019-02-06 DIAGNOSIS — Z03818 Encounter for observation for suspected exposure to other biological agents ruled out: Secondary | ICD-10-CM | POA: Diagnosis not present

## 2019-02-06 DIAGNOSIS — Z20828 Contact with and (suspected) exposure to other viral communicable diseases: Secondary | ICD-10-CM | POA: Diagnosis not present

## 2019-02-18 DIAGNOSIS — G4733 Obstructive sleep apnea (adult) (pediatric): Secondary | ICD-10-CM | POA: Diagnosis not present

## 2019-03-18 DIAGNOSIS — G4733 Obstructive sleep apnea (adult) (pediatric): Secondary | ICD-10-CM | POA: Diagnosis not present

## 2019-04-08 ENCOUNTER — Other Ambulatory Visit: Payer: Self-pay | Admitting: Internal Medicine

## 2019-04-18 DIAGNOSIS — G4733 Obstructive sleep apnea (adult) (pediatric): Secondary | ICD-10-CM | POA: Diagnosis not present

## 2019-05-06 DIAGNOSIS — M797 Fibromyalgia: Secondary | ICD-10-CM | POA: Diagnosis not present

## 2019-05-06 DIAGNOSIS — F418 Other specified anxiety disorders: Secondary | ICD-10-CM | POA: Diagnosis not present

## 2019-05-18 DIAGNOSIS — G4733 Obstructive sleep apnea (adult) (pediatric): Secondary | ICD-10-CM | POA: Diagnosis not present

## 2019-07-10 ENCOUNTER — Other Ambulatory Visit: Payer: Self-pay | Admitting: Internal Medicine

## 2019-07-14 DIAGNOSIS — R739 Hyperglycemia, unspecified: Secondary | ICD-10-CM | POA: Diagnosis not present

## 2019-07-14 DIAGNOSIS — E785 Hyperlipidemia, unspecified: Secondary | ICD-10-CM | POA: Diagnosis not present

## 2019-07-14 DIAGNOSIS — E559 Vitamin D deficiency, unspecified: Secondary | ICD-10-CM | POA: Diagnosis not present

## 2019-07-14 DIAGNOSIS — Z6828 Body mass index (BMI) 28.0-28.9, adult: Secondary | ICD-10-CM | POA: Diagnosis not present

## 2019-07-14 DIAGNOSIS — Z0000 Encounter for general adult medical examination without abnormal findings: Secondary | ICD-10-CM | POA: Diagnosis not present

## 2019-07-31 DIAGNOSIS — Z03818 Encounter for observation for suspected exposure to other biological agents ruled out: Secondary | ICD-10-CM | POA: Diagnosis not present

## 2019-07-31 DIAGNOSIS — Z20822 Contact with and (suspected) exposure to covid-19: Secondary | ICD-10-CM | POA: Diagnosis not present

## 2019-08-02 ENCOUNTER — Ambulatory Visit: Payer: BC Managed Care – PPO | Admitting: Internal Medicine

## 2019-08-02 ENCOUNTER — Other Ambulatory Visit: Payer: Self-pay

## 2019-08-02 ENCOUNTER — Encounter: Payer: Self-pay | Admitting: Internal Medicine

## 2019-08-02 VITALS — BP 116/72 | HR 85 | Ht 64.5 in | Wt 160.0 lb

## 2019-08-02 DIAGNOSIS — R002 Palpitations: Secondary | ICD-10-CM | POA: Diagnosis not present

## 2019-08-02 DIAGNOSIS — R000 Tachycardia, unspecified: Secondary | ICD-10-CM | POA: Diagnosis not present

## 2019-08-02 MED ORDER — METOPROLOL SUCCINATE ER 25 MG PO TB24: 25.0000 mg | ORAL_TABLET | Freq: Every day | ORAL | 3 refills | Status: DC

## 2019-08-02 NOTE — Patient Instructions (Addendum)
Medication Instructions:  Your physician has recommended you make the following change in your medication:  1 Start taking Metoprolol succinate 25mg  tablet by mouth daily at bedtime.   *If you need a refill on your cardiac medications before your next appointment, please call your pharmacy*  Lab Work: None ordered.  If you have labs (blood work) drawn today and your tests are completely normal, you will receive your results only by: Marland Kitchen MyChart Message (if you have MyChart) OR . A paper copy in the mail If you have any lab test that is abnormal or we need to change your treatment, we will call you to review the results.  Testing/Procedures: None ordered.  Follow-Up: At [facility], you and your health needs are our priority.  As part of our continuing mission to provide you with exceptional heart care, we have created designated Provider Care Teams.  These Care Teams include your primary Cardiologist (physician) and Advanced Practice Providers (APPs -  Physician Assistants and Nurse Practitioners) who all work together to provide you with the care you need, when you need it.  We recommend signing up for the patient portal called "MyChart".  Sign up information is provided on this After Visit Summary.  MyChart is used to connect with patients for Virtual Visits (Telemedicine).  Patients are able to view lab/test results, encounter notes, upcoming appointments, etc.  Non-urgent messages can be sent to your provider as well.   To learn more about what you can do with MyChart, go to [email or web address].    Your next appointment:   Your physician wants you to follow-up in: 6 months with Dr. Lovena Le. You will receive a reminder letter in the mail two months in advance. If you don't receive a letter, please call our office to schedule the follow-up appointment.   Other Instructions:

## 2019-08-02 NOTE — Progress Notes (Signed)
HPI [name] returns today for followup of her palpitations. She is a pleasant 54 yo woman with a h/o PVC"s and prior need for flecainide. She has occaisional episodes of break through palpitations but she describes heart rates of up to 150/min. These occur during physical stress or when she is outside and it is hot. She also notes non-exertional chest pressure. Mid sternal, worse after dinner.  Allergies  Allergen Reactions  . Covera-Hs [Verapamil]     Blood blisters, Stevens-Johnson syndrome  . Crestor [Rosuvastatin Calcium]     Flu like symptoms, on different statin  . Wellbutrin [Bupropion] Palpitations     Current Outpatient Medications  Medication Sig Dispense Refill  . ALPRAZolam (XANAX) 1 MG tablet Take 1 tablet (1 mg total) by mouth at bedtime as needed for anxiety. 30 tablet 0  . atorvastatin (LIPITOR) 20 MG tablet TAKE 1 TABLET BY MOUTH EVERY DAY 90 tablet 3  . cyclobenzaprine (FLEXERIL) 10 MG tablet Take 10 mg by mouth at bedtime as needed.  3  . Eszopiclone 3 MG TABS Take 1 tablet by mouth as needed for sleep.    . metoprolol tartrate (LOPRESSOR) 25 MG tablet TAKE ONE TABLET AS NEEDED FOR PALPITATIONS. UP TO 4 TABLETS IN A 24 HOUR PERIOD. 60 tablet 6  . Milnacipran (SAVELLA) 50 MG TABS tablet Take 1 tablet by mouth 2 (two) times daily.    . Multiple Vitamin (MULTI-VITAMINS) TABS Take by mouth.    . pantoprazole (PROTONIX) 40 MG tablet Take 40 mg by mouth daily.    . predniSONE (DELTASONE) 5 MG tablet Take 1 tablet by mouth as needed. inflammation    . traMADol (ULTRAM) 50 MG tablet Take 1 tablet by mouth daily.    . metoprolol succinate (TOPROL-XL) 25 MG 24 hr tablet Take 1 tablet (25 mg total) by mouth daily. 90 tablet 3   Current Facility-Administered Medications  Medication Dose Route Frequency Provider Last Rate Last Admin  . 0.9 %  sodium chloride infusion  500 mL Intravenous Continuous Irene Shipper, MD         Past Medical History:  Diagnosis Date   . Anxiety   . Arrhythmia ventricular    s/p ablation 2001  . Carpal tunnel syndrome, bilateral   . Chronic RLQ pain   . Dyslipidemia   . Dysrhythmia    V- tach- resolved with ablation 2002  . GERD (gastroesophageal reflux disease)   . Headache   . Hiatal hernia   . History of migraine headaches   . IBS (irritable bowel syndrome)   . Insomnia   . Menorrhagia   . Mitral valve disorder   . MVP (mitral valve prolapse)   . Plantar fasciitis   . PONV (postoperative nausea and vomiting)   . Uterine fibroid    small  . Ventricular tachycardia ([place])     ROS:   All systems reviewed and negative except as noted in the HPI.   Past Surgical History:  Procedure Laterality Date  . ABDOMINAL HYSTERECTOMY    . BILATERAL SALPINGECTOMY Bilateral 12/07/2013   Procedure: BILATERAL SALPINGECTOMY;  Surgeon: Lovenia Kim, MD;  Location: [facility];  Service: Gynecology;  Laterality: Bilateral;  . CARDIAC ELECTROPHYSIOLOGY [place] AND ABLATION  2002  . cspine  05/1999   c5-6  . echocardiogram  2000/2001  . KNEE SURGERY  1998  . PATELLA ARTHROPLASTY    . ROBOTIC ASSISTED TOTAL HYSTERECTOMY N/A 12/07/2013   Procedure: ROBOTIC ASSISTED TOTAL HYSTERECTOMY;  Surgeon: Lovenia Kim, MD;  Location: [facility];  Service: Gynecology;  Laterality: N/A;  . SPINE SURGERY       Family History  Problem Relation Age of Onset  . Hiatal hernia Mother   . Irritable bowel syndrome Mother   . Hypertension Mother   . Thyroid disease Mother   . Heart disease Mother 46       AMI, 90% LAD, stent placement, cardiac arrest  . Lung cancer Father        (+ TOBACCO)  . Hypertension Sister   . Colitis Maternal Grandmother        Crohns VS [facility].C  . Seizures Sister   . Headache Sister   . Pancreatic cancer Paternal Uncle   . Colon cancer Neg Hx      Social History   Socioeconomic History  . Marital status: Married    Spouse name: [name]  . Number of children: 2  . Years of education: 16  . Highest  education level: Bachelor's degree (e.g., BA, AB, BS)  Occupational History  . Occupation: Scientist, water quality: [employer]   Tobacco Use  . Smoking status: Never Smoker  . Smokeless tobacco: Never Used  Vaping Use  . Vaping Use: Never used  Substance and Sexual Activity  . Alcohol use: Yes    Alcohol/week: 5.0 standard drinks    Types: 5 Glasses of wine per week    Comment: occ  . Drug use: No  . Sexual activity: Yes    Partners: Male    Birth control/protection: None    Comment: h/o infertility  Other Topics Concern  . Not on file  Social History Narrative   Patient is married [name]) lives with her husband (2nd marriage, [employer]). Two daughters live with them.  Oldest daughter is grown and lives locally with her husband and children.   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   Patient is right-handed.   Patient drinks one cup of caffeine daily.   Social Determinants of Health   Financial Resource Strain:   . Difficulty of Paying Living Expenses:   Food Insecurity:   . Worried About Charity fundraiser in the Last Year:   . Arboriculturist in the Last Year:   Transportation Needs:   . Film/video editor (Medical):   Marland Kitchen Lack of Transportation (Non-Medical):   Physical Activity:   . Days of Exercise per Week:   . Minutes of Exercise per Session:   Stress:   . Feeling of Stress :   Social Connections:   . Frequency of Communication with Friends and Family:   . Frequency of Social Gatherings with Friends and Family:   . Attends Religious Services:   . Active Member of Clubs or Organizations:   . Attends Archivist Meetings:   Marland Kitchen Marital Status:   Intimate Partner Violence:   . Fear of Current or Ex-Partner:   . Emotionally Abused:   Marland Kitchen Physically Abused:   . Sexually Abused:      BP 116/72   Pulse 85   Ht 5' 4.5" (1.638 m)   Wt 160 lb (72.6  kg)   LMP 12/03/2014   SpO2 99%   BMI 27.04 kg/m   Physical Exam:  Well appearing NAD HEENT: Unremarkable Neck:  No JVD, no thyromegally Lymphatics:  No adenopathy Back:  No CVA tenderness Lungs:  Clear with no wheezes CV: regular rate rhythm, no murmurs, no rubs, no clicks Abd:  soft, positive bowel sounds, no organomegally, no rebound, no guarding Ext:  2 plus pulses, no edema, no cyanosis, no clubbing Skin:  No rashes no nodules Neuro:  CN II through XII intact, motor grossly intact  EKG - NSR  Assess/Plan: 1. Palpitations - these are mostly controlled. I recommended she try toprol xl 25 mg daily with short acting loressor if her symptoms break through.  2. Chest pain - appears non-exertional and non-cardiac. She will continue her current meds. We discussed the symptoms she might experience if her chest pain were to become more serious.  Mikle Bosworth.D.

## 2019-08-05 DIAGNOSIS — J209 Acute bronchitis, unspecified: Secondary | ICD-10-CM | POA: Diagnosis not present

## 2019-08-05 DIAGNOSIS — J019 Acute sinusitis, unspecified: Secondary | ICD-10-CM | POA: Diagnosis not present

## 2019-08-11 DIAGNOSIS — M25561 Pain in right knee: Secondary | ICD-10-CM | POA: Diagnosis not present

## 2019-08-11 DIAGNOSIS — M25562 Pain in left knee: Secondary | ICD-10-CM | POA: Diagnosis not present

## 2019-08-11 DIAGNOSIS — M25761 Osteophyte, right knee: Secondary | ICD-10-CM | POA: Diagnosis not present

## 2019-08-11 DIAGNOSIS — M1711 Unilateral primary osteoarthritis, right knee: Secondary | ICD-10-CM | POA: Diagnosis not present

## 2019-08-11 DIAGNOSIS — M170 Bilateral primary osteoarthritis of knee: Secondary | ICD-10-CM | POA: Diagnosis not present

## 2019-08-18 DIAGNOSIS — M1711 Unilateral primary osteoarthritis, right knee: Secondary | ICD-10-CM | POA: Diagnosis not present

## 2019-08-22 DIAGNOSIS — M1711 Unilateral primary osteoarthritis, right knee: Secondary | ICD-10-CM | POA: Diagnosis not present

## 2019-08-29 DIAGNOSIS — M1711 Unilateral primary osteoarthritis, right knee: Secondary | ICD-10-CM | POA: Diagnosis not present

## 2019-09-05 DIAGNOSIS — M1711 Unilateral primary osteoarthritis, right knee: Secondary | ICD-10-CM | POA: Diagnosis not present

## 2019-09-28 DIAGNOSIS — J342 Deviated nasal septum: Secondary | ICD-10-CM | POA: Diagnosis not present

## 2019-09-28 DIAGNOSIS — G4733 Obstructive sleep apnea (adult) (pediatric): Secondary | ICD-10-CM | POA: Diagnosis not present

## 2019-09-28 DIAGNOSIS — Z9989 Dependence on other enabling machines and devices: Secondary | ICD-10-CM | POA: Diagnosis not present

## 2019-09-28 DIAGNOSIS — K219 Gastro-esophageal reflux disease without esophagitis: Secondary | ICD-10-CM | POA: Diagnosis not present

## 2019-10-14 DIAGNOSIS — H5203 Hypermetropia, bilateral: Secondary | ICD-10-CM | POA: Diagnosis not present

## 2019-10-14 DIAGNOSIS — H52223 Regular astigmatism, bilateral: Secondary | ICD-10-CM | POA: Diagnosis not present

## 2019-10-14 DIAGNOSIS — H524 Presbyopia: Secondary | ICD-10-CM | POA: Diagnosis not present

## 2019-10-24 DIAGNOSIS — R002 Palpitations: Secondary | ICD-10-CM

## 2019-10-24 DIAGNOSIS — R001 Bradycardia, unspecified: Secondary | ICD-10-CM

## 2019-10-28 ENCOUNTER — Ambulatory Visit (INDEPENDENT_AMBULATORY_CARE_PROVIDER_SITE_OTHER): Payer: BC Managed Care – PPO

## 2019-10-28 DIAGNOSIS — R002 Palpitations: Secondary | ICD-10-CM

## 2019-10-28 DIAGNOSIS — R001 Bradycardia, unspecified: Secondary | ICD-10-CM

## 2019-11-09 DIAGNOSIS — R002 Palpitations: Secondary | ICD-10-CM | POA: Diagnosis not present

## 2019-11-09 DIAGNOSIS — R001 Bradycardia, unspecified: Secondary | ICD-10-CM | POA: Diagnosis not present

## 2019-12-15 DIAGNOSIS — F5104 Psychophysiologic insomnia: Secondary | ICD-10-CM | POA: Diagnosis not present

## 2020-02-22 ENCOUNTER — Other Ambulatory Visit: Payer: Self-pay | Admitting: Obstetrics and Gynecology

## 2020-02-22 DIAGNOSIS — Z1231 Encounter for screening mammogram for malignant neoplasm of breast: Secondary | ICD-10-CM

## 2020-02-25 DIAGNOSIS — Z1231 Encounter for screening mammogram for malignant neoplasm of breast: Secondary | ICD-10-CM

## 2020-05-17 ENCOUNTER — Other Ambulatory Visit: Payer: Self-pay

## 2020-05-17 ENCOUNTER — Ambulatory Visit
Admission: RE | Admit: 2020-05-17 | Discharge: 2020-05-17 | Disposition: A | Discharge status: Home / Self Care | Payer: Medicare Other | Source: Ambulatory Visit | Attending: Obstetrics and Gynecology | Admitting: Obstetrics and Gynecology

## 2020-05-17 DIAGNOSIS — Z1231 Encounter for screening mammogram for malignant neoplasm of breast: Secondary | ICD-10-CM

## 2020-07-05 ENCOUNTER — Other Ambulatory Visit: Payer: Self-pay | Admitting: Internal Medicine

## 2020-07-27 ENCOUNTER — Other Ambulatory Visit: Payer: Self-pay | Admitting: Internal Medicine

## 2020-08-05 ENCOUNTER — Other Ambulatory Visit: Payer: Self-pay | Admitting: Internal Medicine

## 2020-08-12 NOTE — Progress Notes (Signed)
PCP:  Harrison Mons, PA Primary Cardiologist: None Electrophysiologist: Cristopher Peru, MD   [name] is a 55 y.o. female seen today for Cristopher Peru, MD for routine electrophysiology followup.  Since last being seen in our clinic the patient reports doing very well. She has not needed any short acting metoprolol. Doesn't feel like she has had any further breakthrough palpitations on the long acting. she denies chest pain, dyspnea, PND, orthopnea, nausea, vomiting, dizziness, syncope, edema, weight gain, or early satiety.  Past Medical History:  Diagnosis Date   Anxiety    Arrhythmia ventricular    s/p ablation 2001   Carpal tunnel syndrome, bilateral    Chronic RLQ pain    Dyslipidemia    Dysrhythmia    V- tach- resolved with ablation 2002   GERD (gastroesophageal reflux disease)    Headache    Hiatal hernia    History of migraine headaches    IBS (irritable bowel syndrome)    Insomnia    Menorrhagia    Mitral valve disorder    MVP (mitral valve prolapse)    Plantar fasciitis    PONV (postoperative nausea and vomiting)    Uterine fibroid    small   Ventricular tachycardia ([place])    Past Surgical History:  Procedure Laterality Date   ABDOMINAL HYSTERECTOMY     BILATERAL SALPINGECTOMY Bilateral 12/07/2013   Procedure: BILATERAL SALPINGECTOMY;  Surgeon: Lovenia Kim, MD;  Location: [facility];  Service: Gynecology;  Laterality: Bilateral;   CARDIAC ELECTROPHYSIOLOGY [place] AND ABLATION  2002   cspine  05/1999   c5-6   echocardiogram  2000/2001   KNEE SURGERY  1998   PATELLA ARTHROPLASTY     ROBOTIC ASSISTED TOTAL HYSTERECTOMY N/A 12/07/2013   Procedure: ROBOTIC ASSISTED TOTAL HYSTERECTOMY;  Surgeon: Lovenia Kim, MD;  Location: [facility];  Service: Gynecology;  Laterality: N/A;   SPINE SURGERY      Current Outpatient Medications  Medication Sig Dispense Refill   ALPRAZolam (XANAX) 1 MG tablet Take 1 tablet (1 mg total) by mouth at bedtime as needed for  anxiety. 30 tablet 0   atorvastatin (LIPITOR) 20 MG tablet TAKE 1 TABLET BY MOUTH EVERY DAY 90 tablet 3   atorvastatin (LIPITOR) 20 MG tablet Take by mouth daily.     Baclofen 5 MG TABS Take 1 tablet by mouth daily.     DULoxetine (CYMBALTA) 30 MG capsule Take 30-60 mg by mouth daily.     hydrocortisone 2.5 % ointment Apply 1 application topically as needed.     metoprolol succinate (TOPROL-XL) 25 MG 24 hr tablet TAKE 1 TABLET (25 MG TOTAL) BY MOUTH DAILY. PLEASE SCHEDULE YEARLY APPOINTMENT FOR FUTURE REFILLS 15 tablet 0   metoprolol tartrate (LOPRESSOR) 25 MG tablet TAKE ONE TABLET AS NEEDED FOR PALPITATIONS. UP TO 4 TABLETS IN A 24 HOUR PERIOD. 60 tablet 6   Multiple Vitamin (MULTI-VITAMINS) TABS Take by mouth.     pantoprazole (PROTONIX) 40 MG tablet Take 40 mg by mouth daily.     QUEtiapine (SEROQUEL) 25 MG tablet Take 25 mg by mouth at bedtime.     traMADol (ULTRAM) 50 MG tablet Take 1 tablet by mouth daily.     Current Facility-Administered Medications  Medication Dose Route Frequency Provider Last Rate Last Admin   0.9 %  sodium chloride infusion  500 mL Intravenous Continuous Irene Shipper, MD        Allergies  Allergen Reactions   Methocarbamol     Other  reaction(s): Redness   Covera-Hs [Verapamil]     Blood blisters, Stevens-Johnson syndrome   Crestor [Rosuvastatin Calcium]     Flu like symptoms, on different statin   Wellbutrin [Bupropion] Palpitations    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[employer]   Tobacco Use   Smoking status: Never   Smokeless tobacco: Never  Vaping Use   Vaping Use: Never used  Substance and Sexual Activity   Alcohol use: Yes    Alcohol/week: 5.0 standard drinks    Types: 5 Glasses of wine  per week    Comment: occ   Drug use: No   Sexual activity: Yes    Partners: Male    Birth control/protection: None    Comment: h/o infertility  Other Topics Concern   Not on file  Social History Narrative   Patient is married [name]) lives with her husband (2nd marriage, [employer]). Two daughters live with them.  Oldest daughter is grown and lives locally with her husband and children.   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   Patient is right-handed.   Patient drinks one cup of caffeine daily.   Social Determinants of Health   Financial Resource Strain: Not on file  Food Insecurity: Not on file  Transportation Needs: Not on file  Physical Activity: Not on file  Stress: Not on file  Social Connections: Not on file  Intimate Partner Violence: Not on file     Review of Systems: General: No chills, fever, night sweats or weight changes  Cardiovascular:  No chest pain, dyspnea on exertion, edema, orthopnea, palpitations, paroxysmal nocturnal dyspnea Dermatological: No rash, lesions or masses Respiratory: No cough, dyspnea Urologic: No hematuria, dysuria Abdominal: No nausea, vomiting, diarrhea, bright red blood per rectum, melena, or hematemesis Neurologic: No visual changes, weakness, changes in mental status All other systems reviewed and are otherwise negative except as noted above.  Physical Exam: Vitals:   08/14/20 0939  BP: 92/60  Pulse: 73  SpO2: 98%  Weight: 149 lb (67.6 kg)  Height: '5\' 4"'$  (1.626 m)    GEN- The patient is well appearing, alert and oriented x 3 today.   HEENT: normocephalic, atraumatic; sclera clear, conjunctiva pink; hearing intact; oropharynx clear; neck supple, no JVP Lymph- no cervical lymphadenopathy Lungs- Clear to ausculation bilaterally, normal work of breathing.  No wheezes, rales, rhonchi Heart- Regular rate and rhythm, no murmurs, rubs or gallops, PMI not  laterally displaced GI- soft, non-tender, non-distended, bowel sounds present, no hepatosplenomegaly Extremities- no clubbing, cyanosis, or edema; DP/PT/radial pulses 2+ bilaterally MS- no significant deformity or atrophy Skin- warm and dry, no rash or lesion Psych- euthymic mood, full affect Neuro- strength and sensation are intact  EKG is not ordered.   Additional studies reviewed include: Previous EP office notes.   Labs care-everywhere 07/20/20 Cr 0.73 K 4.5 WBC 5.0 Hgb 13.0 LFTs WNL  Monitor 11-2019 1. NSR with sinus brady and sinus tachy 2. Noise artifact 3. Rare PAC's and PVC's.   Assessment and Plan:  1. Palpitations  Very well controlled on toprol. Previous monitors have only shown rare PACs and PVCs as  well as rare NS atrial tach.  Was previously on flecainide  2. Chest pain - a Most likely non-exertional and non-cardiac by history No change at this time  Have reviewed alarm symptoms and symptoms concerning for cardiac type pain.   3. Relative hypotension Runs 90-110s. Encouraged to call if any dizziness, lightheadedness, or BPs in 80s.  4. OSA Uses CPAP nightly and with naps  RTC 9 months to see Dr. Lovena Le. Sooner with increasing palpitations.   Shirley Friar, PA-C  08/14/20 9:46 AM

## 2020-08-14 ENCOUNTER — Ambulatory Visit: Payer: Medicare Other | Admitting: Student

## 2020-08-14 ENCOUNTER — Other Ambulatory Visit: Payer: Self-pay

## 2020-08-14 VITALS — BP 92/60 | HR 73 | Ht 64.0 in | Wt 149.0 lb

## 2020-08-14 DIAGNOSIS — R0789 Other chest pain: Secondary | ICD-10-CM

## 2020-08-14 DIAGNOSIS — R002 Palpitations: Secondary | ICD-10-CM | POA: Diagnosis not present

## 2020-08-14 MED ORDER — METOPROLOL TARTRATE 25 MG PO TABS: 25.0000 mg | ORAL_TABLET | ORAL | 6 refills | Status: DC | PRN

## 2020-08-14 MED ORDER — METOPROLOL SUCCINATE ER 25 MG PO TB24: 25.0000 mg | ORAL_TABLET | Freq: Every day | ORAL | 3 refills | Status: DC

## 2020-08-14 NOTE — Patient Instructions (Signed)
Medication Instructions:  Your physician recommends that you continue on your current medications as directed. Please refer to the Current Medication list given to you today. *If you need a refill on your cardiac medications before your next appointment, please call your pharmacy*   Lab Work: None If you have labs (blood work) drawn today and your tests are completely normal, you will receive your results only by: [place] (if you have MyChart) OR A paper copy in the mail If you have any lab test that is abnormal or we need to change your treatment, we will call you to review the results.   Follow-Up: At [facility], you and your health needs are our priority.  As part of our continuing mission to provide you with exceptional heart care, we have created designated Provider Care Teams.  These Care Teams include your primary Cardiologist (physician) and Advanced Practice Providers (APPs -  Physician Assistants and Nurse Practitioners) who all work together to provide you with the care you need, when you need it.   Your next appointment:   9 month(s)  The format for your next appointment:   In Person  Provider:   You may see Cristopher Peru, MD or one of the following Advanced Practice Providers on your designated Care Team:   Tommye Standard, [place] "[facility]" [place], [place]

## 2020-08-20 ENCOUNTER — Ambulatory Visit
Admission: RE | Admit: 2020-08-20 | Discharge: 2020-08-20 | Disposition: A | Discharge status: Home / Self Care | Payer: Medicare Other | Source: Ambulatory Visit | Attending: Family Medicine | Admitting: Family Medicine

## 2020-08-20 ENCOUNTER — Other Ambulatory Visit: Payer: Self-pay | Admitting: Family Medicine

## 2020-08-20 DIAGNOSIS — M545 Low back pain, unspecified: Secondary | ICD-10-CM

## 2020-08-20 DIAGNOSIS — M5489 Other dorsalgia: Secondary | ICD-10-CM

## 2020-10-06 HISTORY — PX: BREAST IMPLANT REMOVAL: SUR1101

## 2020-10-09 ENCOUNTER — Other Ambulatory Visit: Payer: Self-pay | Admitting: Rehabilitation

## 2020-10-09 DIAGNOSIS — M5030 Other cervical disc degeneration, unspecified cervical region: Secondary | ICD-10-CM

## 2020-10-30 ENCOUNTER — Other Ambulatory Visit: Payer: Medicare Other

## 2020-11-09 ENCOUNTER — Other Ambulatory Visit: Payer: Medicare Other

## 2021-01-11 ENCOUNTER — Other Ambulatory Visit: Payer: Medicare Other

## 2021-01-24 ENCOUNTER — Ambulatory Visit
Admission: RE | Admit: 2021-01-24 | Discharge: 2021-01-24 | Disposition: A | Discharge status: Home / Self Care | Payer: 59 | Source: Ambulatory Visit | Attending: Rehabilitation | Admitting: Rehabilitation

## 2021-01-24 ENCOUNTER — Other Ambulatory Visit: Payer: Self-pay

## 2021-01-24 DIAGNOSIS — M5030 Other cervical disc degeneration, unspecified cervical region: Secondary | ICD-10-CM

## 2021-02-01 ENCOUNTER — Other Ambulatory Visit: Payer: Self-pay | Admitting: Rehabilitation

## 2021-02-01 DIAGNOSIS — M47816 Spondylosis without myelopathy or radiculopathy, lumbar region: Secondary | ICD-10-CM

## 2021-02-24 ENCOUNTER — Ambulatory Visit
Admission: RE | Admit: 2021-02-24 | Discharge: 2021-02-24 | Disposition: A | Discharge status: Home / Self Care | Payer: 59 | Source: Ambulatory Visit | Attending: Rehabilitation | Admitting: Rehabilitation

## 2021-02-24 ENCOUNTER — Other Ambulatory Visit: Payer: Self-pay

## 2021-02-24 DIAGNOSIS — M47816 Spondylosis without myelopathy or radiculopathy, lumbar region: Secondary | ICD-10-CM

## 2021-04-19 ENCOUNTER — Ambulatory Visit (INDEPENDENT_AMBULATORY_CARE_PROVIDER_SITE_OTHER): Payer: 59

## 2021-04-19 ENCOUNTER — Ambulatory Visit: Payer: 59 | Admitting: Orthopedic Surgery

## 2021-04-19 ENCOUNTER — Telehealth: Payer: Self-pay

## 2021-04-19 DIAGNOSIS — G8929 Other chronic pain: Secondary | ICD-10-CM

## 2021-04-19 DIAGNOSIS — M1711 Unilateral primary osteoarthritis, right knee: Secondary | ICD-10-CM

## 2021-04-19 DIAGNOSIS — M25561 Pain in right knee: Secondary | ICD-10-CM

## 2021-04-19 NOTE — Telephone Encounter (Signed)
Auth needed for right knee gel injection  

## 2021-04-20 ENCOUNTER — Encounter: Payer: Self-pay | Admitting: Orthopedic Surgery

## 2021-04-20 MED ORDER — LIDOCAINE HCL 1 % IJ SOLN
5.0000 mL | INTRAMUSCULAR | Status: AC | PRN
  Administered 2021-04-19: 5 mL

## 2021-04-20 MED ORDER — METHYLPREDNISOLONE ACETATE 40 MG/ML IJ SUSP
40.0000 mg | INTRAMUSCULAR | Status: AC | PRN
  Administered 2021-04-19: 40 mg via INTRA_ARTICULAR

## 2021-04-20 MED ORDER — BUPIVACAINE HCL 0.25 % IJ SOLN
4.0000 mL | INTRAMUSCULAR | Status: AC | PRN
  Administered 2021-04-19: 4 mL via INTRA_ARTICULAR

## 2021-04-20 NOTE — Progress Notes (Signed)
? ?Office Visit Note ?  ?Patient: [name]           ?Date of Birth: 07-31-1965           ?MRN: [id number] ?Visit Date: 04/19/2021 ?Requested by: Harrison Mons, PA ?[address] ?[place],  [place] [address] ?PCP: Harrison Mons, PA ? ?Subjective: ?Chief Complaint  ?Patient presents with  ? Right Knee - Pain  ? ? ?HPI: [name] is a 56 year old patient with right knee pain.  She underwent patellar stabilizing surgery in 1990.  Has developed pain with ambulation and stairs within the last several years.  Does not report any patellar instability.  Takes over-the-counter medication with some relief. ?             ?ROS: All systems reviewed are negative as they relate to the chief complaint within the history of present illness.  Patient denies  fevers or chills. ? ? ?Assessment & Plan: ?Visit Diagnoses:  ?1. Chronic pain of right knee   ? ? ?Plan: Impression is patellofemoral arthritis.  Plan is cortisone injection today.  Not too much of an effusion.  Patella looks reasonably well centered in the trochlear groove but it does appear to be tracking somewhat laterally getting significant patellofemoral arthritis.  Anatomic factors are not particularly significant for patellar tracking abnormalities in terms of Q angle and tibial tubercle trochlear groove distance based on the plain radiographs.  Patella alto not present.  I think that Adalaya may require surgical intervention in the future in the form of either patellofemoral replacement or total knee replacement.  We will need to thoroughly assess the medial and lateral compartments before making that decision.  In the meantime we will try cortisone injection today and preapproved her for gel injection as knee replacement does appear to be in the distant future at this time.This patient is diagnosed with osteoarthritis of the knee(s).   ? ?Radiographs show evidence of joint space narrowing, osteophytes, subchondral sclerosis and/or subchondral  cysts.  This patient has knee pain which interferes with functional and activities of daily living.   ? ?This patient has experienced inadequate response, adverse effects and/or intolerance with conservative treatments such as acetaminophen, NSAIDS, topical creams, physical therapy or regular exercise, knee bracing and/or weight loss.  ? ?This patient has experienced inadequate response or has a contraindication to intra articular steroid injections for at least 3 months.  ? ?This patient is not scheduled to have a total knee replacement within 6 months of starting treatment with viscosupplementation.  ? ?Follow-Up Instructions: Return if symptoms worsen or fail to improve.  ? ?Orders:  ?Orders Placed This Encounter  ?Procedures  ? XR KNEE 3 VIEW RIGHT  ? ?No orders of the defined types were placed in this encounter. ? ? ? ? Procedures: ?Large Joint Inj: R knee on 04/19/2021 7:44 AM ?Indications: diagnostic evaluation, joint swelling and pain ?Details: 18 G 1.5 in needle, superolateral approach ? ?Arthrogram: No ? ?Medications: 5 mL lidocaine 1 %; 40 mg methylPREDNISolone acetate 40 MG/ML; 4 mL bupivacaine 0.25 % ?Outcome: tolerated well, no immediate complications ?Procedure, treatment alternatives, risks and benefits explained, specific risks discussed. Consent was given by the patient. Immediately prior to procedure a time out was called to verify the correct patient, procedure, equipment, support staff and site/side marked as required. Patient was prepped and draped in the usual sterile fashion.  ? ? ? ? ?Clinical Data: ?No additional findings. ? ?Objective: ?Vital Signs: LMP 12/03/2014  ? ?Physical  Exam:  ? ?Constitutional: Patient appears well-developed ?HEENT:  ?Head: Normocephalic ?Eyes:EOM are normal ?Neck: Normal range of motion ?Cardiovascular: Normal rate ?Pulmonary/chest: Effort normal ?Neurologic: Patient is alert ?Skin: Skin is warm ?Psychiatric: Patient has normal mood and affect ? ? ?Ortho Exam:  Ortho exam demonstrates normal gait and alignment.  There is patellofemoral crepitus on the right compared to the left which is fairly coarse.  Extensor mechanism intact.  No groin pain with internal/external rotation of the leg.  No masses lymphadenopathy or skin changes noted in that right knee region.  Pedal pulses palpable.  Range of motion is 0-1 25.  No patellar apprehension and good patellar stability within the trochlear groove to medial and lateral forces. ? ?Specialty Comments:  ?No specialty comments available. ? ?Imaging: ?XR KNEE 3 VIEW RIGHT ? ?Result Date: 04/20/2021 ?AP lateral merchant radiographs right knee reviewed.  Severe patellofemoral arthritis is present with slight static lateral subluxation of the patella relative to the trochlear groove.  Medial and lateral compartments are well preserved.  Alignment intact.  No medial or lateral compartment spurring present no fracture.  ? ? ?PMFS History: ?Patient Active Problem List  ? Diagnosis Date Noted  ? Palpitations 10/13/2018  ? Sinus tachycardia 10/13/2018  ? Insomnia secondary to chronic pain 07/23/2018  ? OSA on CPAP 07/23/2018  ? Insomnia due to medical condition 07/23/2018  ? Chronic fatigue 07/08/2016  ? Tremor of right hand 09/27/2015  ? Clumsiness 09/27/2015  ? Dysesthesia affecting both sides of body 08/14/2015  ? DDD (degenerative disc disease), cervical 07/03/2015  ? Fibroids, submucosal 12/07/2013  ? Depression with anxiety 10/19/2013  ? Cough 10/19/2013  ? BMI 29.0-29.9,adult 09/07/2013  ? Hyperlipidemia 10/04/2008  ? VENTRICULAR TACHYCARDIA 10/04/2008  ? RLQ PAIN 06/07/2007  ? CARPAL TUNNEL SYNDROME, BILATERAL 05/06/2007  ? Mitral valve disease 05/06/2007  ? ABNORMAL HEART RHYTHMS 05/06/2007  ? GERD 05/06/2007  ? HIATAL HERNIA 05/06/2007  ? IBS 05/06/2007  ? PLANTAR FASCIITIS 05/06/2007  ? ?Past Medical History:  ?Diagnosis Date  ? Anxiety   ? Arrhythmia ventricular   ? s/p ablation 2001  ? Carpal tunnel syndrome, bilateral   ?  Chronic RLQ pain   ? Dyslipidemia   ? Dysrhythmia   ? V- tach- resolved with ablation 2002  ? GERD (gastroesophageal reflux disease)   ? Headache   ? Hiatal hernia   ? History of migraine headaches   ? IBS (irritable bowel syndrome)   ? Insomnia   ? Menorrhagia   ? Mitral valve disorder   ? MVP (mitral valve prolapse)   ? Plantar fasciitis   ? PONV (postoperative nausea and vomiting)   ? Uterine fibroid   ? small  ? Ventricular tachycardia ([place])   ?  ?Family History  ?Problem Relation Age of Onset  ? Hiatal hernia Mother   ? Irritable bowel syndrome Mother   ? Hypertension Mother   ? Thyroid disease Mother   ? Heart disease Mother 17  ?     AMI, 90% LAD, stent placement, cardiac arrest  ? Lung cancer Father   ?     (+ TOBACCO)  ? Hypertension Sister   ? Colitis Maternal Grandmother   ?     Crohns VS [place]  ? Seizures Sister   ? Headache Sister   ? Pancreatic cancer Paternal Uncle   ? Colon cancer Neg Hx   ?  ?Past Surgical History:  ?Procedure Laterality Date  ? ABDOMINAL HYSTERECTOMY    ? BILATERAL SALPINGECTOMY  Bilateral 12/07/2013  ? Procedure: BILATERAL SALPINGECTOMY;  Surgeon: Lovenia Kim, MD;  Location: [facility];  Service: Gynecology;  Laterality: Bilateral;  ? CARDIAC ELECTROPHYSIOLOGY [place] AND ABLATION  2002  ? cspine  05/1999  ? c5-6  ? echocardiogram  2000/2001  ? KNEE SURGERY  1998  ? PATELLA ARTHROPLASTY    ? ROBOTIC ASSISTED TOTAL HYSTERECTOMY N/A 12/07/2013  ? Procedure: ROBOTIC ASSISTED TOTAL HYSTERECTOMY;  Surgeon: Lovenia Kim, MD;  Location: [facility];  Service: Gynecology;  Laterality: N/A;  ? SPINE SURGERY    ? ?Social History  ? ?Occupational History  ? Occupation: Oncologist  ?  Employer: [employer]   ?Tobacco Use  ? Smoking status: Never  ? Smokeless tobacco: Never  ?Vaping Use  ? Vaping Use: Never used  ?Substance and Sexual Activity  ? Alcohol use: Yes  ?  Alcohol/week: 5.0 standard drinks  ?  Types: 5 Glasses of wine per week  ?  Comment: occ  ? Drug use: No  ?  Sexual activity: Yes  ?  Partners: Male  ?  Birth control/protection: None  ?  Comment: h/o infertility  ? ? ? ? ? ? ?

## 2021-04-23 NOTE — Telephone Encounter (Signed)
Noted  

## 2021-05-07 ENCOUNTER — Encounter: Payer: Self-pay | Admitting: Neurology

## 2021-05-13 ENCOUNTER — Encounter: Payer: Self-pay | Admitting: Neurology

## 2021-05-13 ENCOUNTER — Ambulatory Visit: Payer: 59 | Admitting: Neurology

## 2021-05-13 VITALS — BP 116/66 | HR 65 | Ht 64.0 in | Wt 164.5 lb

## 2021-05-13 DIAGNOSIS — G4733 Obstructive sleep apnea (adult) (pediatric): Secondary | ICD-10-CM | POA: Diagnosis not present

## 2021-05-13 DIAGNOSIS — I059 Rheumatic mitral valve disease, unspecified: Secondary | ICD-10-CM | POA: Diagnosis not present

## 2021-05-13 DIAGNOSIS — G9332 Myalgic encephalomyelitis/chronic fatigue syndrome: Secondary | ICD-10-CM | POA: Diagnosis not present

## 2021-05-13 DIAGNOSIS — R5382 Chronic fatigue, unspecified: Secondary | ICD-10-CM

## 2021-05-13 DIAGNOSIS — M797 Fibromyalgia: Secondary | ICD-10-CM

## 2021-05-13 DIAGNOSIS — Z9989 Dependence on other enabling machines and devices: Secondary | ICD-10-CM

## 2021-05-13 MED ORDER — BACLOFEN 5 MG PO TABS: 1.0000 | ORAL_TABLET | ORAL | 1 refills | Status: DC | PRN

## 2021-05-13 NOTE — Patient Instructions (Signed)
Myofascial Pain Syndrome and Fibromyalgia ?Myofascial pain syndrome and fibromyalgia are both pain disorders. You may feel this pain mainly in your muscles. ?Myofascial pain syndrome: ?Always has tender points in the muscles that will cause pain when pressed (trigger points). The pain may come and go. ?Usually affects your neck, upper back, and shoulder areas. The pain often moves into your arms and hands. ?Fibromyalgia: ?Has muscle pains and tenderness that come and go. ?Is often associated with tiredness (fatigue) and sleep problems. ?Has trigger points. ?Tends to be long-lasting (chronic), but is not life-threatening. ?Fibromyalgia and myofascial pain syndrome are not the same. However, they often occur together. If you have both conditions, each can make the other worse. Both are common and can cause enough pain and fatigue to make day-to-day activities difficult. Both can be hard to diagnose because their symptoms are common in many other conditions. ?What are the causes? ?The exact causes of these conditions are not known. ?What increases the risk? ?You are more likely to develop either of these conditions if: ?You have a family history of the condition. ?You are female. ?You have certain triggers, such as: ?Spine disorders. ?An injury (trauma) or other physical stressors. ?Being under a lot of stress. ?Medical conditions such as osteoarthritis, rheumatoid arthritis, or lupus. ?What are the signs or symptoms? ?Fibromyalgia ?The main symptom of fibromyalgia is widespread pain and tenderness in your muscles. Pain is sometimes described as stabbing, shooting, or burning. ?You may also have: ?Tingling or numbness. ?Sleep problems and fatigue. ?Problems with attention and concentration (fibro fog). ?Other symptoms may include: ?Bowel and bladder problems. ?Headaches. ?Vision problems. ?Sensitivity to odors and noises. ?Depression or mood changes. ?Painful menstrual periods (dysmenorrhea). ?Dry skin or eyes. ?These  symptoms can vary over time. ?Myofascial pain syndrome ?Symptoms of myofascial pain syndrome include: ?Tight, ropy bands of muscle. ?Uncomfortable sensations in muscle areas. These may include aching, cramping, burning, numbness, tingling, and weakness. ?Difficulty moving certain parts of the body freely (poor range of motion). ?How is this diagnosed? ?This condition may be diagnosed by your symptoms and medical history. You will also have a physical exam. In general: ?Fibromyalgia is diagnosed if you have pain, fatigue, and other symptoms for more than 3 months, and symptoms cannot be explained by another condition. ?Myofascial pain syndrome is diagnosed if you have trigger points in your muscles, and those trigger points are tender and cause pain elsewhere in your body (referred pain). ?How is this treated? ?Treatment for these conditions depends on the type that you have. ?For fibromyalgia a healthy lifestyle is the most important treatment including aerobic and strength exercises. Different types of medicines are used to help treat pain and include: ?NSAIDs. ?Medicines for treating depression. ?Medicines that help control seizures. ?Medicines that relax the muscles. ?Treatment for myofascial pain syndrome includes: ?Pain medicines, such as NSAIDs. ?Cooling and stretching of muscles. ?Massage therapy with myofascial release technique. ?Trigger point injections. ?Treating these conditions often requires a team of health care providers. These may include: ?Your primary care provider. ?A physical therapist. ?Complementary health care providers, such as massage therapists or acupuncturists. ?A psychiatrist for cognitive behavioral therapy. ?Follow these instructions at home: ?Medicines ?Take over-the-counter and prescription medicines only as told by your health care provider. ?Ask your health care provider if the medicine prescribed to you: ?Requires you to avoid driving or using machinery. ?Can cause constipation.  You may need to take these actions to prevent or treat constipation: ?Drink enough fluid to keep your urine pale   yellow. ?Take over-the-counter or prescription medicines. ?Eat foods that are high in fiber, such as beans, whole grains, and fresh fruits and vegetables. ?Limit foods that are high in fat and processed sugars, such as fried or sweet foods. ?Lifestyle ? ?Do exercises as told by your health care provider or physical therapist. ?Practice relaxation techniques to control your stress. You may want to try: ?Biofeedback. ?Visual imagery. ?Hypnosis. ?Muscle relaxation. ?Yoga. ?Meditation. ?Maintain a healthy lifestyle. This includes eating a healthy diet and getting enough sleep. ?Do not use any products that contain nicotine or tobacco. These products include cigarettes, chewing tobacco, and vaping devices, such as e-cigarettes. If you need help quitting, ask your health care provider. ?General instructions ?Talk to your health care provider about complementary treatments, such as acupuncture or massage. ?Do not do activities that stress or strain your muscles. This includes repetitive motions and heavy lifting. ?Keep all follow-up visits. This is important. ?Where to find support ?Consider joining a support group with others who are diagnosed with this condition. ?National Fibromyalgia Association: www.fmaware.org ?Where to find more information ?American Chronic Pain Association: www.theacpa.org ?Contact a health care provider if: ?You have new symptoms. ?Your symptoms get worse or your pain is severe. ?You have side effects from your medicines. ?You have trouble sleeping. ?Your condition is causing depression or anxiety. ?Get help right away if: ?You have thoughts of hurting yourself or others. ?Get help right awayif you feel like you may hurt yourself or others, or have thoughts about taking your own life. Go to your nearest emergency room or: ?Call 911. ?Call the National Suicide Prevention Lifeline at  1-800-273-8255 or 988. This is open 24 hours a day. ?Text the Crisis Text Line at 741741. ?Summary ?Myofascial pain syndrome and fibromyalgia are pain disorders. ?Myofascial pain syndrome has tender points in the muscles that will cause pain when pressed (trigger points). Fibromyalgia also has muscle pains and tenderness that come and go, but this condition is often associated with fatigue and sleep disturbances. ?Fibromyalgia and myofascial pain syndrome are not the same but often occur together, causing pain and fatigue that make day-to-day activities difficult. ?Follow your health care provider's instructions for taking medicines and maintaining a healthy lifestyle. ?This information is not intended to replace advice given to you by your health care provider. Make sure you discuss any questions you have with your health care provider. ?Document Revised: 11/23/2020 Document Reviewed: 11/23/2020 ?Elsevier Patient Education ? 2023 Elsevier Inc. ? ?

## 2021-05-13 NOTE — Progress Notes (Signed)
New order faxed to DME: [facility]   [phone]  ? ?Fax confirmation received  ?

## 2021-05-13 NOTE — Progress Notes (Signed)
?SLEEP MEDICINE CLINIC ? ? ?Provider:  Larey Seat, MD  ? ?Primary Care Physician:  Harrison Mons, PA ? ? ?Referring Provider: Harrison Mons, PA  ? ? ?Chief Complaint  ?Patient presents with  ? Follow-up  ?  Rm 10, alone. Here to f/u for CPAP. Pt reports doing well on CPAP. Pt wondering if pressure is correct. Pt started taking pregablin and feels like she has slept better.   ? Obstructive Sleep Apnea; HPI:  Fredrick Dray is a 56 y.o. female who used to be seen for Migraines, and wants [place] to follow for sleep apnea. She had a HST through [place], and was prescribed CPAP. She frequently wakes still up, she had through [facility] been scheduled for ONO. Apparently abnormal result, as she was furbished with an oxygen concentrator.   ? ?Interval history from 05-13-2021:  ? ?This is a visit with a previously established OSA on CPAP patient., Tiondra Fang. [place]. A  patient with OSA on CPAP , Insomnia related to arthritic and fibromyalgia pain. Her memory has changed, she reports. ?She has been placed on Lyrica, had been on Cymbalta, Tramadol, Baclofen, Seroquel, etc. Much happier on Lyrica at night. Dr Jeral Fruit was her "fibro -doc" ?Her insomnia was last addressed by Harrison Mons, PA . ?Last sleep study had shown fragmented sleep. She remains a compliant and happy CPAP user. Husband is happy that her snoring is controlled.  ?She was set up in 07-03-2017. We lost touch during the pandemic and she is here as an established patient. ?  ? ?Face to face -Interval history following a sleep study from 07-15-2018  ?The patient just underwent a Sleep study, an upright MRI and had an ED visit.  ?She had ventricular tachycardia and went to ED, had been on Flecainide in the past.  ?Her sleep study confirmed the current CPAP settings as effective.  ?"I didn't snore before my anterior cervical fusion" ? ?POLYSOMNOGRAPHY IMPRESSION :  ? ?Severe Obstructive Sleep Apnea (OSA) at AHI 47.3/h with strong supine accentuation.   ?Lack of  REM sleep while apnea untreated. No prolonged hypoxemia. 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  ?With initiation of CPAP, there was a benefit at 8, 9 and 10 cm water pressure and improvement of sleep efficiency. REM sleep rebounded. The patient slept from 1.20 AM through about 3.50 AM, but was unable to initiate sleep again after dream sleep. Each REM episode let to direct arousal into wakefulness- none of the observed physiological parameter triggered this arousal- it was spontaneous.  ? ?RECOMMENDATIONS: ? ?Yes, there is severe obstructive sleep apnea, but no need for oxygen - the PSG did not show prolonged hypoxia of sleep. The best tolerated positive airway pressure was indeed 8-10 cm water CPAP.  ?I cannot explain the cause of insomnia with these results. Chronic insomnia can be related to chronic pain and in that case would need pain clinic follow up.  ? 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  ?patient has undergone neurocognitive testing and has a fibromyalgia specialist in [place].  ?She has been fatigued and feels often cognitively foggy.  ?She is always having pain in head and neck  ? ? ? ?03-18-2018/ ?HPI:  Lataunya Ruud is a 56 y.o. female who used to be seen for Migraines, and wants [place] to follow for sleep apnea. She had a HST through [place], and was prescribed CPAP. She frequently wakes still up, she had through [facility] been scheduled for ONO. Apparently abnormal  result, as she was furbished with an oxygen concentrator.  ?She is followed by Dr. Coletta Memos and Quay Burow, [place].  Carried a diagnosis of insomnia, chronic and migraine, fibromyalgia, and now OSA. ? ?She had the following tests- I am not privy to the data, but the 05-2017 HST documented an AHI of 30.1/ h, )2 Nadir was 78%. Length of desaturation was 16 seconds.  ?She was placed on autotitration- 5 through 15 cm water , 3 cm EPR - AHI of 1.2 /h and ONO July 2019, 21 minutes total desat. Time, desaturation 9/h.   ? ?CPAP compliance - 97 % , Mrs. have to [facility] compliance report shows 97% compliance for over 4 hours of daily use with an average 8 hours 57 minutes of use.  The AutoSet provides pressure between 5 and 15 cmH2O the 3 cm expiratory pressure relief.  She does not have central apneas emerging her residual AHI is 1.2 which is an excellent resolution of obstructive sleep apnea she has minimal air leaks, the 95th percentile pressure is 8.4 cmH2O.   ? ?We could discuss reducing the pressure, but since this is an auto titration device she will not be exposed to higher pressures in her body demands.  Using CPAP has benefited the patient's headache marginally-still has a HA frequency of almost daily. ?She wakes up often with headaches however in the middle of the night.  ? ?Chief complaint according to patient : "Insomnia, sleeping 1-2 hours, can barely function." I am fatigued but not sleepy". Sleep perception disorder?  ? ?Sleep habits are as follows:  ?Dinnertime for the patient is usually 6 PM, she will spend the evening with relaxing activities sometimes a walk sometimes TV, sometimes reading.  Bedtime will be around midnight, the bedroom is cool, quiet and dark.  She shares a bedroom with her husband who has witnessed her apneas and snoring before she was treated with CPAP.  Mostly she sleeps on her sides but since she is using CPAP she has begun more sleeping in supine as not to dislodge the mask. She likes the dream wear mask. Sleeps on 2 pillows. ?Most evenings her sleep latency exceeds an hour, and once she is asleep she feels that she only can sleep 2 to 3 hours before waking up again.  She suspects that discomfort is part of this since her apnea is treated. ?Any arousal from sleep leads to a prolonged, protracted period of wakefulness unable to return to sleep. ?If she is lucky if she feels that she may get another 2 hours of sleep after that.  It is her estimate that she only sleeps about 4 hours at night and  that the high compliance of CPAP use of 8 hours 57 minutes consist mostly of being wakeful with his CPAP in place.  She has to rise in the morning at 8:30 AM, she can work from home. ? ?Sleep medical history: V tach status posy cardiac ablation, fusion in 2002, cervical spine 5-6-7.  After her hysterectomy in the year 2015 she had difficulties starting to breathe on her own after extubation and to breathe deep enough, presenting with hypopnea and hypoxemia. ?Migraine, Neck and shoulder pain patient who was given the diagnosis of fibromyalgia at the [facility] in [place].  ? ?Family sleep history: insomnia - father. Brother, sister snore loudly as does mother.  ? ?Social history: Married, 54 and 20 year old children. ?She may have 4 or 5 glasses of wine with dinner per week at the  most, she is a non-smoker nontobacco user, caffeine use 1 cup of coffee in the morning not every morning.  No sodas, no ice tea or hot tea. ? ? ? ? ?Review of Systems:  Heat and cold sensitive, flushing, feeling the windpipe is tight.  ?Out of a complete 14 system review, the patient complains of only the following symptoms, and all other reviewed systems are negative. ? Anxiety, pain, aches, and medication resistant insomnia.  ? ?The patient endorsed today the Epworth sleepiness score at one-point and fatigue severity at 47 out of 63 points.  High degree fatigue severity can be related to hypoxia at sleep but is most commonly seen with chronic pain conditions or autoimmune conditions. ? ? ?" I didn't snore before my anterior cervical fusion"  ? ? ?CPAP compliance download: 07-26-2018, ?The patient is 100% compliant CPAP user including the data from last night 25 July 2018 visit average user time 8 hours and 31 minutes.  She is using AutoSet currently between 5 and 15 cmH2O was 2 cm EPR I have already sent an order to change so simply decreased the window starting higher and ending lower.  There are some significant air leaks some  nights but these of peak air leaks when the mask is dislodged.  The 95th percentile pressure is 8.6 cmH2O and her residual AHI is 1.4/ h which is excellent resolution there are no central apneas arising. ? ?

## 2021-05-24 ENCOUNTER — Encounter: Payer: Self-pay | Admitting: Internal Medicine

## 2021-05-24 ENCOUNTER — Ambulatory Visit: Payer: 59 | Admitting: Internal Medicine

## 2021-05-24 VITALS — BP 100/62 | HR 64 | Ht 64.0 in | Wt 168.0 lb

## 2021-05-24 DIAGNOSIS — R002 Palpitations: Secondary | ICD-10-CM

## 2021-05-24 NOTE — Patient Instructions (Signed)
Medication Instructions:  ?Your physician recommends that you continue on your current medications as directed. Please refer to the Current Medication list given to you today. ? ?Labwork: ?None ordered. ? ?Testing/Procedures: ?None ordered. ? ?Follow-Up: ? ?Your physician wants you to follow-up in: one year with Gregg Taylor, MD or one of the following Advanced Practice Providers on your designated Care Team:   ?Renee Ursuy, PA-C ?Michael "Andy" Tillery, PA-C ? ? ?Any Other Special Instructions Will Be Listed Below (If Applicable). ? ?If you need a refill on your cardiac medications before your next appointment, please call your pharmacy.  ? ?Important Information About Sugar ? ? ? ? ? ? ? ?

## 2021-05-24 NOTE — Progress Notes (Signed)
HPI [name] returns today for followup of her palpitations. She is a pleasant 57 yo woman with a h/o PVC"s and prior need for flecainide. Most recently she has been well controlled using long and short acting metoprolol. She rarely has to use the short acting metoprolol. She had lost 40 lbs but then developed some back problems and was placed on lyrica and has gained back 15 lbs. She admits to some dietary indiscretion.  Allergies  Allergen Reactions   Methocarbamol     Other reaction(s): Redness   Covera-Hs [Verapamil]     Blood blisters, Stevens-Johnson syndrome   Crestor [Rosuvastatin Calcium]     Flu like symptoms, on different statin   Wellbutrin [Bupropion] Palpitations     Current Outpatient Medications  Medication Sig Dispense Refill   ALPRAZolam (XANAX) 1 MG tablet Take 1 tablet (1 mg total) by mouth at bedtime as needed for anxiety. 30 tablet 0   atorvastatin (LIPITOR) 20 MG tablet TAKE 1 TABLET BY MOUTH EVERY DAY 90 tablet 3   atorvastatin (LIPITOR) 20 MG tablet Take by mouth daily.     Baclofen 5 MG TABS Take 1 tablet by mouth as needed. 90 tablet 1   DULoxetine (CYMBALTA) 30 MG capsule Take 30-60 mg by mouth daily.     metoprolol succinate (TOPROL-XL) 25 MG 24 hr tablet Take 1 tablet (25 mg total) by mouth daily. 90 tablet 3   metoprolol tartrate (LOPRESSOR) 25 MG tablet Take 1 tablet (25 mg total) by mouth as needed (for Palpitations). Up to 4 tables in a 24 hour period. 60 tablet 6   Multiple Vitamin (MULTI-VITAMINS) TABS Take by mouth.     pantoprazole (PROTONIX) 40 MG tablet Take 40 mg by mouth daily.     pregabalin (LYRICA) 75 MG capsule Take 1-2 capsules by mouth 2 (two) times daily. 1 capsule in the AM and 2 capsules at night, an hr before bedtime.     traMADol (ULTRAM) 50 MG tablet Take 1 tablet by mouth daily as needed.     Current Facility-Administered Medications  Medication Dose Route Frequency Provider Last Rate Last Admin   0.9 %  sodium  chloride infusion  500 mL Intravenous Continuous Irene Shipper, MD         Past Medical History:  Diagnosis Date   Anxiety    Arrhythmia ventricular    s/p ablation 2001   Carpal tunnel syndrome, bilateral    Chronic RLQ pain    Dyslipidemia    Dysrhythmia    V- tach- resolved with ablation 2002   GERD (gastroesophageal reflux disease)    Headache    Hiatal hernia    History of migraine headaches    IBS (irritable bowel syndrome)    Insomnia    Menorrhagia    Mitral valve disorder    MVP (mitral valve prolapse)    Plantar fasciitis    PONV (postoperative nausea and vomiting)    Uterine fibroid    small   Ventricular tachycardia ([place])     ROS:   All systems reviewed and negative except as noted in the HPI.   Past Surgical History:  Procedure Laterality Date   ABDOMINAL HYSTERECTOMY     BILATERAL SALPINGECTOMY Bilateral 12/07/2013   Procedure: BILATERAL SALPINGECTOMY;  Surgeon: Lovenia Kim, MD;  Location: [facility];  Service: Gynecology;  Laterality: Bilateral;   CARDIAC ELECTROPHYSIOLOGY MAPPING AND ABLATION  2002   cspine  05/1999   c5-6   echocardiogram  2000/2001   KNEE SURGERY  1998   PATELLA ARTHROPLASTY     ROBOTIC ASSISTED TOTAL HYSTERECTOMY N/A 12/07/2013   Procedure: ROBOTIC ASSISTED TOTAL HYSTERECTOMY;  Surgeon: Lovenia Kim, MD;  Location: [facility];  Service: Gynecology;  Laterality: N/A;   SPINE SURGERY       Family History  Problem Relation Age of Onset   Hiatal hernia Mother    Irritable bowel syndrome Mother    Hypertension Mother    Thyroid disease Mother    Heart disease Mother 38       AMI, 90% LAD, stent placement, cardiac arrest   Lung cancer Father        (+ TOBACCO)   Hypertension Sister    Colitis Maternal Grandmother        Crohns VS [facility].C   Seizures Sister    Headache Sister    Pancreatic cancer Paternal Uncle    Colon cancer Neg Hx      Social History   Socioeconomic History   Marital status: Married    Spouse name:  [name]   Number of children: 2   Years of education: 16   Highest education level: Bachelor's degree (e.g., BA, AB, BS)  Occupational History   Occupation: Scientist, water quality: [employer]   Tobacco Use   Smoking status: Never   Smokeless tobacco: Never  Vaping Use   Vaping Use: Never used  Substance and Sexual Activity   Alcohol use: Yes    Alcohol/week: 5.0 standard drinks    Types: 5 Glasses of wine per week    Comment: occ   Drug use: No   Sexual activity: Yes    Partners: Male    Birth control/protection: None    Comment: h/o infertility  Other Topics Concern   Not on file  Social History Narrative   Patient is married [name]) lives with her husband (2nd marriage, [employer]). Two daughters live with them.  Oldest daughter is grown and lives locally with her husband and children.   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   Patient is right-handed.   Patient drinks one cup of caffeine daily.   Social Determinants of Health   Financial Resource Strain: Not on file  Food Insecurity: Not on file  Transportation Needs: Not on file  Physical Activity: Not on file  Stress: Not on file  Social Connections: Not on file  Intimate Partner Violence: Not on file     LMP 12/03/2014   Physical Exam:  Well appearing middle aged woman, NAD HEENT: Unremarkable Neck:  No JVD, no thyromegally Lymphatics:  No adenopathy Back:  No CVA tenderness Lungs:  Clear with no wheezes HEART:  Regular rate rhythm, no murmurs, no rubs, no clicks Abd:  soft, positive bowel sounds, no organomegally, no rebound, no guarding Ext:  2 plus pulses, no edema, no cyanosis, no clubbing Skin:  No rashes no nodules Neuro:  CN II through XII intact, motor grossly intact  EKG - nsr   Assess/Plan:  Palpitations - her symptoms are controlled. She will continue the low dose beta blocker and is  encouraged to avoid caffeine and ETOH.  Weight gain - she is encouraged to lose weight. We discussed intermittent fasting. Carleene Overlie Darrion Macaulay,MD

## 2021-07-22 ENCOUNTER — Telehealth: Payer: Self-pay

## 2021-07-22 NOTE — Telephone Encounter (Signed)
VOB pending for Durolane, right knee.

## 2021-07-26 ENCOUNTER — Other Ambulatory Visit: Payer: Self-pay

## 2021-07-26 DIAGNOSIS — M1711 Unilateral primary osteoarthritis, right knee: Secondary | ICD-10-CM

## 2021-08-08 ENCOUNTER — Ambulatory Visit: Payer: 59 | Admitting: Orthopedic Surgery

## 2021-08-08 ENCOUNTER — Encounter: Payer: Self-pay | Admitting: Orthopedic Surgery

## 2021-08-08 DIAGNOSIS — G8929 Other chronic pain: Secondary | ICD-10-CM

## 2021-08-08 DIAGNOSIS — M1711 Unilateral primary osteoarthritis, right knee: Secondary | ICD-10-CM

## 2021-08-08 DIAGNOSIS — M25561 Pain in right knee: Secondary | ICD-10-CM

## 2021-08-10 ENCOUNTER — Encounter: Payer: Self-pay | Admitting: Orthopedic Surgery

## 2021-08-10 DIAGNOSIS — M1711 Unilateral primary osteoarthritis, right knee: Secondary | ICD-10-CM | POA: Diagnosis not present

## 2021-08-10 MED ORDER — SODIUM HYALURONATE 60 MG/3ML IX PRSY
60.0000 mg | PREFILLED_SYRINGE | INTRA_ARTICULAR | Status: AC | PRN
  Administered 2021-08-10: 60 mg via INTRA_ARTICULAR

## 2021-08-10 MED ORDER — LIDOCAINE HCL 1 % IJ SOLN
5.0000 mL | INTRAMUSCULAR | Status: AC | PRN
  Administered 2021-08-10: 5 mL

## 2021-08-10 MED ORDER — BUPIVACAINE HCL 0.25 % IJ SOLN
4.0000 mL | INTRAMUSCULAR | Status: AC | PRN
  Administered 2021-08-10: 4 mL via INTRA_ARTICULAR

## 2021-08-10 NOTE — Progress Notes (Signed)
   Procedure Note  Patient: [name]             Date of Birth: 21-Nov-1965           MRN: [id number]             Visit Date: 08/08/2021  Procedures: Visit Diagnoses:  1. Chronic pain of right knee     Large Joint Inj: R knee on 08/10/2021 12:28 PM Indications: diagnostic evaluation, joint swelling and pain Details: 18 G 1.5 in needle, superolateral approach  Arthrogram: No  Medications: 5 mL lidocaine 1 %; 4 mL bupivacaine 0.25 %; 60 mg Sodium Hyaluronate 60 MG/3ML Outcome: tolerated well, no immediate complications Procedure, treatment alternatives, risks and benefits explained, specific risks discussed. Consent was given by the patient. Immediately prior to procedure a time out was called to verify the correct patient, procedure, equipment, support staff and site/side marked as required. Patient was prepped and draped in the usual sterile fashion.    [name] is a 56 year old patient with right knee pain.  Pain limits her.  Cannot do certain exercises.  Has pain going up and down stairs.  Most of her pain is around the patella.  No instability since her knee surgery about 20 years ago.  Plan at this time is Durolane injection today for pain relief.  MRI right knee to evaluate tibial tubercle trochlear groove distance as well as the amount of arthritis present.  Want to see if there is some type of not arthroplasty option for the knee and if there is not then we will have to decide about patellofemoral placement versus total knee replacement 6 months from now.  Follow-up after that study.

## 2021-08-21 ENCOUNTER — Other Ambulatory Visit: Payer: Self-pay | Admitting: Student

## 2021-08-22 ENCOUNTER — Ambulatory Visit
Admission: RE | Admit: 2021-08-22 | Discharge: 2021-08-22 | Disposition: A | Discharge status: Home / Self Care | Payer: 59 | Source: Ambulatory Visit | Attending: Orthopedic Surgery | Admitting: Orthopedic Surgery

## 2021-08-22 DIAGNOSIS — G8929 Other chronic pain: Secondary | ICD-10-CM

## 2021-08-25 NOTE — Progress Notes (Signed)
Can u make f u appt

## 2021-08-28 ENCOUNTER — Ambulatory Visit: Payer: 59 | Admitting: Orthopedic Surgery

## 2021-08-28 ENCOUNTER — Encounter: Payer: Self-pay | Admitting: Orthopedic Surgery

## 2021-08-28 DIAGNOSIS — M1711 Unilateral primary osteoarthritis, right knee: Secondary | ICD-10-CM

## 2021-08-28 NOTE — Progress Notes (Signed)
Office Visit Note   Patient: [name]           Date of Birth: 03-May-1965           MRN: [id number] Visit Date: 08/28/2021 Requested by: Harrison Mons, [place],  [place] [id number] PCP: Harrison Mons, PA  Subjective: Chief Complaint  Patient presents with   Right Knee - Pain    HPI: Is a 56 year old patient with right knee pain.  Since she was last seen she has had an MRI scan.  Had an injection 3 weeks ago which really did not help much.  Hard for her to hike.  Cannot do exercise classes.  He is okay with swimming.  She retired early.  Has grandchildren.  Pain does not wake her from sleep at night.  MRI scan is reviewed.  Severe patellofemoral arthritis is present with significant loss of normal bony contours on the lateral facet of the patella.  There is also significant lateral maltracking of the patella with no increase in tibial tubercle trochlear groove distance.  Medial compartment degenerative changes are also present with a degenerative medial meniscal tear horizontal cleavage type.  Lateral compartment appears spared.              ROS: All systems reviewed are negative as they relate to the chief complaint within the history of present illness.  Patient denies  fevers or chills.   Assessment & Plan: Visit Diagnoses:  1. Arthritis of right knee     Plan: Impression is right knee pain with severe patellofemoral arthritis and evidence of maltracking on MRI axial cuts.  I think it would be difficult to Obtain a well centered patella in the trochlear groove with patellofemoral replacement.  Also concerned about degenerative changes in the medial compartment.  Best option for pain relief for Latosha would be total knee replacement.  Would likely consider the Stryker total knee replacement due to patellofemoral mechanics of the implant.  Risk and benefits of knee replacement are discussed.  I am not sure if she is really there clinically to  undergo that procedure.  The rigorous nature of the rehabilitative process also discussed.  Patient understands the general rationale for her decision making today.  She will let me know if she wants to proceed with surgery.  Follow-up as needed.  Follow-Up Instructions: No follow-ups on file.   Orders:  No orders of the defined types were placed in this encounter.  No orders of the defined types were placed in this encounter.     Procedures: No procedures performed   Clinical Data: No additional findings.  Objective: Vital Signs: LMP 12/03/2014   Physical Exam:   Constitutional: Patient appears well-developed HEENT:  Head: Normocephalic Eyes:EOM are normal Neck: Normal range of motion Cardiovascular: Normal rate Pulmonary/chest: Effort normal Neurologic: Patient is alert Skin: Skin is warm Psychiatric: Patient has normal mood and affect  Ortho Exam: Ortho exam demonstrates good range of motion of the right knee.  No increased Q angle.  Collateral crucial ligaments are stable.  Patellofemoral crepitus is present.  Patient has medial and lateral joint line tenderness.  No masses lymphadenopathy or skin changes noted in that knee region.  Specialty Comments:  No specialty comments available.  Imaging: No results found.   PMFS History: Patient Active Problem List   Diagnosis Date Noted   Palpitations 10/13/2018   Sinus tachycardia 10/13/2018   Insomnia secondary to chronic pain 07/23/2018   OSA  on CPAP 07/23/2018   Insomnia due to medical condition 07/23/2018   Chronic fatigue 07/08/2016   Tremor of right hand 09/27/2015   Clumsiness 09/27/2015   Dysesthesia affecting both sides of body 08/14/2015   DDD (degenerative disc disease), cervical 07/03/2015   Fibroids, submucosal 12/07/2013   Depression with anxiety 10/19/2013   Cough 10/19/2013   BMI 29.0-29.9,adult 09/07/2013   Hyperlipidemia 10/04/2008   VENTRICULAR TACHYCARDIA 10/04/2008   RLQ PAIN 06/07/2007    CARPAL TUNNEL SYNDROME, BILATERAL 05/06/2007   Mitral valve disease 05/06/2007   ABNORMAL HEART RHYTHMS 05/06/2007   GERD 05/06/2007   HIATAL HERNIA 05/06/2007   IBS 05/06/2007   PLANTAR FASCIITIS 05/06/2007   Past Medical History:  Diagnosis Date   Anxiety    Arrhythmia ventricular    s/p ablation 2001   Carpal tunnel syndrome, bilateral    Chronic RLQ pain    Dyslipidemia    Dysrhythmia    V- tach- resolved with ablation 2002   GERD (gastroesophageal reflux disease)    Headache    Hiatal hernia    History of migraine headaches    IBS (irritable bowel syndrome)    Insomnia    Menorrhagia    Mitral valve disorder    MVP (mitral valve prolapse)    Plantar fasciitis    PONV (postoperative nausea and vomiting)    Uterine fibroid    small   Ventricular tachycardia ([place])     Family History  Problem Relation Age of Onset   Hiatal hernia Mother    Irritable bowel syndrome Mother    Hypertension Mother    Thyroid disease Mother    Heart disease Mother 11       AMI, 90% LAD, stent placement, cardiac arrest   Lung cancer Father        (+ TOBACCO)   Hypertension Sister    Colitis Maternal Grandmother        Crohns VS [facility]   Seizures Sister    Headache Sister    Pancreatic cancer Paternal Uncle    Colon cancer Neg Hx     Past Surgical History:  Procedure Laterality Date   ABDOMINAL HYSTERECTOMY     BILATERAL SALPINGECTOMY Bilateral 12/07/2013   Procedure: BILATERAL SALPINGECTOMY;  Surgeon: Lovenia Kim, MD;  Location: [facility];  Service: Gynecology;  Laterality: Bilateral;   CARDIAC ELECTROPHYSIOLOGY MAPPING AND ABLATION  2002   cspine  05/1999   c5-6   echocardiogram  2000/2001   KNEE SURGERY  1998   PATELLA ARTHROPLASTY     ROBOTIC ASSISTED TOTAL HYSTERECTOMY N/A 12/07/2013   Procedure: ROBOTIC ASSISTED TOTAL HYSTERECTOMY;  Surgeon: Lovenia Kim, MD;  Location: [facility];  Service: Gynecology;  Laterality: N/A;   SPINE SURGERY     Social History    Occupational History   Occupation: Scientist, water quality: [employer]   Tobacco Use   Smoking status: Never   Smokeless tobacco: Never  Vaping Use   Vaping Use: Never used  Substance and Sexual Activity   Alcohol use: Yes    Alcohol/week: 5.0 standard drinks of alcohol    Types: 5 Glasses of wine per week    Comment: occ   Drug use: No   Sexual activity: Yes    Partners: Male    Birth control/protection: None    Comment: h/o infertility

## 2021-11-24 ENCOUNTER — Encounter: Payer: Self-pay | Admitting: Internal Medicine

## 2021-11-24 DIAGNOSIS — Z8249 Family history of ischemic heart disease and other diseases of the circulatory system: Secondary | ICD-10-CM

## 2021-12-13 ENCOUNTER — Other Ambulatory Visit: Payer: Self-pay | Admitting: Student

## 2022-01-09 ENCOUNTER — Ambulatory Visit (HOSPITAL_COMMUNITY)
Admission: RE | Admit: 2022-01-09 | Discharge: 2022-01-09 | Disposition: A | Discharge status: Home / Self Care | Payer: 59 | Source: Ambulatory Visit | Attending: Internal Medicine | Admitting: Internal Medicine

## 2022-01-09 DIAGNOSIS — Z8249 Family history of ischemic heart disease and other diseases of the circulatory system: Secondary | ICD-10-CM | POA: Insufficient documentation

## 2022-01-17 ENCOUNTER — Telehealth: Payer: Self-pay

## 2022-01-17 DIAGNOSIS — Z8249 Family history of ischemic heart disease and other diseases of the circulatory system: Secondary | ICD-10-CM

## 2022-01-17 DIAGNOSIS — R002 Palpitations: Secondary | ICD-10-CM

## 2022-01-17 NOTE — Telephone Encounter (Signed)
-----  Message from Evans Lance, MD sent at 01/10/2022  5:41 PM EST ----- Her coronary CT scan looks good. She had some abnormal findings on her lung exam which I am not able to evaluate. Rec's are to repeat the CT scan in 3 months.

## 2022-01-17 NOTE — Telephone Encounter (Signed)
Called patient to discuss CT scan results.  Voicemail message left for Pt to call back to discuss repeat CT scan in 3 months per Dr. Tanna Furry note.  Follow up required.

## 2022-01-27 NOTE — Addendum Note (Signed)
Addended by: Oleta Mouse C on: 01/27/2022 01:33 PM   Modules accepted: Orders

## 2022-01-27 NOTE — Telephone Encounter (Signed)
Pt called back to discuss Cardiac CT results.    Per Dr. Lovena Le, Pt CT coronary scan looks good, Pt had some abnormal findings on the lung portion of the exam, which Dr.Taylor did not evaluate.  Dr. Lovena Le ordered repeat CT scan in 3 months.   Pt wanted to know what was read on CT scan regarding lungs;  Pt made aware of "tree-in-bud" densities within the bilateral lower lobes;  Pt stated a she had the start of a chest cold during this period of time.  Pt requested a Pulmonology referral from Dr. Lovena Le to investigate her densities noted on the CT Scan.  Pt agreed with another CT cardiac scan with Dr. Lovena Le in 3 months.   Pt understood, and is waiting on pulmonology referral.

## 2022-01-28 ENCOUNTER — Telehealth: Payer: Self-pay

## 2022-01-28 DIAGNOSIS — R918 Other nonspecific abnormal finding of lung field: Secondary | ICD-10-CM

## 2022-01-28 NOTE — Telephone Encounter (Signed)
-----  Message from Varney Daily, RN sent at 01/27/2022  1:33 PM EST ----- Regarding: ASK Dr. Lovena Le pulmonology referral lung densities ASK Dr. Lovena Le

## 2022-01-29 NOTE — Telephone Encounter (Signed)
Pt called this morning, left message that I sent her a MyChart message and submitted a referral per Dr. Lovena Le.  Pt told if she has questions to call me back.

## 2022-02-28 ENCOUNTER — Encounter: Payer: Self-pay | Admitting: Emergency Medicine

## 2022-02-28 ENCOUNTER — Ambulatory Visit: Payer: 59 | Admitting: Emergency Medicine

## 2022-02-28 VITALS — BP 118/78 | HR 67 | Temp 98.6°F | Ht 65.0 in | Wt 167.0 lb

## 2022-02-28 DIAGNOSIS — R002 Palpitations: Secondary | ICD-10-CM

## 2022-02-28 DIAGNOSIS — R9389 Abnormal findings on diagnostic imaging of other specified body structures: Secondary | ICD-10-CM | POA: Diagnosis not present

## 2022-02-28 NOTE — Patient Instructions (Signed)
We reviewed your CT scan of the chest today. We will plan to repeat your CT chest in April 2024. Follow Dr. Lamonte Sakai to review your CT scan after it has been performed.

## 2022-02-28 NOTE — Assessment & Plan Note (Signed)
Subtle micronodular disease, bronchiolitis noted on CT chest.  Interestingly she was dealing with probable viral upper respiratory infection at the time.  Unclear whether these findings are clinically significant but could represent either noninfectious inflammatory process, mycobacterial disease, etc.  Inconsistent with malignancy.  She has some fatigue but no other symptoms.  No cough or any focality.  We will plan to repeat her CT scan of the chest in 3 months, correlate this with symptoms.  Plan to workup if progressive or if any clinical indication to do so.

## 2022-02-28 NOTE — Progress Notes (Signed)
Subjective:    Patient ID: [name], female    DOB: [birth year], 57 y.o.   MRN: [id number]  HPI 57 year old woman, never smoker, with a history of ventricular arrhythmia post ablation (2002), mitral valve prolapse, GERD with a hiatal hernia, IBS, migraines, ? Possible fibromyalgia.  She is referred today for an abnormal CT scan of the chest. She is on CPAP for OSA.  She had COVID in 2021, then again tested positive in late January 2024. She had URI sx after Christmas as well (the scan was done right after).  She underwent a cardiac calcium scoring CT 01/09/2022 that showed some centrilobular tree-in-bud micronodular disease as below She has fatigue, back pain. Occasional hot spells, no fever. No sweats or chills.   CT scan of the chest 01/09/2022 reviewed by me shows no effusions, no mediastinal or hilar adenopathy.  There are numerous low-density centrilobular tree-in-bud densities in the bilateral lower lobes suggestive of possible inflammatory process.  No comparisons available.   Review of Systems As per HPI  Past Medical History:  Diagnosis Date   Anxiety    Arrhythmia ventricular    s/p ablation 2001   Carpal tunnel syndrome, bilateral    Chronic RLQ pain    Dyslipidemia    Dysrhythmia    V- tach- resolved with ablation 2002   GERD (gastroesophageal reflux disease)    Headache    Hiatal hernia    History of migraine headaches    IBS (irritable bowel syndrome)    Insomnia    Menorrhagia    Mitral valve disorder    MVP (mitral valve prolapse)    Plantar fasciitis    PONV (postoperative nausea and vomiting)    Uterine fibroid    small   Ventricular tachycardia ([place])      Family History  Problem Relation Age of Onset   Hiatal hernia Mother    Irritable bowel syndrome Mother    Hypertension Mother    Thyroid disease Mother    Heart disease Mother 24       AMI, 90% LAD, stent placement, cardiac arrest   Lung cancer Father        (+ TOBACCO)    Hypertension Sister    Colitis Maternal Grandmother        Crohns VS [facility]   Seizures Sister    Headache Sister    Pancreatic cancer Paternal Uncle    Colon cancer Neg Hx      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[employer]   Tobacco Use   Smoking status: Never   Smokeless tobacco: Never  Vaping Use   Vaping Use: Never used  Substance and Sexual Activity   Alcohol use: Yes    Alcohol/week: 5.0 standard drinks of alcohol    Types: 5 Glasses of wine per week    Comment: occ   Drug use: No   Sexual activity: Yes    Partners: Male    Birth control/protection: None    Comment: h/o infertility  Other Topics Concern   Not on file  Social History Narrative   Patient is married [name]) lives with her husband (2nd marriage, [employer]). Two daughters live with them.  Oldest daughter  is grown and lives locally with her husband and children.   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   Patient is right-handed.   Patient drinks one cup of caffeine daily.   Social Determinants of Health   Financial Resource Strain: Not on file  Food Insecurity: Not on file  Transportation Needs: Not on file  Physical Activity: Not on file  Stress: Not on file  Social Connections: Not on file  Intimate Partner Violence: Not on file    Has lived everywhere on the [place] office No pool or hot tub Has dogs, cats, no birds.   Allergies  Allergen Reactions   Methocarbamol     Other reaction(s): Redness   Covera-Hs [Verapamil]     Blood blisters, Stevens-Johnson syndrome   Crestor [Rosuvastatin Calcium]     Flu like symptoms, on different statin   Wellbutrin  [Bupropion] Palpitations     Outpatient Medications Prior to Visit  Medication Sig Dispense Refill   ALPRAZolam (XANAX) 1 MG tablet Take 1 tablet (1 mg total) by mouth at bedtime as needed for anxiety. 30 tablet 0   atorvastatin (LIPITOR) 20 MG tablet TAKE 1 TABLET BY MOUTH EVERY DAY 90 tablet 3   atorvastatin (LIPITOR) 20 MG tablet Take by mouth daily.     Baclofen 5 MG TABS Take 1 tablet by mouth as needed. 90 tablet 1   DULoxetine (CYMBALTA) 30 MG capsule Take 30-60 mg by mouth daily.     metoprolol succinate (TOPROL-XL) 25 MG 24 hr tablet TAKE 1 TABLET (25 MG TOTAL) BY MOUTH DAILY. 90 tablet 2   metoprolol tartrate (LOPRESSOR) 25 MG tablet TAKE 1 TABLET BY MOUTH AS NEEDED (FOR PALPITATIONS). UP TO 4 TABLETS IN A 24 HOUR PERIOD. 180 tablet 2   Multiple Vitamin (MULTI-VITAMINS) TABS Take by mouth.     pantoprazole (PROTONIX) 40 MG tablet Take 40 mg by mouth daily.     pregabalin (LYRICA) 75 MG capsule Take 1-2 capsules by mouth 2 (two) times daily. 1 capsule in the AM and 2 capsules at night, an hr before bedtime.     traMADol (ULTRAM) 50 MG tablet Take 1 tablet by mouth daily as needed.     Facility-Administered Medications Prior to Visit  Medication Dose Route Frequency Provider Last Rate Last Admin   0.9 %  sodium chloride infusion  500 mL Intravenous Continuous Irene Shipper, MD            Objective:   Physical Exam  Vitals:   02/28/22 1005  BP: 118/78  Pulse: 67  Temp: 98.6 F (37 C)  TempSrc: Oral  SpO2: 97%  Weight: 167 lb (75.8 kg)  Height: '5\' 5"'$  (1.651 m)   Gen: Pleasant, well-nourished, in no distress,  normal affect  ENT: No lesions,  mouth clear,  oropharynx clear, no postnasal drip  Neck: No JVD, no stridor  Lungs: No use of accessory muscles, no crackles or wheezing on normal respiration, no wheeze on forced expiration  Cardiovascular: RRR, heart sounds normal, no murmur or gallops, no peripheral edema  Musculoskeletal: No deformities, no cyanosis or  clubbing  Neuro: alert, awake, non focal  Skin: Warm, no lesions or rash      Assessment & Plan:  Abnormal CT of the chest Subtle micronodular disease, bronchiolitis noted on CT chest.  Interestingly she was dealing with probable viral upper respiratory infection at the time.  Unclear whether these findings are clinically significant but could represent either noninfectious inflammatory process, mycobacterial disease, etc.  Inconsistent with malignancy.  She has some fatigue but no other symptoms.  No cough or any focality.  We will plan to repeat her CT scan of the chest in 3 months, correlate this with symptoms.  Plan to workup if progressive or if any clinical indication to do so.  Baltazar Apo, MD, PhD 02/28/2022, 10:31 AM [facility] [phone] or if no answer before 7:00PM call [phone] For any issues after 7:00PM please call eLink [phone]

## 2022-03-17 ENCOUNTER — Other Ambulatory Visit (HOSPITAL_COMMUNITY): Payer: 59

## 2022-03-24 ENCOUNTER — Ambulatory Visit
Admission: RE | Admit: 2022-03-24 | Discharge: 2022-03-24 | Disposition: A | Discharge status: Home / Self Care | Payer: 59 | Source: Ambulatory Visit | Attending: Emergency Medicine | Admitting: Emergency Medicine

## 2022-03-24 DIAGNOSIS — R9389 Abnormal findings on diagnostic imaging of other specified body structures: Secondary | ICD-10-CM

## 2022-04-10 ENCOUNTER — Other Ambulatory Visit: Payer: 59

## 2022-04-23 ENCOUNTER — Telehealth: Payer: Self-pay | Admitting: Emergency Medicine

## 2022-04-23 NOTE — Telephone Encounter (Signed)
PT ret call from someone here. Poss for test results. Pls call @ [phone]

## 2022-04-23 NOTE — Telephone Encounter (Signed)
ATC X1 LVM for patient to call the office back 

## 2022-04-24 NOTE — Telephone Encounter (Signed)
Pt was made aware of CT results in result notes. Closing encounter.

## 2022-05-02 ENCOUNTER — Other Ambulatory Visit: Payer: Self-pay

## 2022-05-02 ENCOUNTER — Encounter (HOSPITAL_BASED_OUTPATIENT_CLINIC_OR_DEPARTMENT_OTHER): Payer: Self-pay | Admitting: Urology

## 2022-05-02 ENCOUNTER — Emergency Department (HOSPITAL_BASED_OUTPATIENT_CLINIC_OR_DEPARTMENT_OTHER)
Admission: EM | Admit: 2022-05-02 | Discharge: 2022-05-03 | Disposition: A | Discharge status: Home / Self Care | Payer: 59 | Attending: Emergency Medicine | Admitting: Emergency Medicine

## 2022-05-02 ENCOUNTER — Emergency Department (HOSPITAL_BASED_OUTPATIENT_CLINIC_OR_DEPARTMENT_OTHER): Payer: 59

## 2022-05-02 DIAGNOSIS — R112 Nausea with vomiting, unspecified: Secondary | ICD-10-CM

## 2022-05-02 DIAGNOSIS — R197 Diarrhea, unspecified: Secondary | ICD-10-CM | POA: Diagnosis not present

## 2022-05-02 DIAGNOSIS — R1084 Generalized abdominal pain: Secondary | ICD-10-CM | POA: Insufficient documentation

## 2022-05-02 DIAGNOSIS — E860 Dehydration: Secondary | ICD-10-CM

## 2022-05-02 LAB — COMPREHENSIVE METABOLIC PANEL
ALT: 23 U/L (ref 0–44)
AST: 26 U/L (ref 15–41)
Albumin: 5.1 g/dL — ABNORMAL HIGH (ref 3.5–5.0)
Alkaline Phosphatase: 49 U/L (ref 38–126)
Anion gap: 14 (ref 5–15)
BUN: 23 mg/dL — ABNORMAL HIGH (ref 6–20)
CO2: 22 mmol/L (ref 22–32)
Calcium: 9.9 mg/dL (ref 8.9–10.3)
Chloride: 102 mmol/L (ref 98–111)
Creatinine, Ser: 0.74 mg/dL (ref 0.44–1.00)
GFR, Estimated: >60 mL/min (ref >60)
Glucose, Bld: 110 mg/dL — ABNORMAL HIGH (ref 70–99)
Potassium: 3.3 mmol/L — ABNORMAL LOW (ref 3.5–5.1)
Sodium: 138 mmol/L (ref 135–145)
Total Bilirubin: 1.4 mg/dL — ABNORMAL HIGH (ref 0.3–1.2)
Total Protein: 8.6 g/dL — ABNORMAL HIGH (ref 6.5–8.1)

## 2022-05-02 LAB — LIPASE, BLOOD: Lipase: 33 U/L (ref 11–51)

## 2022-05-02 LAB — URINALYSIS, ROUTINE W REFLEX MICROSCOPIC
Glucose, UA: NEGATIVE mg/dL
Hgb urine dipstick: NEGATIVE
Ketones, ur: >=80 mg/dL — AB
Leukocytes,Ua: NEGATIVE
Nitrite: NEGATIVE
Protein, ur: 100 mg/dL — AB
Specific Gravity, Urine: >=1.03 (ref 1.005–1.030)
pH: 5.5 (ref 5.0–8.0)

## 2022-05-02 LAB — CBC
HCT: 42.8 % (ref 36.0–46.0)
Hemoglobin: 15.2 g/dL — ABNORMAL HIGH (ref 12.0–15.0)
MCH: 31.3 pg (ref 26.0–34.0)
MCHC: 35.5 g/dL (ref 30.0–36.0)
MCV: 88.1 fL (ref 80.0–100.0)
Platelets: 272 10*3/uL (ref 150–400)
RBC: 4.86 MIL/uL (ref 3.87–5.11)
RDW: 12 % (ref 11.5–15.5)
WBC: 8.9 10*3/uL (ref 4.0–10.5)
nRBC: 0 % (ref 0.0–0.2)

## 2022-05-02 LAB — URINALYSIS, MICROSCOPIC (REFLEX): RBC / HPF: NONE SEEN RBC/hpf (ref 0–5)

## 2022-05-02 LAB — CBG MONITORING, ED: Glucose-Capillary: 94 mg/dL (ref 70–99)

## 2022-05-02 MED ORDER — ONDANSETRON HCL 4 MG/2ML IJ SOLN
4.0000 mg | Freq: Once | INTRAMUSCULAR | Status: AC
  Administered 2022-05-02: 4 mg via INTRAVENOUS
  Filled 2022-05-02: qty 2

## 2022-05-02 MED ORDER — LORAZEPAM 2 MG/ML IJ SOLN
1.0000 mg | Freq: Once | INTRAMUSCULAR | Status: AC
  Administered 2022-05-02: 1 mg via INTRAVENOUS
  Filled 2022-05-02: qty 1

## 2022-05-02 MED ORDER — SODIUM CHLORIDE 0.9 % IV SOLN
25.0000 mg | Freq: Once | INTRAVENOUS | Status: AC
  Administered 2022-05-02: 25 mg via INTRAVENOUS
  Filled 2022-05-02: qty 1

## 2022-05-02 MED ORDER — PROMETHAZINE HCL 25 MG/ML IJ SOLN
INTRAMUSCULAR | Status: AC
  Filled 2022-05-02: qty 1

## 2022-05-02 MED ORDER — LACTATED RINGERS IV BOLUS
1000.0000 mL | Freq: Once | INTRAVENOUS | Status: AC
  Administered 2022-05-02: 1000 mL via INTRAVENOUS

## 2022-05-02 MED ORDER — IOHEXOL 300 MG/ML  SOLN
100.0000 mL | Freq: Once | INTRAMUSCULAR | Status: AC | PRN
  Administered 2022-05-02: 100 mL via INTRAVENOUS

## 2022-05-02 NOTE — ED Triage Notes (Signed)
Pt states vomiting x 24 hours  Unable to tolerate po fluid intake  Started taking zetbound injection yesterday prior to vomiting  Recent dx of fibromyalgia,

## 2022-05-03 MED ORDER — PROMETHAZINE HCL 25 MG RE SUPP: 25.0000 mg | Freq: Four times a day (QID) | RECTAL | 0 refills | Status: DC | PRN

## 2022-05-03 MED ORDER — ONDANSETRON 4 MG PO TBDP: 4.0000 mg | ORAL_TABLET | Freq: Three times a day (TID) | ORAL | 0 refills | Status: DC | PRN

## 2022-05-03 NOTE — ED Provider Notes (Signed)
[place] [facility] Provider Note   CSN: [id number] Arrival date & time: 05/02/22  1851     History  Chief Complaint  Patient presents with   Emesis    [name] is a 57 y.o. female.  Patient with history of fibromyalgia and hyperlipidemia presents today with complaints of nausea, vomiting, diarrhea, and abdominal pain.  She states that she had her first shot of Zepbound yesterday for her fibromyalgia and her symptoms began almost immediately afterwards.  She states that she has been persistently vomiting for the past 24 hours.  She also has had a few bouts of diarrhea as well.  Denies any hematemesis, hematochezia, or melena.  No known sick contacts.  No history of similar symptoms previously.  Does have a history of hysterectomy, no further history of abdominal surgeries.  Her pain is generalized and throughout her abdomen.  Denies any urinary symptoms.  The history is provided by the patient. No language interpreter was used.  Emesis Associated symptoms: abdominal pain and diarrhea        Home Medications Prior to Admission medications   Medication Sig Start Date End Date Taking? Authorizing Provider  ALPRAZolam Prudy Feeler) 1 MG tablet Take 1 tablet (1 mg total) by mouth at bedtime as needed for anxiety. 08/02/17   Porfirio Oar, PA  atorvastatin (LIPITOR) 20 MG tablet TAKE 1 TABLET BY MOUTH EVERY DAY 12/14/16   Porfirio Oar, PA  atorvastatin (LIPITOR) 20 MG tablet Take by mouth daily. 07/04/20   [provider]  Baclofen 5 MG TABS Take 1 tablet by mouth as needed. 05/13/21   Dohmeier, Porfirio Mylar, MD  DULoxetine (CYMBALTA) 30 MG capsule Take 30-60 mg by mouth daily. 08/05/20   [provider]  metoprolol succinate (TOPROL-XL) 25 MG 24 hr tablet TAKE 1 TABLET (25 MG TOTAL) BY MOUTH DAILY. 08/21/21   Marinus Maw, MD  metoprolol tartrate (LOPRESSOR) 25 MG tablet TAKE 1 TABLET BY MOUTH AS NEEDED (FOR PALPITATIONS). UP TO  4 TABLETS IN A 24 HOUR PERIOD. 12/16/21   Marinus Maw, MD  Multiple Vitamin (MULTI-VITAMINS) TABS Take by mouth.    [provider]  pantoprazole (PROTONIX) 40 MG tablet Take 40 mg by mouth daily.    [provider]  pregabalin (LYRICA) 75 MG capsule Take 1-2 capsules by mouth 2 (two) times daily. 1 capsule in the AM and 2 capsules at night, an hr before bedtime. 02/20/21   [provider]  traMADol (ULTRAM) 50 MG tablet Take 1 tablet by mouth daily as needed. 06/14/19   [provider]      Allergies    Methocarbamol, Covera-hs [verapamil], Crestor [rosuvastatin calcium], and Wellbutrin [bupropion]    Review of Systems   Review of Systems  Gastrointestinal:  Positive for abdominal pain, diarrhea, nausea and vomiting.  All other systems reviewed and are negative.   Physical Exam Updated Vital Signs BP 127/72   Pulse 80   Temp 98.2 F (36.8 C) (Oral)   Resp 17   Ht 5\' 5"  (1.651 m)   Wt 75.8 kg   LMP 12/03/2014   SpO2 100%   BMI 27.81 kg/m  Physical Exam Vitals and nursing note reviewed.  Constitutional:      General: She is not in acute distress.    Appearance: Normal appearance. She is normal weight. She is not ill-appearing, toxic-appearing or diaphoretic.  HENT:     Head: Normocephalic and atraumatic.  Cardiovascular:  Rate and Rhythm: Normal rate.  Pulmonary:     Effort: Pulmonary effort is normal. No respiratory distress.  Abdominal:     General: Abdomen is flat.     Palpations: Abdomen is soft.     Comments: Mild generalized abdominal tenderness to palpation  Musculoskeletal:        General: Normal range of motion.     Cervical back: Normal range of motion.  Skin:    General: Skin is warm and dry.  Neurological:     General: No focal deficit present.     Mental Status: She is alert.  Psychiatric:        Mood and Affect: Mood normal.        Behavior: Behavior normal.     ED Results / Procedures / Treatments    Labs (all labs ordered are listed, but only abnormal results are displayed) Labs Reviewed  COMPREHENSIVE METABOLIC PANEL - Abnormal; Notable for the following components:      Result Value   Potassium 3.3 (*)    Glucose, Bld 110 (*)    BUN 23 (*)    Total Protein 8.6 (*)    Albumin 5.1 (*)    Total Bilirubin 1.4 (*)    All other components within normal limits  CBC - Abnormal; Notable for the following components:   Hemoglobin 15.2 (*)    All other components within normal limits  URINALYSIS, ROUTINE W REFLEX MICROSCOPIC - Abnormal; Notable for the following components:   APPearance CLOUDY (*)    Bilirubin Urine SMALL (*)    Ketones, ur >=80 (*)    Protein, ur 100 (*)    All other components within normal limits  URINALYSIS, MICROSCOPIC (REFLEX) - Abnormal; Notable for the following components:   Bacteria, UA FEW (*)    All other components within normal limits  LIPASE, BLOOD  CBG MONITORING, ED    EKG None  Radiology CT ABDOMEN PELVIS W CONTRAST  Result Date: 05/02/2022 CLINICAL DATA:  Acute abdominal pain, vomiting. EXAM: CT ABDOMEN AND PELVIS WITH CONTRAST TECHNIQUE: Multidetector CT imaging of the abdomen and pelvis was performed using the standard protocol following bolus administration of intravenous contrast. RADIATION DOSE REDUCTION: This exam was performed according to the departmental dose-optimization program which includes automated exposure control, adjustment of the mA and/or kV according to patient size and/or use of iterative reconstruction technique. CONTRAST:  OMNIPAQUE IOHEXOL 300 MG/ML  SOLN COMPARISON:  None Available. FINDINGS: Lower chest: No acute abnormality. Hepatobiliary: No focal liver abnormality is seen. No gallstones, gallbladder wall thickening, or biliary dilatation. Pancreas: Unremarkable. No pancreatic ductal dilatation or surrounding inflammatory changes. Spleen: Normal in size without focal abnormality. Calcified granulomas are present.  Adrenals/Urinary Tract: Adrenal glands are unremarkable. Kidneys are normal, without renal calculi, focal lesion, or hydronephrosis. Bladder is unremarkable. Stomach/Bowel: Stomach is within normal limits. Appendix appears normal. No evidence of bowel wall thickening, distention, or inflammatory changes. Vascular/Lymphatic: No significant vascular findings are present. No enlarged abdominal or pelvic lymph nodes. Reproductive: Uterus and bilateral adnexa are unremarkable. Other: No abdominal wall hernia or abnormality. No abdominopelvic ascites. Musculoskeletal: No acute or significant osseous findings. IMPRESSION: No acute process in the abdomen or pelvis. Electronically Signed   By: Darliss Cheney M.D.   On: 05/02/2022 21:35    Procedures Procedures    Medications Ordered in ED Medications  promethazine (PHENERGAN) 25 MG/ML injection (  Not Given 05/02/22 2354)  ondansetron (ZOFRAN) injection 4 mg (4 mg Intravenous Given 05/02/22 2005)  lactated ringers bolus 1,000 mL (0 mLs Intravenous Stopped 05/02/22 2223)  iohexol (OMNIPAQUE) 300 MG/ML solution 100 mL (100 mLs Intravenous Contrast Given 05/02/22 2117)  ondansetron (ZOFRAN) injection 4 mg (4 mg Intravenous Given 05/02/22 2142)  promethazine (PHENERGAN) 25 mg in sodium chloride 0.9 % 50 mL IVPB (0 mg Intravenous Stopped 05/03/22 0005)  LORazepam (ATIVAN) injection 1 mg (1 mg Intravenous Given 05/02/22 2333)    ED Course/ Medical Decision Making/ A&P                             Medical Decision Making Amount and/or Complexity of Data Reviewed Labs: ordered. Radiology: ordered.  Risk Prescription drug management.   This patient is a 57 y.o. female who presents to the ED for concern of abdominal pain, nausea, vomiting, and diarrhea, this involves an extensive number of treatment options, and is a complaint that carries with it a high risk of complications and morbidity. The emergent differential diagnosis prior to evaluation includes, but is  not limited to,  gastroenteritis, appendicitis, Bowel obstruction, Bowel perforation. Gastroparesis, DKA, Hernia, Inflammatory bowel disease, mesenteric ischemia, pancreatitis, peritonitis SBP, volvulus.  This is not an exhaustive differential.   Past Medical History / Co-morbidities / Social History: history of fibromyalgia and hyperlipidemia  Physical Exam: Physical exam performed. The pertinent findings include: Actively vomiting on exam, generalized abdominal tenderness to palpation  Lab Tests: I ordered, and personally interpreted labs.  The pertinent results include:  K 3.3, t bili 1.4.  UA with ketones and protein likely due to dehydration   Imaging Studies: I ordered imaging studies including CT abdomen and pelvis. I independently visualized and interpreted imaging which showed no acute findings. I agree with the radiologist interpretation.   Medications: I ordered medication including fluids, Ativan, Zofran, and Phenergan for dehydration, nausea, vomiting. Reevaluation of the patient after these medicines showed that the patient resolved. I have reviewed the patients home medicines and have made adjustments as needed.    Disposition:  Patient is nontoxic, nonseptic appearing, in no apparent distress.  Patient's pain and other symptoms adequately managed in emergency department.  Fluid bolus given.  Labs, imaging and vitals reviewed.  Patient does not meet the SIRS or Sepsis criteria.  On repeat exam patient does not have a surgical abdomin and there are no peritoneal signs.  No indication of appendicitis, bowel obstruction, bowel perforation, cholecystitis, diverticulitis, PID or ectopic pregnancy.  After fluids and antiemetics, patient is able to eat and drink without any residual nausea or vomiting and is feeling much better.  She states that she feels ready to go home.  Suspect patient's symptoms were related to a reaction to the Zepbound that she started yesterday.  Discussed with  patient who is understanding and in agreement.  Patient discharged home with Zofran and Phenergan for symptomatic treatment and given strict instructions for follow-up with their primary care physician.  I have also discussed reasons to return immediately to the ER.  Patient expresses understanding and agrees with plan.  Patient discharged in stable condition.   Final Clinical Impression(s) / ED Diagnoses Final diagnoses:  Dehydration  Nausea vomiting and diarrhea    Rx / DC Orders ED Discharge Orders          Ordered    ondansetron (ZOFRAN-ODT) 4 MG disintegrating tablet  Every 8 hours PRN        05/03/22 0037    promethazine (PHENERGAN) 25 MG suppository  Every  6 hours PRN        05/03/22 0037          An After Visit Summary was printed and given to the patient.     Vear Clock 05/03/22 0050    Terald Sleeper, MD 05/03/22 [phone]

## 2022-05-03 NOTE — Discharge Instructions (Signed)
As we discussed, your workup in the ER today was reassuring for acute findings.  Laboratory evaluation and CT imaging did not reveal any emergent concerns.  I suspect that your nausea and vomiting are due to a side effect of the injection that you had yesterday.  I recommend that you do not get any additional injections of this medication.  I have given you a prescription for 2 nausea medication for you to take as prescribed as needed for management of your symptoms.  I recommend that you follow-up closely with your primary care doctor for continued evaluation and management of your symptoms.  Return if development of any new or worsening symptoms.

## 2022-05-13 NOTE — Progress Notes (Unsigned)
PATIENT: [name] DOB: [birth year]  REASON FOR VISIT: follow up HISTORY FROM: patient PRIMARY NEUROLOGIST: Dr. Vickey Huger  HISTORY OF PRESENT ILLNESS: Today 05/14/22:  [name] is a 57 y.o. female with a history of OSA on CPAP. Returns today for follow-up.  She reports that the CPAP is working well.  She states that she puts it on as soon as she gets in bed whether she is going to sleep at that time or not.  She is still struggling with insomnia.  She states that there are nights that she not fall asleep until 3 or 4 AM.  Reports that her primary care has given her Xanax 1.5 mg to use at bedtime.  She also has Lunesta but does not use it consistently.  She states that Alfonso Patten does not always work for her.  She has recently started taking delta 9 Gummies which is CBD.  She does state that that has helped some.  She has a diagnosis of fibromyalgia and sees a specialist in [place].  She is not sure if this contributes to her fragmented sleep.  She also states that she has been waking up with a dull headache.  She states that when she was seeing a sleep specialist at [facility] she was on supplemental oxygen.  However CPAP titration here in 2020 did not show prolonged hypoxemia        HISTORY   REVIEW OF SYSTEMS: Out of a complete 14 system review of symptoms, the patient complains only of the following symptoms, and all other reviewed systems are negative.  FSS ESS  ALLERGIES: Allergies  Allergen Reactions   Methocarbamol     Other reaction(s): Redness   Covera-Hs [Verapamil]     Blood blisters, Stevens-Johnson syndrome   Crestor [Rosuvastatin Calcium]     Flu like symptoms, on different statin   Wellbutrin [Bupropion] Palpitations    HOME MEDICATIONS: Outpatient Medications Prior to Visit  Medication Sig Dispense Refill   ALPRAZolam (XANAX) 1 MG tablet Take 1 tablet (1 mg total) by mouth at bedtime as needed for anxiety. 30 tablet 0   atorvastatin  (LIPITOR) 20 MG tablet TAKE 1 TABLET BY MOUTH EVERY DAY 90 tablet 3   atorvastatin (LIPITOR) 20 MG tablet Take by mouth daily.     Baclofen 5 MG TABS Take 1 tablet by mouth as needed. 90 tablet 1   DULoxetine (CYMBALTA) 30 MG capsule Take 30-60 mg by mouth daily.     metoprolol succinate (TOPROL-XL) 25 MG 24 hr tablet TAKE 1 TABLET (25 MG TOTAL) BY MOUTH DAILY. 90 tablet 2   metoprolol tartrate (LOPRESSOR) 25 MG tablet TAKE 1 TABLET BY MOUTH AS NEEDED (FOR PALPITATIONS). UP TO 4 TABLETS IN A 24 HOUR PERIOD. 180 tablet 2   Multiple Vitamin (MULTI-VITAMINS) TABS Take by mouth.     ondansetron (ZOFRAN-ODT) 4 MG disintegrating tablet Take 1 tablet (4 mg total) by mouth every 8 (eight) hours as needed for nausea or vomiting. 20 tablet 0   pantoprazole (PROTONIX) 40 MG tablet Take 40 mg by mouth daily.     pregabalin (LYRICA) 75 MG capsule Take 1-2 capsules by mouth 2 (two) times daily. 1 capsule in the AM and 2 capsules at night, an hr before bedtime.     promethazine (PHENERGAN) 25 MG suppository Place 1 suppository (25 mg total) rectally every 6 (six) hours as needed for nausea or vomiting. 12 each 0   traMADol (ULTRAM) 50 MG tablet Take 1 tablet  by mouth daily as needed.     Facility-Administered Medications Prior to Visit  Medication Dose Route Frequency Provider Last Rate Last Admin   0.9 %  sodium chloride infusion  500 mL Intravenous Continuous Hilarie Fredrickson, MD        PAST MEDICAL HISTORY: Past Medical History:  Diagnosis Date   Anxiety    Arrhythmia ventricular    s/p ablation 2001   Carpal tunnel syndrome, bilateral    Chronic RLQ pain    Dyslipidemia    Dysrhythmia    V- tach- resolved with ablation 2002   GERD (gastroesophageal reflux disease)    Headache    Hiatal hernia    History of migraine headaches    IBS (irritable bowel syndrome)    Insomnia    Menorrhagia    Mitral valve disorder    MVP (mitral valve prolapse)    Plantar fasciitis    PONV (postoperative nausea  and vomiting)    Uterine fibroid    small   Ventricular tachycardia (HCC)     PAST SURGICAL HISTORY: Past Surgical History:  Procedure Laterality Date   ABDOMINAL HYSTERECTOMY     BILATERAL SALPINGECTOMY Bilateral 12/07/2013   Procedure: BILATERAL SALPINGECTOMY;  Surgeon: Lenoard Aden, MD;  Location: [facility];  Service: Gynecology;  Laterality: Bilateral;   CARDIAC ELECTROPHYSIOLOGY MAPPING AND ABLATION  2002   cspine  05/1999   c5-6   echocardiogram  2000/2001   KNEE SURGERY  1998   PATELLA ARTHROPLASTY     ROBOTIC ASSISTED TOTAL HYSTERECTOMY N/A 12/07/2013   Procedure: ROBOTIC ASSISTED TOTAL HYSTERECTOMY;  Surgeon: Lenoard Aden, MD;  Location: [facility];  Service: Gynecology;  Laterality: N/A;   SPINE SURGERY      FAMILY HISTORY: Family History  Problem Relation Age of Onset   Hiatal hernia Mother    Irritable bowel syndrome Mother    Hypertension Mother    Thyroid disease Mother    Heart disease Mother 71       AMI, 90% LAD, stent placement, cardiac arrest   Lung cancer Father        (+ TOBACCO)   Hypertension Sister    Colitis Maternal Grandmother        Crohns VS U.C   Seizures Sister    Headache Sister    Pancreatic cancer Paternal Uncle    Colon cancer Neg Hx     SOCIAL HISTORY: Social History   Socioeconomic History   Marital status: Married    Spouse name: [name]   Number of children: 2   Years of education: 16   Highest education level: Bachelor's degree (e.g., BA, AB, BS)  Occupational History   Occupation: Patent attorney: [employer]   Tobacco Use   Smoking status: Never   Smokeless tobacco: Never  Vaping Use   Vaping Use: Never used  Substance and Sexual Activity   Alcohol use: Yes    Alcohol/week: 5.0 standard drinks of alcohol    Types: 5 Glasses of wine per week    Comment: occ   Drug use: No   Sexual activity: Yes    Partners: Male    Birth control/protection: None    Comment: h/o infertility  Other  Topics Concern   Not on file  Social History Narrative   Patient is married [name]) lives with her husband (2nd marriage, [employer] Chief Strategy [employer]). Two daughters live with them.  Oldest daughter is grown and lives locally with her husband  and children.   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     Patient has a Multimedia programmer.   Patient is right-handed.   Patient drinks one cup of caffeine daily.   Social Determinants of Health   Financial Resource Strain: Not on file  Food Insecurity: Not on file  Transportation Needs: Not on file  Physical Activity: Not on file  Stress: Not on file  Social Connections: Not on file  Intimate Partner Violence: Not on file      PHYSICAL EXAM  Vitals:   05/14/22 0946  Weight: 166 lb 9.6 oz (75.6 kg)  Height: 5' 5.5" (1.664 m)   Body mass index is 27.3 kg/m.  Generalized: Well developed, in no acute distress    Neurological examination  Mentation: Alert oriented to time, place, history taking. Follows all commands speech and language fluent Cranial nerve II-XII: Extraocular movements were full, visual field were full on confrontational test Head turning and shoulder shrug  were normal and symmetric. Motor: The motor testing reveals 5 over 5 strength of all 4 extremities. Good symmetric motor tone is noted throughout.  Sensory: Sensory testing is intact to soft touch on all 4 extremities. No evidence of extinction is noted.  Gait and station: Gait is normal.    DIAGNOSTIC DATA (LABS, IMAGING, TESTING) - I reviewed patient records, labs, notes, testing and imaging myself where available.  Lab Results  Component Value Date   WBC 8.9 05/02/2022   HGB 15.2 (H) 05/02/2022   HCT 42.8 05/02/2022   MCV 88.1 05/02/2022   PLT 272 05/02/2022      Component Value Date/Time   NA 138 05/02/2022 1900   NA 139 07/08/2016 0915   K 3.3 (L) 05/02/2022 1900   CL 102 05/02/2022 1900   CO2 22 05/02/2022 1900   GLUCOSE  110 (H) 05/02/2022 1900   BUN 23 (H) 05/02/2022 1900   BUN 19 07/08/2016 0915   CREATININE 0.74 05/02/2022 1900   CREATININE 0.71 07/03/2015 0905   CALCIUM 9.9 05/02/2022 1900   PROT 8.6 (H) 05/02/2022 1900   PROT 6.4 07/08/2016 0915   ALBUMIN 5.1 (H) 05/02/2022 1900   ALBUMIN 4.3 07/08/2016 0915   AST 26 05/02/2022 1900   ALT 23 05/02/2022 1900   ALKPHOS 49 05/02/2022 1900   BILITOT 1.4 (H) 05/02/2022 1900   BILITOT 0.4 07/08/2016 0915   GFRNONAA >60 05/02/2022 1900   GFRAA >60 07/20/2018 1934   Lab Results  Component Value Date   CHOL 222 (H) 07/08/2016   HDL 58 07/08/2016   LDLCALC 127 (H) 07/08/2016   TRIG 186 (H) 07/08/2016   CHOLHDL 3.8 07/08/2016   Lab Results  Component Value Date   HGBA1C 5.2 07/08/2016   No results found for: "VITAMINB12" Lab Results  Component Value Date   TSH 2.508 01/02/2015      ASSESSMENT AND PLAN 57 y.o. year old female  has a past medical history of Anxiety, Arrhythmia ventricular, Carpal tunnel syndrome, bilateral, Chronic RLQ pain, Dyslipidemia, Dysrhythmia, GERD (gastroesophageal reflux disease), Headache, Hiatal hernia, History of migraine headaches, IBS (irritable bowel syndrome), Insomnia, Menorrhagia, Mitral valve disorder, MVP (mitral valve prolapse), Plantar fasciitis, PONV (postoperative nausea and vomiting), Uterine fibroid, and Ventricular tachycardia (HCC). here with:  OSA on CPAP Insomnia Morning headache  - CPAP compliance excellent - Good treatment of AHI  - Encourage patient to use CPAP nightly and > 4 hours each night -Order placed for overnight pulse oximetry  while on CPAP to rule out hypoxemia -Did advise patient that her morning headaches could be coming from her fragmented sleep. -Order placed for cognitive behavioral therapy for insomnia - F/U in 6-7 months or sooner if needed     Butch Penny, MSN, NP-C 05/14/2022, 9:50 AM [facility] [address] [place], [place]  [address] [phone]

## 2022-05-14 ENCOUNTER — Encounter: Payer: Self-pay | Admitting: Adult Health

## 2022-05-14 ENCOUNTER — Ambulatory Visit: Payer: 59 | Admitting: Adult Health

## 2022-05-14 ENCOUNTER — Telehealth: Payer: Self-pay | Admitting: Adult Health

## 2022-05-14 VITALS — BP 116/69 | HR 77 | Ht 65.5 in | Wt 166.6 lb

## 2022-05-14 DIAGNOSIS — R519 Headache, unspecified: Secondary | ICD-10-CM | POA: Diagnosis not present

## 2022-05-14 DIAGNOSIS — G4733 Obstructive sleep apnea (adult) (pediatric): Secondary | ICD-10-CM

## 2022-05-14 DIAGNOSIS — G4700 Insomnia, unspecified: Secondary | ICD-10-CM

## 2022-05-14 NOTE — Telephone Encounter (Signed)
Referral sent to [facility]: Phone: [phone]  Fax: [phone]

## 2022-05-15 NOTE — Progress Notes (Signed)
Fax confirmation received for ONO on cpap [facility] [phone].

## 2022-05-19 ENCOUNTER — Other Ambulatory Visit: Payer: Self-pay | Admitting: Internal Medicine

## 2022-05-27 ENCOUNTER — Telehealth: Payer: Self-pay | Admitting: *Deleted

## 2022-05-27 NOTE — Telephone Encounter (Signed)
ONO results received from [facility]. Results placed on MM NP's desk.

## 2022-05-28 NOTE — Telephone Encounter (Signed)
I called pt and let her know that the ONO results per Dr. Valetta Mole was no oxygen was needed.  Pt was glad of the results.  Appreciated call .

## 2022-06-03 ENCOUNTER — Encounter: Payer: Self-pay | Admitting: Emergency Medicine

## 2022-06-03 ENCOUNTER — Ambulatory Visit: Payer: 59 | Admitting: Emergency Medicine

## 2022-06-03 VITALS — BP 134/74 | HR 64 | Temp 98.0°F | Ht 65.0 in | Wt 170.4 lb

## 2022-06-03 DIAGNOSIS — R053 Chronic cough: Secondary | ICD-10-CM | POA: Diagnosis not present

## 2022-06-03 DIAGNOSIS — R9389 Abnormal findings on diagnostic imaging of other specified body structures: Secondary | ICD-10-CM

## 2022-06-03 MED ORDER — BENZONATATE 100 MG PO CAPS: 100.0000 mg | ORAL_CAPSULE | Freq: Four times a day (QID) | ORAL | 1 refills | Status: DC | PRN

## 2022-06-03 NOTE — Addendum Note (Signed)
Addended by: Marylynn Pearson on: 06/03/2022 02:34 PM   Modules accepted: Orders

## 2022-06-03 NOTE — Patient Instructions (Signed)
We reviewed your CT scan of the chest today. Try increasing your pantoprazole 40 mg twice a day for 10 days, then go back to once a day.  Take this medication 1 hour around food. Continue your Zyrtec once daily Try adding fluticasone nasal spray (generic Flonase), 2 sprays each nostril once daily for 10 days. Use Tessalon 100 mg up to every 6 hours if needed for cough suppression. If your cough persists please arrange for follow-up so we can consider other appropriate testing including pulmonary function test or possibly even bronchoscopy.

## 2022-06-03 NOTE — Progress Notes (Signed)
Subjective:    Patient ID: [name], female    DOB: Feb 19, 1965, 57 y.o.   MRN: [id number]  HPI 57 year old woman, never smoker, with a history of ventricular arrhythmia post ablation (2002), mitral valve prolapse, GERD with a hiatal hernia, IBS, migraines, ? Possible fibromyalgia.  She is referred today for an abnormal CT scan of the chest. She is on CPAP for OSA.  She had COVID in 2021, then again tested positive in late January 2024. She had URI sx after Christmas as well (the scan was done right after).  She underwent a cardiac calcium scoring CT 01/09/2022 that showed some centrilobular tree-in-bud micronodular disease as below She has fatigue, back pain. Occasional hot spells, no fever. No sweats or chills.   CT scan of the chest 01/09/2022 reviewed by me shows no effusions, no mediastinal or hilar adenopathy.  There are numerous low-density centrilobular tree-in-bud densities in the bilateral lower lobes suggestive of possible inflammatory process.  No comparisons available.  ROV 06/03/22 --follow-up visit 57 year old woman, never smoker with history of OSA on CPAP, ablation for ventricular dysrhythmia, mitral valve prolapse, GERD with a hiatal hernia, IBS, question fibromyalgia.  She had COVID-19 in 2021 and then in 01/2022.  She underwent a cardiac calcium scoring CT chest 01/09/2022 that showed some centrilobular tree-in-bud micronodular disease.  Of note this was done right after she had an upper respiratory infection in late December.  We arranged to perform a follow-up CT to assess for interval change, done 03/24/2022. She is continuing to cough some  She has GERD but it feels under control on protonix. She just recently started zyrtec.   CT scan of the chest 03/24/2022 reviewed by me shows interval clearing of the areas of bronchiolitis in the lower lobes that were seen on 01/09/2022.  No infiltrates, adenopathy.   Review of Systems As per HPI  Past Medical History:  Diagnosis  Date   Anxiety    Arrhythmia ventricular    s/p ablation 2001   Carpal tunnel syndrome, bilateral    Chronic RLQ pain    Dyslipidemia    Dysrhythmia    V- tach- resolved with ablation 2002   GERD (gastroesophageal reflux disease)    Headache    Hiatal hernia    History of migraine headaches    IBS (irritable bowel syndrome)    Insomnia    Menorrhagia    Mitral valve disorder    MVP (mitral valve prolapse)    Plantar fasciitis    PONV (postoperative nausea and vomiting)    Uterine fibroid    small   Ventricular tachycardia (HCC)      Family History  Problem Relation Age of Onset   Hiatal hernia Mother    Irritable bowel syndrome Mother    Hypertension Mother    Thyroid disease Mother    Heart disease Mother 84       AMI, 90% LAD, stent placement, cardiac arrest   Lung cancer Father        (+ TOBACCO)   Hypertension Sister    Colitis Maternal Grandmother        Crohns VS [facility]   Seizures Sister    Headache Sister    Pancreatic cancer Paternal Uncle    Colon cancer Neg Hx      Social History   Socioeconomic History   Marital status: Married    Spouse name: [name]   Number of children: 2   Years of education: 16   Highest education  level: Bachelor's degree (e.g., BA, AB, BS)  Occupational History   Occupation: Patent attorney: principal [employer]   Tobacco Use   Smoking status: Never   Smokeless tobacco: Never  Vaping Use   Vaping Use: Never used  Substance and Sexual Activity   Alcohol use: Yes    Alcohol/week: 5.0 standard drinks of alcohol    Types: 5 Glasses of wine per week    Comment: occ   Drug use: No   Sexual activity: Yes    Partners: Male    Birth control/protection: None    Comment: h/o infertility  Other Topics Concern   Not on file  Social History Narrative   Patient is married [name]) lives with her husband (2nd marriage, [employer] Chief [employer]). Two daughters live with them.  Oldest daughter is grown and lives locally  with her husband and children.   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     Patient has a Multimedia programmer.   Patient is right-handed.   Patient drinks one cup of caffeine daily.   Social Determinants of Health   Financial Resource Strain: Not on file  Food Insecurity: Not on file  Transportation Needs: Not on file  Physical Activity: Not on file  Stress: Not on file  Social Connections: Not on file  Intimate Partner Violence: Not on file    Has lived everywhere on the [employer] Worked office No pool or hot tub Has dogs, cats, no birds.   Allergies  Allergen Reactions   Methocarbamol     Other reaction(s): Redness   Covera-Hs [Verapamil]     Blood blisters, Stevens-Johnson syndrome   Crestor [Rosuvastatin Calcium]     Flu like symptoms, on different statin   Wellbutrin [Bupropion] Palpitations     Outpatient Medications Prior to Visit  Medication Sig Dispense Refill   ALPRAZolam (XANAX) 1 MG tablet Take 1 tablet (1 mg total) by mouth at bedtime as needed for anxiety. 30 tablet 0   atorvastatin (LIPITOR) 20 MG tablet TAKE 1 TABLET BY MOUTH EVERY DAY 90 tablet 3   Baclofen 5 MG TABS Take 1 tablet by mouth as needed. 90 tablet 1   DULoxetine (CYMBALTA) 30 MG capsule Take 30-60 mg by mouth daily.     metoprolol succinate (TOPROL-XL) 25 MG 24 hr tablet TAKE 1 TABLET (25 MG TOTAL) BY MOUTH DAILY. 30 tablet 0   metoprolol tartrate (LOPRESSOR) 25 MG tablet TAKE 1 TABLET BY MOUTH AS NEEDED (FOR PALPITATIONS). UP TO 4 TABLETS IN A 24 HOUR PERIOD. 180 tablet 2   Multiple Vitamin (MULTI-VITAMINS) TABS Take by mouth.     ondansetron (ZOFRAN-ODT) 4 MG disintegrating tablet Take 1 tablet (4 mg total) by mouth every 8 (eight) hours as needed for nausea or vomiting. 20 tablet 0   OVER THE COUNTER MEDICATION DELTA 9 Gummy takes one at night     pantoprazole (PROTONIX) 40 MG tablet Take 40 mg by mouth daily.     promethazine (PHENERGAN) 25 MG suppository  Place 1 suppository (25 mg total) rectally every 6 (six) hours as needed for nausea or vomiting. 12 each 0   traMADol (ULTRAM) 50 MG tablet Take 1 tablet by mouth daily as needed.     Facility-Administered Medications Prior to Visit  Medication Dose Route Frequency Provider Last Rate Last Admin   0.9 %  sodium chloride infusion  500 mL Intravenous Continuous Hilarie Fredrickson, MD  Objective:   Physical Exam  Vitals:   06/03/22 1142  BP: 134/74  Pulse: 64  Temp: 98 F (36.7 C)  TempSrc: Oral  SpO2: 95%  Weight: 170 lb 6.4 oz (77.3 kg)  Height: 5\' 5"  (1.651 m)   Gen: Pleasant, well-nourished, in no distress,  normal affect  ENT: No lesions,  mouth clear,  oropharynx clear, no postnasal drip  Neck: No JVD, no stridor  Lungs: No use of accessory muscles, no crackles or wheezing on normal respiration, no wheeze on forced expiration  Cardiovascular: RRR, heart sounds normal, no murmur or gallops, no peripheral edema  Musculoskeletal: No deformities, no cyanosis or clubbing  Neuro: alert, awake, non focal  Skin: Warm, no lesions or rash      Assessment & Plan:  Abnormal CT of the chest Reviewed her CT chest with her today.  The subtle tree-in-bud micronodular disease noted in January has resolved on the most recent scan.  Good news.  Reassured her about this.  Suspect that it was related to a concomitant viral URI.  No indication for further imaging unless she has a clinical change.  Cough Sounds upper airway in nature.  She had normal pulmonary function testing in 2013 but it might be beneficial to repeat these if her cough persists.  For now I think we should more aggressively treat her GERD, rhinitis for short period of time and see if we can break the cough cycle.  She will use Tessalon Perles for cough suppression.  If her cough persists then we will arrange for PFT, consider bronchoscopy.  She had an reassuring ENT evaluation in the past  Levy Pupa, MD,  PhD 06/03/2022, 12:11 PM [facility] [phone] or if no answer before 7:00PM call [phone] For any issues after 7:00PM please call eLink [phone]

## 2022-06-03 NOTE — Assessment & Plan Note (Signed)
Sounds upper airway in nature.  She had normal pulmonary function testing in 2013 but it might be beneficial to repeat these if her cough persists.  For now I think we should more aggressively treat her GERD, rhinitis for short period of time and see if we can break the cough cycle.  She will use Tessalon Perles for cough suppression.  If her cough persists then we will arrange for PFT, consider bronchoscopy.  She had an reassuring ENT evaluation in the past

## 2022-06-03 NOTE — Assessment & Plan Note (Signed)
Reviewed her CT chest with her today.  The subtle tree-in-bud micronodular disease noted in January has resolved on the most recent scan.  Good news.  Reassured her about this.  Suspect that it was related to a concomitant viral URI.  No indication for further imaging unless she has a clinical change.

## 2022-06-11 ENCOUNTER — Other Ambulatory Visit: Payer: Self-pay | Admitting: Internal Medicine

## 2022-06-11 ENCOUNTER — Encounter: Payer: Self-pay | Admitting: Adult Health

## 2022-06-16 NOTE — Telephone Encounter (Signed)
Dr. Vickey Huger,   Please see messaging below the patient's ONO did not require oxygen but h the patient read some wording ont he report that suggested her CPAP settings should be adjusted.  However her most recent download looked excellent.  Please let me know your thoughts

## 2022-06-18 ENCOUNTER — Other Ambulatory Visit: Payer: Self-pay | Admitting: Internal Medicine

## 2022-06-30 ENCOUNTER — Other Ambulatory Visit: Payer: Self-pay | Admitting: Internal Medicine

## 2022-07-11 ENCOUNTER — Telehealth: Payer: Self-pay | Admitting: Internal Medicine

## 2022-07-11 MED ORDER — METOPROLOL SUCCINATE ER 25 MG PO TB24: 25.0000 mg | ORAL_TABLET | Freq: Every day | ORAL | 0 refills | Status: DC

## 2022-07-11 NOTE — Telephone Encounter (Signed)
Pt's medication was sent to pt's pharmacy as requested. Confirmation received.  °

## 2022-07-11 NOTE — Telephone Encounter (Signed)
Pt c/o medication issue:  1. Name of Medication: metoprolol succinate (TOPROL-XL) 25 MG 24 hr tablet   2. How are you currently taking this medication (dosage and times per day)? TAKE 1 TABLET (25 MG TOTAL) BY MOUTH DAILY.   3. Are you having a reaction (difficulty breathing--STAT)? No   4. What is your medication issue? Patient is out of refills and needs a 90 day, 3 refill prescription sent in to  [id number] - [place],  - [address]

## 2022-08-06 ENCOUNTER — Ambulatory Visit: Payer: 59 | Admitting: Emergency Medicine

## 2022-09-19 ENCOUNTER — Encounter: Payer: Self-pay | Admitting: Internal Medicine

## 2022-09-19 ENCOUNTER — Ambulatory Visit: Payer: 59 | Attending: Internal Medicine | Admitting: Internal Medicine

## 2022-09-19 VITALS — BP 124/80 | HR 68 | Ht 64.5 in | Wt 158.6 lb

## 2022-09-19 DIAGNOSIS — R002 Palpitations: Secondary | ICD-10-CM | POA: Diagnosis not present

## 2022-09-19 NOTE — Patient Instructions (Signed)
Medication Instructions:  Your physician recommends that you continue on your current medications as directed. Please refer to the Current Medication list given to you today.  *If you need a refill on your cardiac medications before your next appointment, please call your pharmacy*  Lab Work: None ordered.  If you have labs (blood work) drawn today and your tests are completely normal, you will receive your results only by: MyChart Message (if you have MyChart) OR A paper copy in the mail If you have any lab test that is abnormal or we need to change your treatment, we will call you to review the results.  Testing/Procedures: None ordered.  Follow-Up: At [facility], you and your health needs are our priority.  As part of our continuing mission to provide you with exceptional heart care, we have created designated Provider Care Teams.  These Care Teams include your primary Cardiologist (physician) and Advanced Practice Providers (APPs -  Physician Assistants and Nurse Practitioners) who all work together to provide you with the care you need, when you need it.  Your next appointment:   1 year(s)  The format for your next appointment:   In Person  Provider:   Lewayne Bunting, MD{or one of the following Advanced Practice Providers on your designated Care Team:   Francis Dowse, [place] Casimiro Needle "Mardelle Matte" [place], [place] Earnest Rosier, NP  Important Information About Sugar

## 2022-09-19 NOTE — Progress Notes (Signed)
HPI [name] returns today for followup of her palpitations. She is a pleasant 57 yo woman with a h/o PVC"s and prior need for flecainide. Most recently she has been well controlled using long and short acting metoprolol. She rarely has to use the short acting metoprolol. She had lost 40 lbs but then developed some back problems and was placed on lyrica and has gained back 15 lbs. She has gone back to playing pickle ball and has lost almost 10 lbs back. She had not had more than an occaisional palpitation. Allergies  Allergen Reactions   Methocarbamol     Other reaction(s): Redness   Covera-Hs [Verapamil]     Blood blisters, Stevens-Johnson syndrome   Crestor [Rosuvastatin Calcium]     Flu like symptoms, on different statin   Wellbutrin [Bupropion] Palpitations     Current Outpatient Medications  Medication Sig Dispense Refill   atorvastatin (LIPITOR) 20 MG tablet TAKE 1 TABLET BY MOUTH EVERY DAY 90 tablet 3   Baclofen 5 MG TABS Take 1 tablet by mouth as needed. 90 tablet 1   DULoxetine (CYMBALTA) 30 MG capsule Take 30-60 mg by mouth daily.     LORazepam (ATIVAN) 1 MG tablet Take 2 mg by mouth at bedtime.     metoprolol succinate (TOPROL-XL) 25 MG 24 hr tablet Take 1 tablet (25 mg total) by mouth daily. 90 tablet 0   metoprolol tartrate (LOPRESSOR) 25 MG tablet TAKE 1 TABLET BY MOUTH AS NEEDED (FOR PALPITATIONS). UP TO 4 TABLETS IN A 24 HOUR PERIOD. 180 tablet 2   Multiple Vitamin (MULTI-VITAMINS) TABS Take by mouth.     OVER THE COUNTER MEDICATION DELTA 9 Gummy takes one at night     pantoprazole (PROTONIX) 40 MG tablet Take 40 mg by mouth daily.     tiZANidine (ZANAFLEX) 4 MG tablet Take 4 mg by mouth 3 (three) times daily.     traMADol (ULTRAM) 50 MG tablet Take 1 tablet by mouth daily as needed.     Current Facility-Administered Medications  Medication Dose Route Frequency Provider Last Rate Last Admin   0.9 %  sodium chloride infusion  500 mL Intravenous Continuous  Hilarie Fredrickson, MD         Past Medical History:  Diagnosis Date   Anxiety    Arrhythmia ventricular    s/p ablation 2001   Carpal tunnel syndrome, bilateral    Chronic RLQ pain    Dyslipidemia    Dysrhythmia    V- tach- resolved with ablation 2002   GERD (gastroesophageal reflux disease)    Headache    Hiatal hernia    History of migraine headaches    IBS (irritable bowel syndrome)    Insomnia    Menorrhagia    Mitral valve disorder    MVP (mitral valve prolapse)    Plantar fasciitis    PONV (postoperative nausea and vomiting)    Uterine fibroid    small   Ventricular tachycardia (HCC)     ROS:   All systems reviewed and negative except as noted in the HPI.   Past Surgical History:  Procedure Laterality Date   ABDOMINAL HYSTERECTOMY     BILATERAL SALPINGECTOMY Bilateral 12/07/2013   Procedure: BILATERAL SALPINGECTOMY;  Surgeon: Lenoard Aden, MD;  Location: [facility];  Service: Gynecology;  Laterality: Bilateral;   CARDIAC ELECTROPHYSIOLOGY MAPPING AND ABLATION  2002   cspine  05/1999   c5-6   echocardiogram  2000/2001   KNEE SURGERY  1998   PATELLA ARTHROPLASTY     ROBOTIC ASSISTED TOTAL HYSTERECTOMY N/A 12/07/2013   Procedure: ROBOTIC ASSISTED TOTAL HYSTERECTOMY;  Surgeon: Lenoard Aden, MD;  Location: [facility];  Service: Gynecology;  Laterality: N/A;   SPINE SURGERY       Family History  Problem Relation Age of Onset   Hiatal hernia Mother    Irritable bowel syndrome Mother    Hypertension Mother    Thyroid disease Mother    Heart disease Mother 45       AMI, 90% LAD, stent placement, cardiac arrest   Lung cancer Father        (+ TOBACCO)   Hypertension Sister    Colitis Maternal Grandmother        Crohns VS [facility]   Seizures Sister    Headache Sister    Pancreatic cancer Paternal Uncle    Colon cancer Neg Hx      Social History   Socioeconomic History   Marital status: Married    Spouse name: [name]   Number of children: 2   Years of  education: 16   Highest education level: Bachelor's degree (e.g., BA, AB, BS)  Occupational History   Occupation: Patent attorney: principal [employer]   Tobacco Use   Smoking status: Never   Smokeless tobacco: Never  Vaping Use   Vaping status: Never Used  Substance and Sexual Activity   Alcohol use: Yes    Alcohol/week: 5.0 standard drinks of alcohol    Types: 5 Glasses of wine per week    Comment: occ   Drug use: No   Sexual activity: Yes    Partners: Male    Birth control/protection: None    Comment: h/o infertility  Other Topics Concern   Not on file  Social History Narrative   Patient is married [name]) lives with her husband (2nd marriage, [employer]). Two daughters live with them.  Oldest daughter is grown and lives locally with her husband and children.   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     Patient has a Multimedia programmer.   Patient is right-handed.   Patient drinks one cup of caffeine daily.   Social Determinants of Health   Financial Resource Strain: Low Risk  (07/22/2022)   Received from [facility]   Overall Financial Resource Strain (CARDIA)    Difficulty of Paying Living Expenses: Not hard at all  Food Insecurity: No Food Insecurity (07/22/2022)   Received from [facility]   Hunger Vital Sign    Worried About Running Out of Food in the Last Year: Never true    Ran Out of Food in the Last Year: Never true  Transportation Needs: No Transportation Needs (07/22/2022)   Received from [facility] - Transportation    Lack of Transportation (Medical): No    Lack of Transportation (Non-Medical): No  Physical Activity: Insufficiently Active (07/22/2022)   Received from [facility]   Exercise Vital Sign    Days of Exercise per Week: 4 days    Minutes of Exercise per Session: 30 min  Stress: No Stress Concern Present (07/22/2022)   Received from [facility] - Occupational Stress Questionnaire    Feeling of Stress : Not at all  Social Connections: Moderately Integrated (07/22/2022)   Received from [facility]   Social Network    How would you  rate your social network (family, work, friends)?: Adequate participation with social networks  Intimate Partner Violence: Not At Risk (07/22/2022)   Received from [facility]   HITS    Over the last 12 months how often did your partner physically hurt you?: 1    Over the last 12 months how often did your partner insult you or talk down to you?: 1    Over the last 12 months how often did your partner threaten you with physical harm?: 1    Over the last 12 months how often did your partner scream or curse at you?: 1     BP 124/80   Pulse 68   Ht 5' 4.5" (1.638 m)   Wt 158 lb 9.6 oz (71.9 kg)   LMP 12/03/2014   SpO2 98%   BMI 26.80 kg/m   Physical Exam:  Well appearing NAD HEENT: Unremarkable Neck:  No JVD, no thyromegally Lymphatics:  No adenopathy Back:  No CVA tenderness Lungs:  Clear HEART:  Regular rate rhythm, no murmurs, no rubs, no clicks Abd:  soft, positive bowel sounds, no organomegally, no rebound, no guarding Ext:  2 plus pulses, no edema, no cyanosis, no clubbing Skin:  No rashes no nodules Neuro:  CN II through XII intact, motor grossly intact  EKG - nsr  DEVICE  Normal device function.  See PaceArt for details.   Assess/Plan:  Palpitations - her symptoms are controlled. She will continue the low dose beta blocker and is encouraged to avoid caffeine and ETOH.  Weight gain - she is encouraged to lose weight. We discussed increased exercise playing pickle ball. Sharlot Gowda Alenah Sarria,MD

## 2022-10-08 ENCOUNTER — Other Ambulatory Visit: Payer: Self-pay | Admitting: Internal Medicine

## 2022-10-31 ENCOUNTER — Other Ambulatory Visit: Payer: Self-pay | Admitting: Obstetrics and Gynecology

## 2022-10-31 DIAGNOSIS — Z1231 Encounter for screening mammogram for malignant neoplasm of breast: Secondary | ICD-10-CM

## 2022-11-05 ENCOUNTER — Other Ambulatory Visit: Payer: Self-pay | Admitting: Obstetrics and Gynecology

## 2022-11-05 ENCOUNTER — Ambulatory Visit
Admission: RE | Admit: 2022-11-05 | Discharge: 2022-11-05 | Disposition: A | Discharge status: Home / Self Care | Payer: 59 | Source: Ambulatory Visit | Attending: Obstetrics and Gynecology | Admitting: Obstetrics and Gynecology

## 2022-11-05 DIAGNOSIS — Z1231 Encounter for screening mammogram for malignant neoplasm of breast: Secondary | ICD-10-CM

## 2022-11-11 ENCOUNTER — Other Ambulatory Visit: Payer: Self-pay | Admitting: Obstetrics and Gynecology

## 2022-11-11 DIAGNOSIS — R928 Other abnormal and inconclusive findings on diagnostic imaging of breast: Secondary | ICD-10-CM

## 2022-11-24 ENCOUNTER — Ambulatory Visit: Payer: Medicare Other | Admitting: Adult Health

## 2022-11-26 ENCOUNTER — Ambulatory Visit
Admission: RE | Admit: 2022-11-26 | Discharge: 2022-11-26 | Disposition: A | Discharge status: Home / Self Care | Payer: 59 | Source: Ambulatory Visit | Attending: Obstetrics and Gynecology | Admitting: Obstetrics and Gynecology

## 2022-11-26 DIAGNOSIS — R928 Other abnormal and inconclusive findings on diagnostic imaging of breast: Secondary | ICD-10-CM

## 2023-01-27 ENCOUNTER — Ambulatory Visit: Payer: 59 | Admitting: Neurology

## 2023-01-27 ENCOUNTER — Encounter: Payer: Self-pay | Admitting: Neurology

## 2023-01-27 VITALS — BP 115/65 | HR 66 | Ht 64.0 in | Wt 160.4 lb

## 2023-01-27 DIAGNOSIS — G4701 Insomnia due to medical condition: Secondary | ICD-10-CM

## 2023-01-27 DIAGNOSIS — G4733 Obstructive sleep apnea (adult) (pediatric): Secondary | ICD-10-CM

## 2023-01-27 DIAGNOSIS — R5382 Chronic fatigue, unspecified: Secondary | ICD-10-CM

## 2023-01-27 DIAGNOSIS — M5030 Other cervical disc degeneration, unspecified cervical region: Secondary | ICD-10-CM | POA: Diagnosis not present

## 2023-01-27 DIAGNOSIS — R208 Other disturbances of skin sensation: Secondary | ICD-10-CM

## 2023-01-27 DIAGNOSIS — G8929 Other chronic pain: Secondary | ICD-10-CM

## 2023-01-27 NOTE — Patient Instructions (Signed)
58 - year- old female here with:     1) chronic insomnia, while compliant on CPAP 100% . Never waking up refreshed, but helped by Armodafinil.  There may also be a misperception of sleep -as many patients feel that they could not possibly have slept more than 2 hours/h some times the EEG recordings may have traced 5 or 6 hours of sleep.    At least this would help to qualify the findings and streamline therapies as far as necessary. Since his snoring began after cervical spinal fusion    HST ordered for baseline.   2) chromic fatigue , soft tissue pain.  Lets get a rheumatological panel. Check  Eptein Barr virus, Lyme as well.   Continue your exercises, push your limits, get massages, warm water therapy.    3) I would support Modafinil to be continued. I like for her to continue see the Fibromyalgia specialist in [place]  Continue Tizanidine, tramadol prn.  I plan to follow up either personally or through our NP within 6 months.

## 2023-01-27 NOTE — Progress Notes (Signed)
Provider:  Melvyn Novas, MD  Primary Care Physician:  Porfirio Oar, PA [address] [place] [address]     Referring Provider: Porfirio Oar, Pa [address] [place],  [place] [address]          Chief Complaint according to patient   Patient presents with:     New problem , established Patient (Initial Visit)           HISTORY OF PRESENT ILLNESS:  [name] is a 58 y.o. female patient who is here for revisit 01/27/2023 for  sleep problems. Difficulties to initiate and maintain sleep.  She stopped working in late 2019, her daughter just finished medical school at [employer] and she is feeling not under particular pressure.   Yet, she has some times trouble to initiate sleep, some night for hours. Belsomra failed to help. Armodafinil helps to stay awake in daytime, less fatigued but still only getting 5 hurs of sleep most night.  Implemented sleep hygiene, not looking at the clock, she is baffled why she still only get 4-5 hours of sleep. She is a CPAP user, her sleep had [facility].  Here with a download of her CPAP " I have seen her for migraine.  She has had a anterior fusion still has bulging cervical discs, and there is neck pain. Fibromyalgia  dx was " floated ". She had been dx with Lyme disease and she had a classic bulls eye rash, doxycycline was given and she feels in retrospect the beginning of her soft tissue pain lies at that point.  Cymbalta , Tizanidine is taken every night and tramadol prn.    The patient's compliance report for her CPAP is excellent 100% for days and hours with 9 hours on average.  She has been using an AirSense 10 AutoSet between 6 and 12 cm water pressure was 2 cm EPR.  Serial [id number].  The residual AHI is 1.8 and of these residuals all obstructive events.  The 95th percentile air leak is 11.4 L/min which is mild to moderate.  The 95th percentile pressure on CPAP is 9.6  cm water and here again this is a average pressure Ameeth.  I would probably give the patient an additional centimeter of pressure because she is straddles the upper setting.   She has been on the above-named medication she has failed both ,she sometimes has taken [place] but it does not seem to have given her much help and I am listing trazodone , which also failed to help.    Chief concern according to patient :  " I just ache, I am having a lot of pain, and I can't stay asleep, have difficulties falling asleep".   [name] is a 58 y.o. female who used to be seen for Migraines, and wants now to be seen for sleep apnea. She had a HST through [facility], and was prescribed CPAP. She frequently wakes still up, she had through [facility] been scheduled for ONO. Apparently abnormal result, as she was furbished with an oxygen concentrator.  She is followed by Dr. Everlene Other and Junius Creamer, PA.  Carried a diagnosis of insomnia, chronic and migraine, fibromyalgia, and now OSA.   She had the following tests- I am not privy to the data, but the 05-2017 HST documented an AHI of 30.1/ h, )2 Nadir was 78%. Length of desaturation was 16 seconds.  She  was placed on autotitration- 5 through 15 cm water , 3 cm EPR - AHI of 1.2 /h and ONO July 2019, 21 minutes total desat. Time, desaturation 9/h.  CPAP compliance - 97 %.      Review of Systems: Out of a complete 14 system review, the patient complains of only the following symptoms, and all other reviewed systems are negative.:  Fatigue, sleepiness , snoring, fragmented sleep, short sleep-   How likely are you to doze in the following situations: 0 = not likely, 1 = slight chance, 2 = moderate chance, 3 = high chance   Sitting and Reading? Watching Television? Sitting inactive in a public place (theater or meeting)? As a passenger in a car for an hour without a break? Lying down in the afternoon when circumstances permit? Sitting and talking to  someone? Sitting quietly after lunch without alcohol? In a car, while stopped for a few minutes in traffic?   Total = Fatigue severity scale was endorsed at 47 out of 63 points and especially important are the answer to the questions if fatigue is among the most disabling symptoms which she endorsed and fatigue interferes with work life family life social life which she endorsed.  She has felt better using modafinil then without the medication but she still has a lot of chronic pain achiness, soreness.  This may also affect the sleep quality.  Epworth sleepiness scale was endorsed at only 2 out of 24 points so I am not worried about her falling asleep at the wheel for example.  .   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 Two daughters live with them.  Oldest daughter is grown and lives locally with her husband and children.   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     Patient has a Multimedia programmer.   Patient is right-handed.   Patient drinks one cup of caffeine daily.   Social Drivers of Corporate investment banker Strain: Low Risk  (07/22/2022)   Received from [facility]    Overall Financial Resource Strain (CARDIA)    Difficulty of Paying Living Expenses: Not hard at all  Food Insecurity: No Food Insecurity (07/22/2022)   Received from [facility]   Hunger Vital Sign    Worried About Running Out of Food in the Last Year: Never true    Ran Out of Food in the Last Year: Never true  Transportation Needs: No Transportation Needs (07/22/2022)   Received from [facility] - Transportation  Lack of Transportation (Medical): No    Lack of Transportation (Non-Medical): No  Physical Activity: Insufficiently Active (07/22/2022)   Received from [facility]   Exercise Vital Sign    Days of Exercise per Week: 4 days    Minutes of Exercise per Session: 30 min  Stress: No Stress Concern Present (07/22/2022)   Received from [facility] - Occupational Stress Questionnaire    Feeling of Stress : Not at all  Social Connections: Moderately Integrated (07/22/2022)   Received from [facility]   Social Network    How would you rate your social network (family, work, friends)?: Adequate participation with social networks    Family History  Problem Relation Age of Onset   Hiatal hernia Mother    Irritable bowel syndrome Mother    Hypertension Mother    Thyroid disease Mother    Heart disease Mother 22       AMI, 90% LAD, stent placement, cardiac arrest   Lung cancer Father        (+ TOBACCO)   Hypertension Sister    Colitis Maternal Grandmother        Crohns VS U.C   Seizures Sister    Headache Sister    Pancreatic cancer Paternal Uncle    Colon cancer Neg Hx     Past Medical History:  Diagnosis Date   Anxiety    Arrhythmia ventricular    s/p ablation 2001   Carpal tunnel syndrome, bilateral    Chronic RLQ pain    Dyslipidemia    Dysrhythmia    V- tach- resolved with ablation 2002   GERD (gastroesophageal reflux disease)    Headache    Hiatal hernia    History of migraine headaches    IBS  (irritable bowel syndrome)    Insomnia    Menorrhagia    Mitral valve disorder    MVP (mitral valve prolapse)    Plantar fasciitis    PONV (postoperative nausea and vomiting)    Uterine fibroid    small   Ventricular tachycardia (HCC)     Past Surgical History:  Procedure Laterality Date   ABDOMINAL HYSTERECTOMY     BILATERAL SALPINGECTOMY Bilateral 12/07/2013   Procedure: BILATERAL SALPINGECTOMY;  Surgeon: Lenoard Aden, MD;  Location: [facility];  Service: Gynecology;  Laterality: Bilateral;   BREAST IMPLANT REMOVAL Bilateral 10/2020   CARDIAC ELECTROPHYSIOLOGY MAPPING AND ABLATION  01/07/2000   cspine  05/07/1999   c5-6   echocardiogram  2000/2001   KNEE SURGERY  01/07/1996   PATELLA ARTHROPLASTY     ROBOTIC ASSISTED TOTAL HYSTERECTOMY N/A 12/07/2013   Procedure: ROBOTIC ASSISTED TOTAL HYSTERECTOMY;  Surgeon: Lenoard Aden, MD;  Location: [facility];  Service: Gynecology;  Laterality: N/A;   SPINE SURGERY       Current Outpatient Medications on File Prior to Visit  Medication Sig Dispense Refill   ALPRAZolam (XANAX) 1 MG tablet Take 1 mg by mouth at bedtime as needed.     Armodafinil 150 MG tablet Take 150 mg by mouth every morning.     atorvastatin (LIPITOR) 20 MG tablet TAKE 1 TABLET BY MOUTH EVERY DAY 90 tablet 3   DULoxetine (CYMBALTA) 30 MG capsule Take 30-60 mg by mouth daily.     Eszopiclone 3 MG TABS Take 3 mg by mouth at bedtime. Take immediately before bedtime     metoprolol succinate (TOPROL-XL) 25 MG 24 hr tablet TAKE 1 TABLET (25 MG TOTAL) BY  MOUTH DAILY. 30 tablet 6   metoprolol tartrate (LOPRESSOR) 25 MG tablet TAKE 1 TABLET BY MOUTH AS NEEDED (FOR PALPITATIONS). UP TO 4 TABLETS IN A 24 HOUR PERIOD. 180 tablet 2   Multiple Vitamin (MULTI-VITAMINS) TABS Take by mouth.     MYRBETRIQ 25 MG TB24 tablet Take 25 mg by mouth daily.     OVER THE COUNTER MEDICATION DELTA 9 Gummy takes one at night     pantoprazole (PROTONIX) 40 MG tablet Take 40 mg by mouth daily.      tiZANidine (ZANAFLEX) 4 MG tablet Take 4 mg by mouth 3 (three) times daily.     traMADol (ULTRAM) 50 MG tablet Take 1 tablet by mouth daily as needed.     Baclofen 5 MG TABS Take 1 tablet by mouth as needed. (Patient not taking: Reported on 01/27/2023) 90 tablet 1   LORazepam (ATIVAN) 1 MG tablet Take 2 mg by mouth at bedtime. (Patient not taking: Reported on 01/27/2023)     Current Facility-Administered Medications on File Prior to Visit  Medication Dose Route Frequency Provider Last Rate Last Admin   0.9 %  sodium chloride infusion  500 mL Intravenous Continuous Hilarie Fredrickson, MD        Allergies  Allergen Reactions   Methocarbamol     Other reaction(s): Redness   Covera-Hs [Verapamil]     Blood blisters, Stevens-Johnson syndrome   Crestor [Rosuvastatin Calcium]     Flu like symptoms, on different statin   Wellbutrin [Bupropion] Palpitations     DIAGNOSTIC DATA (LABS, IMAGING, TESTING) - I reviewed patient records, labs, notes, testing and imaging myself where available.  Lab Results  Component Value Date   WBC 8.9 05/02/2022   HGB 15.2 (H) 05/02/2022   HCT 42.8 05/02/2022   MCV 88.1 05/02/2022   PLT 272 05/02/2022      Component Value Date/Time   NA 138 05/02/2022 1900   NA 139 07/08/2016 0915   K 3.3 (L) 05/02/2022 1900   CL 102 05/02/2022 1900   CO2 22 05/02/2022 1900   GLUCOSE 110 (H) 05/02/2022 1900   BUN 23 (H) 05/02/2022 1900   BUN 19 07/08/2016 0915   CREATININE 0.74 05/02/2022 1900   CREATININE 0.71 07/03/2015 0905   CALCIUM 9.9 05/02/2022 1900   PROT 8.6 (H) 05/02/2022 1900   PROT 6.4 07/08/2016 0915   ALBUMIN 5.1 (H) 05/02/2022 1900   ALBUMIN 4.3 07/08/2016 0915   AST 26 05/02/2022 1900   ALT 23 05/02/2022 1900   ALKPHOS 49 05/02/2022 1900   BILITOT 1.4 (H) 05/02/2022 1900   BILITOT 0.4 07/08/2016 0915   GFRNONAA >60 05/02/2022 1900   GFRAA >60 07/20/2018 1934   Lab Results  Component Value Date   CHOL 222 (H) 07/08/2016   HDL 58 07/08/2016    LDLCALC 127 (H) 07/08/2016   TRIG 186 (H) 07/08/2016   CHOLHDL 3.8 07/08/2016   Lab Results  Component Value Date   HGBA1C 5.2 07/08/2016   No results found for: "VITAMINB12" Lab Results  Component Value Date   TSH 2.508 01/02/2015    PHYSICAL EXAM:  Today's Vitals   01/27/23 0823  BP: 115/65  Pulse: 66  Weight: 160 lb 6.4 oz (72.8 kg)  Height: 5\' 4"  (1.626 m)   Body mass index is 27.53 kg/m.   Wt Readings from Last 3 Encounters:  01/27/23 160 lb 6.4 oz (72.8 kg)  09/19/22 158 lb 9.6 oz (71.9 kg)  06/03/22 170 lb 6.4 oz (77.3  kg)     Ht Readings from Last 3 Encounters:  01/27/23 5\' 4"  (1.626 m)  09/19/22 5' 4.5" (1.638 m)  06/03/22 5\' 5"  (1.651 m)      General: The patient is awake, alert and appears not in acute distress. The patient is well groomed. Head: Normocephalic, atraumatic. Neck is supple. The patient is awake, alert and appears not in acute distress. The patient is well groomed. Head: Normocephalic, atraumatic.  Neck is supple. Mallampati  2-3 , not very tight.  neck circumference:15. 25" . Nasal airflow seasonal affected , rhinitis. Mild Retrognathia.  Cardiovascular:  Regular rate and rhythm, without  murmurs or carotid bruit, and without distended neck veins. Respiratory: Lungs are clear to auscultation. Skin:  Without evidence of edema, or rash Trunk: BMI is elevated at 33 kg/m2 . The patient's posture is stooped.    Neurologic exam : The patient is awake and alert, oriented to place and time.    Attention span & concentration ability appears normal.  Speech is fluent,  without dysarthria, dysphonia or aphasia.  Mood and affect are appropriate.   Cranial nerves: Pupils are equal and briskly reactive to light. Funduscopic exam without  evidence of pallor or edema. Extraocular movements  in vertical and horizontal planes intact and without nystagmus. Visual fields by finger perimetry are intact. Hearing to finger rub intact. Intact sensation intact  to fine touch. Facial motor strength is symmetric and tongue and uvula move midline. Shoulder shrug was symmetrical.    Motor exam:  Normal tone, muscle bulk and symmetric strength in all extremities.   Sensory:  Fine touch, pinprick and vibration were tested in all extremities. The sense of vibration is decreased in her ankles- without edema. Knee and elbow are sensitive to vibration.  Proprioception tested in the upper extremities was normal.   Coordination: Rapid alternating movements in the fingers/hands was normal. Finger-to-nose maneuver  normal without evidence of ataxia, dysmetria or tremor.   Gait and station: Patient walks without assistive device and is able unassisted to walk, no need for bracing.  Strength within normal limits.  Stance is stable and normal.  Turns with  3 Steps.  Deep tendon reflexes: in the upper and lower extremities are symmetrically present / intact. Babinski maneuver response is  downgoing.    ASSESSMENT AND PLAN 58 y.o. year old female  here with:    1) chronic insomnia, while compliant on CPAP 100% . Never waking up refreshed, but helped by Armodafinil.  There may also be a misperception of sleep -as many patients feel that they could not possibly have slept more than 2 hours/h some times the EEG recordings may have traced 5 or 6 hours of sleep.   At least this would help to qualify the findings and streamline therapies as far as necessary. Since his snoring began after cervical spinal fusion   HST ordered for baseline.  2) chromic fatigue , soft tissue pain.  Lets get a rheumatological panel. Check  Eptein Barr virus, Lyme as well.   Continue your exercises, push your limits, get massages, warm water therapy.   3) I would support Modafinil to be continued. I like for her to continue see the Fibromyalgia specialist in [place]  Continue Tizanidine, tramadol prn.  I plan to follow up either personally or through our NP within 6 months.   I would  like to thank Porfirio Oar, Pa [address] [place],  [place] [address] for allowing me to meet with  and to take care of this pleasant patient.   CC: I will share my notes with PCP .  After spending a total time of  45  minutes face to face and additional time for physical and neurologic examination, review of laboratory studies,  personal review of imaging studies, reports and results of other testing and review of referral information / records as far as provided in visit,   Electronically signed by: Melvyn Novas, MD 01/27/2023 8:55 AM  [facility] Board certified by The [employer] of Sleep Medicine and Diplomate of the [employer] of Sleep Medicine. Board certified In Neurology through the ABPN, Fellow of the [employer] of Neurology.

## 2023-01-28 ENCOUNTER — Encounter: Payer: Self-pay | Admitting: Neurology

## 2023-01-30 LAB — ANA W/REFLEX: ANA Titer 1: NEGATIVE

## 2023-02-02 LAB — NEUROPATHY PANEL
Angio Convert Enzyme: 48 U/L (ref 14–82)
Anti Nuclear Antibody (ANA): NEGATIVE
Rheumatoid fact SerPl-aCnc: 10.5 [IU]/mL (ref <14.0)
Sed Rate: 3 mm/h (ref 0–40)
TSH: 2.12 u[IU]/mL (ref 0.450–4.500)
Total Protein: 7.5 g/dL (ref 6.0–8.5)
Vit D, 25-Hydroxy: 65.2 ng/mL (ref 30.0–100.0)
Vitamin B-12: >2000 pg/mL — ABNORMAL HIGH (ref 232–1245)

## 2023-02-02 LAB — COMPREHENSIVE METABOLIC PANEL
ALT: 11 [IU]/L (ref 0–32)
AST: 19 [IU]/L (ref 0–40)
Albumin: 5 g/dL — ABNORMAL HIGH (ref 3.8–4.9)
Alkaline Phosphatase: 70 [IU]/L (ref 44–121)
BUN/Creatinine Ratio: 25 — ABNORMAL HIGH (ref 9–23)
BUN: 21 mg/dL (ref 6–24)
Bilirubin Total: 0.6 mg/dL (ref 0.0–1.2)
CO2: 29 mmol/L (ref 20–29)
Calcium: 9.7 mg/dL (ref 8.7–10.2)
Chloride: 97 mmol/L (ref 96–106)
Creatinine, Ser: 0.83 mg/dL (ref 0.57–1.00)
Globulin, Total: 2.5 g/dL (ref 1.5–4.5)
Glucose: 93 mg/dL (ref 70–99)
Potassium: 4.2 mmol/L (ref 3.5–5.2)
Sodium: 140 mmol/L (ref 134–144)
eGFR: 81 mL/min/{1.73_m2} (ref >59)

## 2023-02-02 LAB — CBC WITH DIFFERENTIAL/PLATELET
Basophils Absolute: 0.1 10*3/uL (ref 0.0–0.2)
Basos: 1 %
EOS (ABSOLUTE): 0.1 10*3/uL (ref 0.0–0.4)
Eos: 3 %
Hematocrit: 42.7 % (ref 34.0–46.6)
Hemoglobin: 13.6 g/dL (ref 11.1–15.9)
Immature Grans (Abs): 0 10*3/uL (ref 0.0–0.1)
Immature Granulocytes: 0 %
Lymphocytes Absolute: 1.5 10*3/uL (ref 0.7–3.1)
Lymphs: 30 %
MCH: 30.6 pg (ref 26.6–33.0)
MCHC: 31.9 g/dL (ref 31.5–35.7)
MCV: 96 fL (ref 79–97)
Monocytes Absolute: 0.4 10*3/uL (ref 0.1–0.9)
Monocytes: 8 %
Neutrophils Absolute: 2.9 10*3/uL (ref 1.4–7.0)
Neutrophils: 58 %
Platelets: 216 10*3/uL (ref 150–450)
RBC: 4.45 x10E6/uL (ref 3.77–5.28)
RDW: 12 % (ref 11.7–15.4)
WBC: 5 10*3/uL (ref 3.4–10.8)

## 2023-02-02 LAB — MULTIPLE MYELOMA PANEL, SERUM
Albumin SerPl Elph-Mcnc: 4.4 g/dL (ref 2.9–4.4)
Albumin/Glob SerPl: 1.5 (ref 0.7–1.7)
Alpha 1: 0.3 g/dL (ref 0.0–0.4)
Alpha2 Glob SerPl Elph-Mcnc: 0.8 g/dL (ref 0.4–1.0)
B-Globulin SerPl Elph-Mcnc: 1.1 g/dL (ref 0.7–1.3)
Gamma Glob SerPl Elph-Mcnc: 1 g/dL (ref 0.4–1.8)
Globulin, Total: 3.1 g/dL (ref 2.2–3.9)
IgA/Immunoglobulin A, Serum: 74 mg/dL — ABNORMAL LOW (ref 87–352)
IgG (Immunoglobin G), Serum: 828 mg/dL (ref 586–1602)
IgM (Immunoglobulin M), Srm: 126 mg/dL (ref 26–217)

## 2023-02-02 LAB — SJOGREN'S SYNDROME ANTIBODS(SSA + SSB)
ENA SSA (RO) Ab: <0.2 AI (ref 0.0–0.9)
ENA SSB (LA) Ab: <0.2 AI (ref 0.0–0.9)

## 2023-02-02 LAB — LYME DISEASE SEROLOGY W/REFLEX: Lyme Total Antibody EIA: NEGATIVE

## 2023-02-02 LAB — HEMOGLOBIN A1C
Est. average glucose Bld gHb Est-mCnc: 111 mg/dL
Hgb A1c MFr Bld: 5.5 % (ref 4.8–5.6)

## 2023-02-02 LAB — EPSTEIN BARR VRS(EBV DNA BY PCR): EBV DNA, Quant PCR, Plasma: NEGATIVE [IU]/mL

## 2023-02-02 LAB — C-REACTIVE PROTEIN: CRP: <1 mg/L (ref 0–10)

## 2023-02-02 LAB — HOMOCYSTEINE: Homocysteine: 8.2 umol/L (ref 0.0–14.5)

## 2023-02-05 ENCOUNTER — Other Ambulatory Visit: Payer: Self-pay | Admitting: Student in an Organized Health Care Education/Training Program

## 2023-02-05 DIAGNOSIS — M47816 Spondylosis without myelopathy or radiculopathy, lumbar region: Secondary | ICD-10-CM

## 2023-02-17 ENCOUNTER — Ambulatory Visit: Payer: 59 | Admitting: Neurology

## 2023-02-17 DIAGNOSIS — G8929 Other chronic pain: Secondary | ICD-10-CM

## 2023-02-17 DIAGNOSIS — G4733 Obstructive sleep apnea (adult) (pediatric): Secondary | ICD-10-CM | POA: Diagnosis not present

## 2023-02-17 DIAGNOSIS — M5030 Other cervical disc degeneration, unspecified cervical region: Secondary | ICD-10-CM

## 2023-02-17 DIAGNOSIS — R5382 Chronic fatigue, unspecified: Secondary | ICD-10-CM

## 2023-02-17 DIAGNOSIS — R208 Other disturbances of skin sensation: Secondary | ICD-10-CM

## 2023-02-18 NOTE — Progress Notes (Signed)
[facility]  [name] 58 year old female 01-Oct-1965   HOME SLEEP TEST REPORT ( by Watch PAT)   STUDY DATE:  02-18-2023   ORDERING CLINICIAN: Melvyn Novas, MD  REFERRING CLINICIAN:  Junius Creamer, [place]    CLINICAL INFORMATION/HISTORY:   [name] is a 58 y.o. female patient who is here for revisit 01/27/2023 for  sleep problems. Difficulties to initiate and maintain sleep.  She stopped working in late 2019, her daughter just finished medical school at [name] and she is feeling not under particular pressure.   Yet, she has some times trouble to initiate sleep, some night for hours. Belsomra failed to help. Armodafinil helps to stay awake in daytime, less fatigued but still only getting 5 hurs of sleep most night.  Implemented sleep hygiene, not looking at the clock, she is baffled why she still only get 4-5 hours of sleep. She is a CPAP user, her sleep had been addressed with specialists at [facility], [place].  Here with a download of her CPAP " I have seen her for migraine.  She has had a anterior fusion still has bulging cervical discs, and there is neck pain. Fibromyalgia  dx was " floated ". She had been dx with Lyme disease and she had a classic bulls- eye rash, doxycycline was given and she feels in retrospect the beginning of her soft tissue pain lies at that point.  Cymbalta , Tizanidine is taken every night and tramadol prn.     The patient's compliance report for her CPAP is excellent 100% for days and hours with 9 hours on average.  She has been using an AirSense 10 AutoSet between 6 and 12 cm water pressure was 2 cm EPR.  Serial [id number].  The residual AHI is 1.8 and of these residuals all obstructive events.  The 95th percentile air leak is 11.4 L/min which is mild to moderate.  The 95th percentile pressure on CPAP is 9.6 cm water and here again this is a average pressure Ameeth.  I would probably give the patient an additional centimeter of  pressure because she is straddles the upper setting.       Epworth sleepiness score:2 /24. 47 out of 63    BMI: 27.5  kg/m, down from 33    Neck Circumference: 15.25"   FINDINGS:   Sleep Summary:   Total Recording Time (hours, min): 12 hours      Total Sleep Time (hours, min):    7 hours 37 minutes             Percent REM (%):   13.4%                                     Respiratory Indices:   Calculated pAHI (per hour):   9.9/h                          REM pAHI:    10.8/h                                             NREM pAHI: 9.7/h  Positional AHI: The chest wall electrode stopped working after 10:45 PM and therefore no snoring and positional data available for the rest of the study until 6 AM.  For the 70+ minutes that we have data it was shown that snoring reached the threshold of 40 dB, which would not be loud.   Snoring was present for 50% of the recorded sleep time.                                                 Oxygen Saturation Statistics:   Oxygen Saturation (%) Mean: 93%              O2 Saturation Range (%):    Between 8883 and the maximum saturation of 98%                                   O2 Saturation (minutes) <89%:    0.1 minutes       Pulse Rate Statistics:   Pulse Mean (bpm):     67 bpm            Pulse Range:    Between 52 and 88 bpm             IMPRESSION:  This HST confirms the presence of very mild and all obstructive sleep apnea with an AHI that was under 10/h following AASM criteria.  There was no clinically significant hypoxia associated, no bradycardia, and unfortunately we did not have positional data for the majority of the recording.  Sleep fragmentation however was evident.   RECOMMENDATION: The patient has lost weight, but she has not gained better quality sleep.  I would ask her if she feels that she is sleeping better and has more sleep quality when using CPAP. Only then would she need to continue therapy.     If a new device is needed, the dx of OSA is still standing and  I can issue a script for a new autotitration device based on this dx.  Airsense 11, ResMED Pressure between 6-12 cm water, 2 cm EPR and heated humidification, mask of her choice.       INTERPRETING PHYSICIAN:   Melvyn Novas, MD

## 2023-02-27 ENCOUNTER — Telehealth: Payer: Self-pay | Admitting: Neurology

## 2023-02-27 ENCOUNTER — Other Ambulatory Visit: Payer: Self-pay | Admitting: Neurology

## 2023-02-27 DIAGNOSIS — R5382 Chronic fatigue, unspecified: Secondary | ICD-10-CM

## 2023-02-27 DIAGNOSIS — G8929 Other chronic pain: Secondary | ICD-10-CM

## 2023-02-27 DIAGNOSIS — M797 Fibromyalgia: Secondary | ICD-10-CM

## 2023-02-27 DIAGNOSIS — R208 Other disturbances of skin sensation: Secondary | ICD-10-CM

## 2023-02-27 DIAGNOSIS — G9332 Myalgic encephalomyelitis/chronic fatigue syndrome: Secondary | ICD-10-CM

## 2023-02-27 DIAGNOSIS — G4733 Obstructive sleep apnea (adult) (pediatric): Secondary | ICD-10-CM

## 2023-02-27 NOTE — Procedures (Signed)
[facility]  [name] 58 year old female 01-Oct-1965   HOME SLEEP TEST REPORT ( by Watch PAT)   STUDY DATE:  02-18-2023   ORDERING CLINICIAN: Melvyn Novas, MD  REFERRING CLINICIAN:  Junius Creamer, [place]    CLINICAL INFORMATION/HISTORY:   [name] is a 58 y.o. female patient who is here for revisit 01/27/2023 for  sleep problems. Difficulties to initiate and maintain sleep.  She stopped working in late 2019, her daughter just finished medical school at [name] and she is feeling not under particular pressure.   Yet, she has some times trouble to initiate sleep, some night for hours. Belsomra failed to help. Armodafinil helps to stay awake in daytime, less fatigued but still only getting 5 hurs of sleep most night.  Implemented sleep hygiene, not looking at the clock, she is baffled why she still only get 4-5 hours of sleep. She is a CPAP user, her sleep had been addressed with specialists at [facility], [place].  Here with a download of her CPAP " I have seen her for migraine.  She has had a anterior fusion still has bulging cervical discs, and there is neck pain. Fibromyalgia  dx was " floated ". She had been dx with Lyme disease and she had a classic bulls- eye rash, doxycycline was given and she feels in retrospect the beginning of her soft tissue pain lies at that point.  Cymbalta , Tizanidine is taken every night and tramadol prn.     The patient's compliance report for her CPAP is excellent 100% for days and hours with 9 hours on average.  She has been using an AirSense 10 AutoSet between 6 and 12 cm water pressure was 2 cm EPR.  Serial [id number].  The residual AHI is 1.8 and of these residuals all obstructive events.  The 95th percentile air leak is 11.4 L/min which is mild to moderate.  The 95th percentile pressure on CPAP is 9.6 cm water and here again this is a average pressure Ameeth.  I would probably give the patient an additional centimeter of  pressure because she is straddles the upper setting.       Epworth sleepiness score:2 /24. 47 out of 63    BMI: 27.5  kg/m, down from 33    Neck Circumference: 15.25"   FINDINGS:   Sleep Summary:   Total Recording Time (hours, min): 12 hours      Total Sleep Time (hours, min):    7 hours 37 minutes             Percent REM (%):   13.4%                                     Respiratory Indices:   Calculated pAHI (per hour):   9.9/h                          REM pAHI:    10.8/h                                             NREM pAHI: 9.7/h  Positional AHI: The chest wall electrode stopped working after 10:45 PM and therefore no snoring and positional data available for the rest of the study until 6 AM.  For the 70+ minutes that we have data it was shown that snoring reached the threshold of 40 dB, which would not be loud.   Snoring was present for 50% of the recorded sleep time.                                                 Oxygen Saturation Statistics:   Oxygen Saturation (%) Mean: 93%              O2 Saturation Range (%):    Between 8883 and the maximum saturation of 98%                                   O2 Saturation (minutes) <89%:    0.1 minutes       Pulse Rate Statistics:   Pulse Mean (bpm):     67 bpm            Pulse Range:    Between 52 and 88 bpm             IMPRESSION:  This HST confirms the presence of very mild and all obstructive sleep apnea with an AHI that was under 10/h following AASM criteria.  There was no clinically significant hypoxia associated, no bradycardia, and unfortunately we did not have positional data for the majority of the recording.  Sleep fragmentation however was evident.   RECOMMENDATION: The patient has lost weight, but she has not gained better quality sleep.  I would ask her if she feels that she is sleeping better and has more sleep quality when using CPAP. Only then would she need to continue therapy.     If a new device is needed, the dx of OSA is still standing and  I can issue a script for a new autotitration device based on this dx.  Airsense 11, ResMED Pressure between 6-12 cm water, 2 cm EPR and heated humidification, mask of her choice.       INTERPRETING PHYSICIAN:   Melvyn Novas, MD

## 2023-02-27 NOTE — Telephone Encounter (Signed)
[facility]  [name] 58 year old female 01-Oct-1965   HOME SLEEP TEST REPORT ( by Watch PAT)   STUDY DATE:  02-18-2023   ORDERING CLINICIAN: Melvyn Novas, MD  REFERRING CLINICIAN:  Junius Creamer, [place]    CLINICAL INFORMATION/HISTORY:   [name] is a 58 y.o. female patient who is here for revisit 01/27/2023 for  sleep problems. Difficulties to initiate and maintain sleep.  She stopped working in late 2019, her daughter just finished medical school at [name] and she is feeling not under particular pressure.   Yet, she has some times trouble to initiate sleep, some night for hours. Belsomra failed to help. Armodafinil helps to stay awake in daytime, less fatigued but still only getting 5 hurs of sleep most night.  Implemented sleep hygiene, not looking at the clock, she is baffled why she still only get 4-5 hours of sleep. She is a CPAP user, her sleep had been addressed with specialists at [facility], [place].  Here with a download of her CPAP " I have seen her for migraine.  She has had a anterior fusion still has bulging cervical discs, and there is neck pain. Fibromyalgia  dx was " floated ". She had been dx with Lyme disease and she had a classic bulls- eye rash, doxycycline was given and she feels in retrospect the beginning of her soft tissue pain lies at that point.  Cymbalta , Tizanidine is taken every night and tramadol prn.     The patient's compliance report for her CPAP is excellent 100% for days and hours with 9 hours on average.  She has been using an AirSense 10 AutoSet between 6 and 12 cm water pressure was 2 cm EPR.  Serial [id number].  The residual AHI is 1.8 and of these residuals all obstructive events.  The 95th percentile air leak is 11.4 L/min which is mild to moderate.  The 95th percentile pressure on CPAP is 9.6 cm water and here again this is a average pressure Ameeth.  I would probably give the patient an additional centimeter of  pressure because she is straddles the upper setting.       Epworth sleepiness score:2 /24. 47 out of 63    BMI: 27.5  kg/m, down from 33    Neck Circumference: 15.25"   FINDINGS:   Sleep Summary:   Total Recording Time (hours, min): 12 hours      Total Sleep Time (hours, min):    7 hours 37 minutes             Percent REM (%):   13.4%                                     Respiratory Indices:   Calculated pAHI (per hour):   9.9/h                          REM pAHI:    10.8/h                                             NREM pAHI: 9.7/h  Positional AHI: The chest wall electrode stopped working after 10:45 PM and therefore no snoring and positional data available for the rest of the study until 6 AM.  For the 70+ minutes that we have data it was shown that snoring reached the threshold of 40 dB, which would not be loud.   Snoring was present for 50% of the recorded sleep time.                                                 Oxygen Saturation Statistics:   Oxygen Saturation (%) Mean: 93%              O2 Saturation Range (%):    Between 8883 and the maximum saturation of 98%                                   O2 Saturation (minutes) <89%:    0.1 minutes       Pulse Rate Statistics:   Pulse Mean (bpm):     67 bpm            Pulse Range:    Between 52 and 88 bpm             IMPRESSION:  This HST confirms the presence of very mild and all obstructive sleep apnea with an AHI that was under 10/h following AASM criteria.  There was no clinically significant hypoxia associated, no bradycardia, and unfortunately we did not have positional data for the majority of the recording.  Sleep fragmentation however was evident.   RECOMMENDATION: The patient has lost weight, but she has not gained better quality sleep.  I would ask her if she feels that she is sleeping better and has more sleep quality when using CPAP. Only then would she need to continue therapy.     If a new device is needed, the dx of OSA is still standing and  I can issue a script for a new autotitration device based on this dx.  Airsense 11, ResMED Pressure between 6-12 cm water, 2 cm EPR and heated humidification, mask of her choice.       INTERPRETING PHYSICIAN:   Melvyn Novas, MD

## 2023-03-01 ENCOUNTER — Encounter: Payer: Self-pay | Admitting: Neurology

## 2023-03-07 ENCOUNTER — Ambulatory Visit
Admission: RE | Admit: 2023-03-07 | Discharge: 2023-03-07 | Disposition: A | Discharge status: Home / Self Care | Payer: 59 | Source: Ambulatory Visit | Attending: Student in an Organized Health Care Education/Training Program | Admitting: Student in an Organized Health Care Education/Training Program

## 2023-03-07 DIAGNOSIS — M47816 Spondylosis without myelopathy or radiculopathy, lumbar region: Secondary | ICD-10-CM

## 2023-03-09 NOTE — Progress Notes (Unsigned)
 Marland Kitchen

## 2023-03-10 ENCOUNTER — Encounter: Payer: Self-pay | Admitting: Neurology

## 2023-03-10 ENCOUNTER — Ambulatory Visit (INDEPENDENT_AMBULATORY_CARE_PROVIDER_SITE_OTHER): Payer: Medicare Other | Admitting: Neurology

## 2023-03-10 VITALS — BP 127/79 | HR 77 | Ht 64.0 in | Wt 155.2 lb

## 2023-03-10 DIAGNOSIS — R208 Other disturbances of skin sensation: Secondary | ICD-10-CM

## 2023-03-10 DIAGNOSIS — M797 Fibromyalgia: Secondary | ICD-10-CM

## 2023-03-10 DIAGNOSIS — R5382 Chronic fatigue, unspecified: Secondary | ICD-10-CM | POA: Diagnosis not present

## 2023-03-10 DIAGNOSIS — G4700 Insomnia, unspecified: Secondary | ICD-10-CM

## 2023-03-10 DIAGNOSIS — R4189 Other symptoms and signs involving cognitive functions and awareness: Secondary | ICD-10-CM

## 2023-03-10 MED ORDER — ALPRAZOLAM ER 0.5 MG PO TB24: 1.0000 mg | ORAL_TABLET | Freq: Every day | ORAL | 1 refills | Status: DC

## 2023-03-10 MED ORDER — SERTRALINE HCL 25 MG PO TABS: 25.0000 mg | ORAL_TABLET | Freq: Every day | ORAL | 5 refills | Status: DC

## 2023-03-10 NOTE — Progress Notes (Signed)
 Provider:  Melvyn Novas, MD  Primary Care Physician:  Riki Rusk, DO [address] [place] [address]     Referring Provider: Porfirio Oar, Pa [address] [place],  [place] [address]          Chief Complaint according to patient   Patient presents with:                HISTORY OF PRESENT ILLNESS:  [name] is a 58 y.o. female patient who is here for revisit 03/10/2023 for follow up on sleep study results.  .  Chief concern according to patient :  difficulties with insomnia,  and what she considered cognitively decline. Her daughter is a Surveyor, mining who discussed with her that she  may need to wean off benzodiazepines.  She has been on those for over 2 decades originally prescribed  by PCP for insomnia when she lived through her  first divorce.   She has to write everything on a list, feels distracted , inability to multitask which is not how she worked  until 10 years ago.  She felt  she couldn't get her work done.    [facility] was referred [facility] , saw a PA, changed to ativan for longer half life.  She  has taken Armodafinil which helps with sleepiness.   Her sleep study was interesting;   Epworth sleepiness score:2 /24. 47 out of 63 - Calculated pAHI (per hour):   9.9/h   REM pAHI  10.8/h  I would ask her if she feels that she is sleeping better and has more sleep quality when using CPAP. Only then would she need to continue therapy.      [name] is a 58 y.o. female patient who is here for revisit 01/27/2023 for  sleep problems. Difficulties to initiate and maintain sleep.  She stopped working in late 2019, her daughter just finished medical school at [employer] and she is feeling not under particular pressure.   Yet, she has some times trouble to initiate sleep, some night for hours. Belsomra failed to help. Armodafinil helps to stay awake in  daytime, less fatigued but still only getting 5 hurs of sleep most night.  Implemented sleep hygiene, not looking at the clock, she is baffled why she still only get 4-5 hours of sleep. She is a CPAP user, her sleep had been addressed with specialists at [facility], [place].  Here with a download of her CPAP " I have seen her for migraine.  She has had a anterior fusion still has bulging cervical discs, and there is neck pain. Fibromyalgia  dx was " floated ". She had been dx with Lyme disease and she had a classic bulls- eye rash, doxycycline was given and she feels in retrospect the beginning of her soft tissue pain lies at that point.  Cymbalta , Tizanidine is taken every night and tramadol prn.      The patient's compliance report for her CPAP is excellent 100% for days and hours with 9 hours on average.  She has been using an AirSense 10 AutoSet between 6 and 12 cm water pressure was 2 cm EPR.  Serial [id number].  The residual AHI is 1.8 and of these residuals all obstructive events.  The 95th percentile air leak is 11.4 L/min which is mild to moderate.  The 95th percentile pressure on  CPAP is 9.6 cm water and here again this is a average pressure Ameeth.  I would probably give the patient an additional centimeter of pressure because she is straddles the upper setting.      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 Two daughters live with them.  Oldest daughter is grown and lives locally with her husband and children.   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     Patient has a Multimedia programmer.   Patient is right-handed.   Patient drinks one cup of caffeine daily.   Social Drivers of Corporate investment banker Strain: Low Risk  (07/22/2022)   Received from [facility]   Overall Financial Resource Strain (CARDIA)    Difficulty of Paying Living Expenses: Not hard at all  Food Insecurity: No Food Insecurity (07/22/2022)   Received from [facility]   Hunger Vital Sign    Worried About Running Out of Food in the Last Year: Never true    Ran Out of Food in the Last Year: Never true  Transportation Needs: No Transportation Needs (07/22/2022)   Received from [facility] - Transportation    Lack of Transportation (Medical): No    Lack of Transportation (Non-Medical): No  Physical Activity: Insufficiently Active (07/22/2022)   Received from [facility]   Exercise Vital Sign    Days of Exercise per Week: 4 days    Minutes of Exercise per Session: 30 min  Stress: No Stress Concern Present (07/22/2022)   Received from [facility] - Occupational Stress Questionnaire    Feeling of Stress : Not at all  Social Connections: Moderately Integrated (07/22/2022)   Received from [facility]   Social Network    How would you rate your social network (family, work, friends)?: Adequate participation with social networks    Family History  Problem Relation Age of Onset   Hiatal hernia Mother    Irritable bowel syndrome Mother     Hypertension Mother    Thyroid disease Mother    Heart disease Mother 37       AMI, 90% LAD, stent placement, cardiac arrest   Lung cancer Father        (+ TOBACCO)   Hypertension Sister    Seizures Sister    Headache Sister    Pancreatic cancer Paternal Uncle    Colitis Maternal Grandmother  Crohns VS U.C   Colon cancer Neg Hx    Sleep apnea Neg Hx     Past Medical History:  Diagnosis Date   Anxiety    Arrhythmia ventricular    s/p ablation 2001   Carpal tunnel syndrome, bilateral    Chronic RLQ pain    Dyslipidemia    Dysrhythmia    V- tach- resolved with ablation 2002   GERD (gastroesophageal reflux disease)    Headache    Hiatal hernia    History of migraine headaches    IBS (irritable bowel syndrome)    Insomnia    Menorrhagia    Mitral valve disorder    MVP (mitral valve prolapse)    Plantar fasciitis    PONV (postoperative nausea and vomiting)    Uterine fibroid    small   Ventricular tachycardia (HCC)     Past Surgical History:  Procedure Laterality Date   ABDOMINAL HYSTERECTOMY     BILATERAL SALPINGECTOMY Bilateral 12/07/2013   Procedure: BILATERAL SALPINGECTOMY;  Surgeon: Lenoard Aden, MD;  Location: [facility];  Service: Gynecology;  Laterality: Bilateral;   BREAST IMPLANT REMOVAL Bilateral 10/2020   CARDIAC ELECTROPHYSIOLOGY MAPPING AND ABLATION  01/07/2000   cspine  05/07/1999   c5-6   echocardiogram  2000/2001   KNEE SURGERY  01/07/1996   PATELLA ARTHROPLASTY     ROBOTIC ASSISTED TOTAL HYSTERECTOMY N/A 12/07/2013   Procedure: ROBOTIC ASSISTED TOTAL HYSTERECTOMY;  Surgeon: Lenoard Aden, MD;  Location: [facility];  Service: Gynecology;  Laterality: N/A;   SPINE SURGERY       Current Outpatient Medications on File Prior to Visit  Medication Sig Dispense Refill   ALPRAZolam (XANAX) 1 MG tablet Take 1.5 mg by mouth at bedtime as needed for sleep.     Armodafinil 150 MG tablet Take 150 mg by mouth every morning.     atorvastatin (LIPITOR)  20 MG tablet TAKE 1 TABLET BY MOUTH EVERY DAY 90 tablet 3   Baclofen 5 MG TABS Take 1 tablet by mouth as needed. 90 tablet 1   DULoxetine (CYMBALTA) 30 MG capsule Take 30-60 mg by mouth daily.     Eszopiclone 3 MG TABS Take 3 mg by mouth at bedtime. Take immediately before bedtime     metoprolol succinate (TOPROL-XL) 25 MG 24 hr tablet TAKE 1 TABLET (25 MG TOTAL) BY MOUTH DAILY. 30 tablet 6   Multiple Vitamin (MULTI-VITAMINS) TABS Take by mouth.     MYRBETRIQ 25 MG TB24 tablet Take 25 mg by mouth daily.     OVER THE COUNTER MEDICATION DELTA 9 Gummy takes one at night     pantoprazole (PROTONIX) 40 MG tablet Take 40 mg by mouth daily.     traMADol (ULTRAM) 50 MG tablet Take 1 tablet by mouth as needed.     LORazepam (ATIVAN) 1 MG tablet Take 2 mg by mouth at bedtime. (Patient not taking: Reported on 01/27/2023)     metoprolol tartrate (LOPRESSOR) 25 MG tablet TAKE 1 TABLET BY MOUTH AS NEEDED (FOR PALPITATIONS). UP TO 4 TABLETS IN A 24 HOUR PERIOD. (Patient not taking: Reported on 03/10/2023) 180 tablet 2   tiZANidine (ZANAFLEX) 4 MG tablet Take 4 mg by mouth 3 (three) times daily.     Current Facility-Administered Medications on File Prior to Visit  Medication Dose Route Frequency Provider Last Rate Last Admin   0.9 %  sodium chloride infusion  500 mL Intravenous Continuous Hilarie Fredrickson, MD  Allergies  Allergen Reactions   Methocarbamol     Other reaction(s): Redness   Covera-Hs [Verapamil]     Blood blisters, Stevens-Johnson syndrome   Crestor [Rosuvastatin Calcium]     Flu like symptoms, on different statin   Wellbutrin [Bupropion] Palpitations     DIAGNOSTIC DATA (LABS, IMAGING, TESTING) - I reviewed patient records, labs, notes, testing and imaging myself where available.  Lab Results  Component Value Date   WBC 5.0 01/27/2023   HGB 13.6 01/27/2023   HCT 42.7 01/27/2023   MCV 96 01/27/2023   PLT 216 01/27/2023      Component Value Date/Time   NA 140 01/27/2023 0945    K 4.2 01/27/2023 0945   CL 97 01/27/2023 0945   CO2 29 01/27/2023 0945   GLUCOSE 93 01/27/2023 0945   GLUCOSE 110 (H) 05/02/2022 1900   BUN 21 01/27/2023 0945   CREATININE 0.83 01/27/2023 0945   CREATININE 0.71 07/03/2015 0905   CALCIUM 9.7 01/27/2023 0945   PROT 7.5 01/27/2023 0945   ALBUMIN 5.0 (H) 01/27/2023 0945   AST 19 01/27/2023 0945   ALT 11 01/27/2023 0945   ALKPHOS 70 01/27/2023 0945   BILITOT 0.6 01/27/2023 0945   GFRNONAA >60 05/02/2022 1900   GFRAA >60 07/20/2018 1934   Lab Results  Component Value Date   CHOL 222 (H) 07/08/2016   HDL 58 07/08/2016   LDLCALC 127 (H) 07/08/2016   TRIG 186 (H) 07/08/2016   CHOLHDL 3.8 07/08/2016   Lab Results  Component Value Date   HGBA1C 5.5 01/27/2023   Lab Results  Component Value Date   VITAMINB12 >2000 (H) 01/27/2023   Lab Results  Component Value Date   TSH 2.120 01/27/2023    PHYSICAL EXAM:  Today's Vitals   03/10/23 1549  BP: 127/79  Pulse: 77  Weight: 155 lb 3.2 oz (70.4 kg)  Height: 5\' 4"  (1.626 m)   Body mass index is 26.64 kg/m.   Wt Readings from Last 3 Encounters:  03/10/23 155 lb 3.2 oz (70.4 kg)  01/27/23 160 lb 6.4 oz (72.8 kg)  09/19/22 158 lb 9.6 oz (71.9 kg)     Ht Readings from Last 3 Encounters:  03/10/23 5\' 4"  (1.626 m)  01/27/23 5\' 4"  (1.626 m)  09/19/22 5' 4.5" (1.638 m)      General: The patient is awake, alert and appears not in acute distress. The patient is well groomed. Head: Normocephalic, atraumatic.Cardiovascular:  Regular rate and cardiac rhythm by pulse,  without distended neck veins. Respiratory: Lungs are clear to auscultation.  Skin:  Without evidence of ankle edema, or rash. Trunk: The patient's posture is erect.   NEUROLOGIC EXAM: The patient is awake and alert, oriented to place and time.   Memory subjective described as intact.  Attention span & concentration ability appears normal.  Speech is fluent,  without  dysarthria, dysphonia or aphasia.  Mood and  affect are appropriate.   Cranial nerves: no loss of smell or taste reported  Pupils are equal and briskly reactive to light. Funduscopic exam deferred. .  Extraocular movements in vertical and horizontal planes were intact and without nystagmus. No Diplopia. Visual fields by finger perimetry are intact. Hearing was intact to soft voice and finger rubbing.    Facial sensation intact to fine touch.  Facial motor strength is symmetric and tongue and uvula move midline.  Neck ROM : rotation, tilt and flexion extension were normal for age and shoulder shrug was symmetrical.  Motor exam:  Symmetric bulk, tone and ROM.   Normal tone without cog wheeling, symmetric grip strength .   Sensory:  Fine touch, pinprick and vibration were  normal.  Proprioception tested in the upper extremities was normal.   Coordination: Rapid alternating movements in the fingers/hands were of normal speed.  The Finger-to-nose maneuver was intact without evidence of ataxia, dysmetria or tremor.   Gait and station: Patient could rise unassisted from a seated position, walked without assistive device.  Deep tendon reflexes: in the  upper and lower extremities are symmetric and intact.  Babinski response was deferred / normal     ASSESSMENT AND PLAN 58 y.o. year old female  here with:    1) chronic insomnia , fibromyalgia, attention problems.   2) impaired cognition , chronic use of benzodiazepines at low dose.   3) neuropsychological testing  to be repeated after weaning off benzodiazepine.   Weaning from 1.5 mg Xanax to  1 mg po, po HS . I chose a 0.5 mg XR dose.  We have a better scheduling success if  we use fractioned  doses.   Zoloft 25 mg po to start.  I want to cover a general anxiety response to weaning off Xanax.  MRI brain repeat EEG ordered.   Repeat visit with Psychiatry referral  ordered.     I would like to thank Riki Rusk, DO and   Porfirio Oar, Pa [address] [place],  [place] [address] for allowing me to meet with and to take care of this pleasant patient.     After spending a total time of  35   minutes face to face and additional time for physical and neurologic examination, review of laboratory studies,  personal review of imaging studies, reports and results of other testing and review of referral information / records as far as provided in visit,   Electronically signed by: Melvyn Novas, MD 03/10/2023 4:00 PM  [facility] Board certified by The [employer] of Sleep Medicine and Diplomate of the [employer] of Sleep Medicine. Board certified In Neurology through the ABPN, Fellow of the [employer] of Neurology.

## 2023-03-10 NOTE — Patient Instructions (Addendum)
 Sertraline Tablets What is this medication? SERTRALINE (SER tra leen) treats depression, anxiety, obsessive-compulsive disorder (OCD), post-traumatic stress disorder (PTSD), and premenstrual dysphoric disorder (PMDD). It increases the amount of serotonin in the brain, a hormone that helps regulate mood. It belongs to a group of medications called SSRIs. This medicine may be used for other purposes; ask your health care provider or pharmacist if you have questions. COMMON BRAND NAME(S): Zoloft What should I tell my care team before I take this medication? They need to know if you have any of these conditions: Bleeding disorders Bipolar disorder or a family history of bipolar disorder Frequently drink alcohol Glaucoma Heart disease High blood pressure History of irregular heartbeat History of low levels of calcium, magnesium, or potassium in the blood Liver disease Receiving electroconvulsive therapy Seizures Suicidal thoughts, plans, or attempt by you or a family member Take medications that prevent or treat blood clots Thyroid disease An unusual or allergic reaction to sertraline, other medications, foods, dyes, or preservatives Pregnant or trying to get pregnant Breastfeeding How should I use this medication? Take this medication by mouth with a glass of water. Take it as directed on the prescription label at the same time every day. You can take it with or without food. If it upsets your stomach, take it with food. Do not take your medication more often than directed. Keep taking this medication unless your care team tells you to stop. Stopping it too quickly can cause serious side effects. It can also make your condition worse. A special MedGuide will be given to you by the pharmacist with each prescription and refill. Be sure to read this information carefully each time. Talk to your care team about the use of this medication in children. While it may be prescribed for children as  young as 7 years for selected conditions, precautions do apply. Overdosage: If you think you have taken too much of this medicine contact a poison control center or emergency room at once. NOTE: This medicine is only for you. Do not share this medicine with others. What if I miss a dose? If you miss a dose, take it as soon as you can. If it is almost time for your next dose, take only that dose. Do not take double or extra doses. What may interact with this medication? Do not take this medication with any of the following: Cisapride Dronedarone Linezolid MAOIs, such as Carbex, Eldepryl, Marplan, Nardil, and Parnate Methylene blue (injected into a vein) Pimozide Thioridazine This medication may also interact with the following: Alcohol Amphetamines Aspirin and aspirin-like medications Certain medications for fungal infections, such as ketoconazole, fluconazole, posaconazole, itraconazole Certain medications for irregular heart beat, such as flecainide, quinidine, propafenone Certain medications for mental health conditions Certain medications for migraine headaches, such as almotriptan, eletriptan, frovatriptan, naratriptan, rizatriptan, sumatriptan, zolmitriptan Certain medications for seizures, such as carbamazepine, valproic acid, phenytoin Certain medications for sleep Certain medications that prevent or treat blood clots, such as warfarin, enoxaparin, dalteparin Cimetidine Digoxin Diuretics Fentanyl Isoniazid Lithium NSAIDs, medications for pain and inflammation, such as ibuprofen or naproxen Other medications that cause heart rhythm changes, such as dofetilide Rasagiline Safinamide Supplements, such as St. John's wort, kava kava, valerian Tolbutamide Tramadol Tryptophan This list may not describe all possible interactions. Give your health care provider a list of all the medicines, herbs, non-prescription drugs, or dietary supplements you use. Also tell them if you smoke,  drink alcohol, or use illegal drugs. Some items may interact with your medicine.  What should I watch for while using this medication? Tell your care team if your symptoms do not get better or if they get worse. Visit your care team for regular checks on your progress. Because it may take several weeks to see the full effects of this medication, it is important to continue your treatment as prescribed by your care team. Patients and their families should watch out for new or worsening thoughts of suicide or depression. Also watch out for sudden changes in feelings such as feeling anxious, agitated, panicky, irritable, hostile, aggressive, impulsive, severely restless, overly excited and hyperactive, or not being able to sleep. If this happens, especially at the beginning of treatment or after a change in dose, call your care team. This medication may affect your coordination, reaction time, or judgment. Do not drive or operate machinery until you know how this medication affects you. Sit or stand up slowly to reduce the risk of dizzy or fainting spells. Drinking alcohol with this medication can increase the risk of these side effects. Your mouth may get dry. Chewing sugarless gum or sucking hard candy, and drinking plenty of water may help. Contact your care team if the problem does not go away or is severe. What side effects may I notice from receiving this medication? Side effects that you should report to your care team as soon as possible: Allergic reactions--skin rash, itching, hives, swelling of the face, lips, tongue, or throat Bleeding--bloody or black, tar-like stools, red or dark brown urine, vomiting blood or brown material that looks like coffee grounds, small red or purple spots on skin, unusual bleeding or bruising Heart rhythm changes--fast or irregular heartbeat, dizziness, feeling faint or lightheaded, chest pain, trouble breathing Low sodium level--muscle weakness, fatigue, dizziness,  headache, confusion Serotonin syndrome--irritability, confusion, fast or irregular heartbeat, muscle stiffness, twitching muscles, sweating, high fever, seizure, chills, vomiting, diarrhea Sudden eye pain or change in vision such as blurred vision, seeing halos around lights, vision loss Thoughts of suicide or self-harm, worsening mood Side effects that usually do not require medical attention (report these to your care team if they continue or are bothersome): Change in sex drive or performance Diarrhea Excessive sweating Nausea Tremors or shaking Upset stomach This list may not describe all possible side effects. Call your doctor for medical advice about side effects. You may report side effects to FDA at 1-800-FDA-1088. Where should I keep my medication? Keep out of the reach of children and pets. Store at room temperature between 20 and 25 degrees C (68 and 77 degrees F). Get rid of any unused medication after the expiration date. To get rid of medications that are no longer needed or expired: Take the medication to a medication take-back program. Check with your pharmacy or law enforcement to find a location. If you cannot return the medication, check the label or package insert to see if the medication should be thrown out in the garbage or flushed down the toilet. If you are not sure, ask your care team. If it is safe to put in the trash, empty the medication out of the container. Mix the medication with cat litter, dirt, coffee grounds, or other unwanted substance. Seal the mixture in a bag or container. Put it in the trash. NOTE: This sheet is a summary. It may not cover all possible information. If you have questions about this medicine, talk to your doctor, pharmacist, or health care provider.  2024 Elsevier/Gold Standard (2021-07-23 00:00:00)    58 y.o. year  old female  here with:     1) chronic insomnia , fibromyalgia, attention problems.    2) impaired cognition , chronic  use of benzodiazepines at low dose.    3) neuropsychological testing to be repeated after weaning off benzodiazepine.    Weaning from 1.5 mg Xanax to  1 mg po, po HS . I chose a 0.5 mg XR dose.  We have a better scheduling success if  we use fractioned  doses.    Zoloft 25 mg po to start.  I want to cover a general anxiety response to weaning off Xanax.  MRI brain repeat EEG ordered.    Repeat visit with Psychiatry referral  ordered.

## 2023-03-19 ENCOUNTER — Ambulatory Visit: Admitting: Neurology

## 2023-03-19 ENCOUNTER — Encounter: Payer: Self-pay | Admitting: Neurology

## 2023-03-19 DIAGNOSIS — R4189 Other symptoms and signs involving cognitive functions and awareness: Secondary | ICD-10-CM

## 2023-03-19 DIAGNOSIS — R4182 Altered mental status, unspecified: Secondary | ICD-10-CM | POA: Diagnosis not present

## 2023-03-19 DIAGNOSIS — R5382 Chronic fatigue, unspecified: Secondary | ICD-10-CM

## 2023-03-19 DIAGNOSIS — R208 Other disturbances of skin sensation: Secondary | ICD-10-CM

## 2023-03-19 DIAGNOSIS — G4700 Insomnia, unspecified: Secondary | ICD-10-CM

## 2023-03-19 DIAGNOSIS — G9332 Myalgic encephalomyelitis/chronic fatigue syndrome: Secondary | ICD-10-CM

## 2023-03-20 ENCOUNTER — Encounter: Payer: Self-pay | Admitting: Neurology

## 2023-03-20 NOTE — Procedures (Signed)
    History:  57 year old man with cognitive decline   EEG classification: Awake and drowsy  Duration: 25 minutes   Technical aspects: This EEG study was done with scalp electrodes positioned according to the 10-20 International system of electrode placement. Electrical activity was reviewed with band pass filter of 1-70Hz , sensitivity of 7 uV/mm, display speed of 70mm/sec with a 60Hz  notched filter applied as appropriate. EEG data were recorded continuously and digitally stored.   Description of the recording: The background rhythms of this recording consists of a fairly well modulated medium amplitude alpha rhythm of 11 Hz that is reactive to eye opening and closure. Present in the anterior head region is a 15-20 Hz beta activity. Photic stimulation was performed, did not show any abnormalities. Hyperventilation was also performed, did not show any abnormalities. Drowsiness was not seen. There was no epileptiform discharges seen during this recording. There was no focal slowing. There were no electrographic seizure identified.   Abnormality: None   Impression: This is a normal awake EEG. Normal EEGs, however, do not rule out epilepsy.  No evidence of interictal epileptiform discharges but there was a single sharply contoured wave in the temporal area. Consider ambulatory EEG if symptoms persist   Windell Norfolk, MD [facility]

## 2023-03-23 ENCOUNTER — Encounter: Payer: Self-pay | Admitting: Neurology

## 2023-04-01 ENCOUNTER — Other Ambulatory Visit: Payer: Self-pay | Admitting: Neurology

## 2023-04-21 ENCOUNTER — Encounter: Payer: Self-pay | Admitting: Neurology

## 2023-04-21 ENCOUNTER — Ambulatory Visit
Admission: RE | Admit: 2023-04-21 | Discharge: 2023-04-21 | Disposition: A | Discharge status: Home / Self Care | Source: Ambulatory Visit | Attending: Neurology | Admitting: Neurology

## 2023-04-21 DIAGNOSIS — G9332 Myalgic encephalomyelitis/chronic fatigue syndrome: Secondary | ICD-10-CM

## 2023-04-21 DIAGNOSIS — R5382 Chronic fatigue, unspecified: Secondary | ICD-10-CM

## 2023-04-21 DIAGNOSIS — G4700 Insomnia, unspecified: Secondary | ICD-10-CM

## 2023-04-21 DIAGNOSIS — R208 Other disturbances of skin sensation: Secondary | ICD-10-CM | POA: Diagnosis not present

## 2023-04-21 DIAGNOSIS — M797 Fibromyalgia: Secondary | ICD-10-CM

## 2023-04-21 DIAGNOSIS — R4189 Other symptoms and signs involving cognitive functions and awareness: Secondary | ICD-10-CM

## 2023-04-21 MED ORDER — GADOPICLENOL 0.5 MMOL/ML IV SOLN
7.5000 mL | Freq: Once | INTRAVENOUS | Status: AC | PRN
  Administered 2023-04-21: 7.5 mL via INTRAVENOUS

## 2023-04-22 ENCOUNTER — Ambulatory Visit: Admitting: Adult Health

## 2023-04-27 NOTE — Telephone Encounter (Signed)
 There is nearly always mild atrophy progression with each decade of life. Your brain looks well .  Benzodiazepines are suspected to predispose to dementia/ ST memory loss but not to atrophy of the brain. Atrophy , if concentrated at the deep temporal  lobe, can be a correlate for short term memory loss.   I would concentrate on neuropsychological  testing and therapies, brain engagement,  involving in social activity and physical exercise to have many happy years ahead of you- hopefully decades.   Happy easter ,  [name].

## 2023-05-20 ENCOUNTER — Telehealth: Payer: Self-pay | Admitting: Neurology

## 2023-05-20 NOTE — Telephone Encounter (Signed)
 LVM and sent mychart msg informing pt of need to reschedule 06/30/23 appt - MD out

## 2023-05-26 ENCOUNTER — Other Ambulatory Visit: Payer: Self-pay | Admitting: Internal Medicine

## 2023-06-11 ENCOUNTER — Other Ambulatory Visit (HOSPITAL_COMMUNITY): Payer: Self-pay

## 2023-06-11 ENCOUNTER — Other Ambulatory Visit: Payer: Self-pay

## 2023-06-30 ENCOUNTER — Ambulatory Visit: Admitting: Neurology

## 2023-07-28 ENCOUNTER — Ambulatory Visit: Payer: Medicare Other | Admitting: Neurology

## 2023-09-10 ENCOUNTER — Encounter: Payer: Self-pay | Admitting: Internal Medicine

## 2023-10-07 ENCOUNTER — Other Ambulatory Visit: Payer: Self-pay | Admitting: Family Medicine

## 2023-10-07 DIAGNOSIS — Z0000 Encounter for general adult medical examination without abnormal findings: Secondary | ICD-10-CM

## 2023-11-09 ENCOUNTER — Encounter: Payer: Self-pay | Admitting: Neurology

## 2023-11-09 ENCOUNTER — Ambulatory Visit: Admitting: Neurology

## 2023-11-09 VITALS — BP 108/65 | HR 65 | Ht 64.0 in | Wt 153.0 lb

## 2023-11-09 DIAGNOSIS — G9332 Myalgic encephalomyelitis/chronic fatigue syndrome: Secondary | ICD-10-CM

## 2023-11-09 DIAGNOSIS — G4733 Obstructive sleep apnea (adult) (pediatric): Secondary | ICD-10-CM | POA: Diagnosis not present

## 2023-11-09 DIAGNOSIS — M797 Fibromyalgia: Secondary | ICD-10-CM

## 2023-11-09 DIAGNOSIS — F13930 Sedative, hypnotic or anxiolytic use, unspecified with withdrawal, uncomplicated: Secondary | ICD-10-CM | POA: Insufficient documentation

## 2023-11-09 DIAGNOSIS — R4189 Other symptoms and signs involving cognitive functions and awareness: Secondary | ICD-10-CM | POA: Diagnosis not present

## 2023-11-09 DIAGNOSIS — I472 Ventricular tachycardia, unspecified: Secondary | ICD-10-CM

## 2023-11-09 NOTE — Patient Instructions (Signed)

## 2023-11-09 NOTE — Progress Notes (Signed)
 Provider:  Dedra Gores, MD  Primary Care Physician:  Elpidio Rosaline Crank, DO [address] [place] [address] [place] [address]     Referring Provider: Juliane Che, Pa [address] [place],  [place] [address]          Chief Complaint according to patient   Patient presents with:                HISTORY OF PRESENT ILLNESS:  [name] is a 58 y.o. female patient who is here for revisit 11/09/2023 for  follow up on :  normal awake EEG. Normal EEGs, however, do not rule out epilepsy.  No evidence of interictal epileptiform discharges but there was a single sharply contoured wave in the temporal area. Consider ambulatory EEG if symptoms persist   She has weaned off Benzos and she feels better- ESS 2. FSS at  47, GDS at 4/ 15. Needs audiology testing for tinnitus.   Chief concern according to patient :   3)Rising questions :  is it possible to wean off CPAP ? [name] She likes to have a cognitive test here today.       [name] is a 58 y.o. female patient who is here for revisit 01/27/2023 for  sleep problems. Difficulties to initiate and maintain sleep.  She stopped working in late 2019, her daughter just finished medical school at [employer] and she is feeling not under particular pressure.   Yet, she has some times trouble to initiate sleep, some night for hours. Belsomra failed to help. Armodafinil helps to stay awake in daytime, less fatigued but still only getting 5 hurs of sleep most night.  Implemented sleep hygiene, not looking at the clock, she is baffled why she still only get 4-5 hours of sleep. She is a CPAP user, her sleep had been addressed with specialists at [facility] [place].  Here with a download of her CPAP  I have seen her for migraine.  She has had a anterior fusion still has bulging cervical discs, and there is neck pain. Fibromyalgia  dx was  floated . She had been dx with Lyme disease and  she had a classic bulls- eye rash, doxycycline  was given and she feels in retrospect the beginning of her soft tissue pain lies at that point.  Cymbalta  , Tizanidine is taken every night and tramadol  prn.     The patient's compliance report for her CPAP is excellent 100% for days and hours with 9 hours on average.  She has been using an AirSense 10 AutoSet between 6 and 12 cm water pressure was 2 cm EPR.  Serial [id number].  The residual AHI is 1.8 and of these residuals all obstructive events.  The 95th percentile air leak is 11.4 L/min which is mild to moderate.  The 95th percentile pressure on CPAP is 9.6 cm water and here again this is a average pressure Ameeth.  I would probably give the patient an additional centimeter of pressure because she is straddles the upper setting.         Epworth sleepiness score:2 /24. 47 out of 63    BMI: 27.5  kg/m, down from 33    Neck Circumference: 15.25   FINDINGS:   Sleep Summary:   Total Recording Time (hours, min): 12 hours      Total Sleep Time (hours, min):    7 hours 37 minutes  Percent REM (%):   13.4%                                     Respiratory Indices:   Calculated pAHI (per hour):   9.9/h                          REM pAHI:    10.8/h                                             NREM pAHI: 9.7/h                             Positional AHI: The chest wall electrode stopped working after 10:45 PM and therefore no snoring and positional data available for the rest of the study until 6 AM.  For the 70+ minutes that we have data it was shown that snoring reached the threshold of 40 dB, which would not be loud.   Snoring was present for 50% of the recorded sleep time.                                                 Oxygen  Saturation Statistics:   Oxygen  Saturation (%) Mean: 93%              O2 Saturation Range (%):    Between 8883 and the maximum saturation of 98%                                   O2 Saturation  (minutes) <89%:    0.1 minutes       Pulse Rate Statistics:   Pulse Mean (bpm):     67 bpm            Pulse Range:    Between 52 and 88 bpm             IMPRESSION:  This HST confirms the presence of very mild and all obstructive sleep apnea with an AHI that was under 10/h following AASM criteria.  There was no clinically significant hypoxia associated, no bradycardia, and unfortunately we did not have positional data for the majority of the recording.  Sleep fragmentation however was evident.   RECOMMENDATION: The patient has lost weight, but she has not gained better quality sleep.  I would ask her if she feels that she is sleeping better and has more sleep quality when using CPAP. Only then would she need to continue therapy.     If a new device is needed, the dx of OSA is still standing and  I can issue a script for a new autotitration device based on this dx.  Airsense 11, ResMED Pressure between 6-12 cm water, 2 cm EPR and heated humidification, mask of her choice.       Review of Systems: Out of a complete 14 system review, the patient complains of only the following symptoms, and all other reviewed systems are negative.:   SLEEPINESS ?  How likely are you to  doze in the following situations: 0 = not likely, 1 = slight chance, 2 = moderate chance, 3 = high chance  Sitting and Reading? Watching Television? Sitting inactive in a public place (theater or meeting)? Lying down in the afternoon when circumstances permit? Sitting and talking to someone? Sitting quietly after lunch without alcohol? In a car, while stopped for a few minutes in traffic? As a passenger in a car for an hour without a break?  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 Two daughters live with them.  Oldest daughter is grown and lives locally with her husband and children.   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     Patient has a Multimedia Programmer.   Patient is right-handed.   Patient drinks one cup of caffeine daily.   Social Drivers of Corporate Investment Banker Strain: Low Risk  (07/22/2022)   Received from [facility]   Overall Financial Resource Strain (CARDIA)    Difficulty of Paying Living Expenses: Not hard at all  Food Insecurity: Low Risk  (09/21/2023)   Received from [facility]   Hunger Vital Sign    Within the past 12 months, you worried that your food would run out before you got money to buy more: Never true    Within the past 12 months, the food you bought just didn't last and you didn't have money to get more. : Never true  Transportation Needs: No Transportation Needs (09/21/2023)   Received from [facility]    In the past 12 months, has lack of reliable transportation kept you from medical appointments, meetings, work or from getting things needed for daily living? : No  Physical Activity: Unknown (07/22/2022)   [facility]   Exercise Vital Sign    On average, how many days per week do you engage in moderate to strenuous exercise (like a brisk walk)?: 4 days    Minutes of Exercise per Session: Not on file  Recent Concern: Physical Activity - Insufficiently Active (07/22/2022)    Received from [facility]   Exercise Vital Sign    Days of Exercise per Week: 4 days    Minutes of Exercise per Session: 30 min  Stress: No Stress Concern Present (07/22/2022)   Received from [facility] - Occupational Stress Questionnaire    Feeling of Stress : Not at all  Social Connections: Moderately Integrated (07/22/2022)   Received from [facility]   Social Network    How would you rate your  social network (family, work, friends)?: Adequate participation with social networks    Family History  Problem Relation Age of Onset   Hiatal hernia Mother    Irritable bowel syndrome Mother    Hypertension Mother    Thyroid  disease Mother    Heart disease Mother 82       AMI, 90% LAD, stent placement, cardiac arrest   Lung cancer Father        (+ TOBACCO)   Hypertension Sister    Seizures Sister    Headache Sister    Pancreatic cancer Paternal Uncle    Colitis Maternal Grandmother        Crohns VS U.C   Colon cancer Neg Hx    Sleep apnea Neg Hx     Past Medical History:  Diagnosis Date   Anxiety    Arrhythmia ventricular    s/p ablation 2001   Carpal tunnel syndrome, bilateral    Chronic RLQ pain    Dyslipidemia    Dysrhythmia    V- tach- resolved with ablation 2002   GERD (gastroesophageal reflux disease)    Headache    Hiatal hernia    History of migraine headaches    IBS (irritable bowel syndrome)    Insomnia    Menorrhagia    Mitral valve disorder    MVP (mitral valve prolapse)    Plantar fasciitis    PONV (postoperative nausea and vomiting)    Uterine fibroid    small   Ventricular tachycardia (HCC)     Past Surgical History:  Procedure Laterality Date   ABDOMINAL HYSTERECTOMY     BILATERAL SALPINGECTOMY Bilateral 12/07/2013   Procedure: BILATERAL SALPINGECTOMY;  Surgeon: Charlie JINNY Flowers, MD;  Location: [facility];  Service: Gynecology;  Laterality: Bilateral;   BREAST IMPLANT REMOVAL Bilateral 10/2020    CARDIAC ELECTROPHYSIOLOGY MAPPING AND ABLATION  01/07/2000   cspine  05/07/1999   c5-6   echocardiogram  2000/2001   KNEE SURGERY  01/07/1996   PATELLA ARTHROPLASTY     ROBOTIC ASSISTED TOTAL HYSTERECTOMY N/A 12/07/2013   Procedure: ROBOTIC ASSISTED TOTAL HYSTERECTOMY;  Surgeon: Charlie JINNY Flowers, MD;  Location: [facility];  Service: Gynecology;  Laterality: N/A;   SPINE SURGERY       Current Outpatient Medications on File Prior to Visit  Medication Sig Dispense Refill   atorvastatin  (LIPITOR) 20 MG tablet TAKE 1 TABLET BY MOUTH EVERY DAY 90 tablet 3   diclofenac (VOLTAREN) 50 MG EC tablet Take 50 mg by mouth 2 (two) times daily as needed.     Eszopiclone 3 MG TABS Take 3 mg by mouth at bedtime. Take immediately before bedtime     metoprolol  succinate (TOPROL -XL) 25 MG 24 hr tablet TAKE 1 TABLET (25 MG TOTAL) BY MOUTH DAILY. 30 tablet 6   metoprolol  tartrate (LOPRESSOR ) 25 MG tablet TAKE 1 TABLET BY MOUTH AS NEEDED (FOR PALPITATIONS). UP TO 4 TABLETS IN A 24 HOUR PERIOD. 180 tablet 2   Multiple Vitamin (MULTI-VITAMINS) TABS Take by mouth.     pantoprazole  (PROTONIX ) 40 MG tablet Take 40 mg by mouth daily.     sertraline  (ZOLOFT ) 25 MG tablet TAKE 1 TABLET (25 MG TOTAL) BY MOUTH DAILY. 90 tablet 1   tiZANidine (ZANAFLEX) 4 MG tablet Take 4 mg by mouth 3 (three) times daily.     traMADol  (ULTRAM ) 50 MG tablet Take 1 tablet by mouth as needed.     traZODone (DESYREL) 50 MG tablet Take 50 mg by mouth at bedtime as  needed.     Current Facility-Administered Medications on File Prior to Visit  Medication Dose Route Frequency Provider Last Rate Last Admin   0.9 %  sodium chloride  infusion  500 mL Intravenous Continuous Abran Norleen SAILOR, MD        Allergies  Allergen Reactions   Methocarbamol     Other reaction(s): Redness   Covera-Hs [Verapamil]     Blood blisters, Stevens-Johnson syndrome   Crestor [Rosuvastatin Calcium ]     Flu like symptoms, on different statin   Wellbutrin  [Bupropion ]  Palpitations     DIAGNOSTIC DATA (LABS, IMAGING, TESTING) - I reviewed patient records, labs, notes, testing and imaging myself where available.  Lab Results  Component Value Date   WBC 5.0 01/27/2023   HGB 13.6 01/27/2023   HCT 42.7 01/27/2023   MCV 96 01/27/2023   PLT 216 01/27/2023      Component Value Date/Time   NA 140 01/27/2023 0945   K 4.2 01/27/2023 0945   CL 97 01/27/2023 0945   CO2 29 01/27/2023 0945   GLUCOSE 93 01/27/2023 0945   GLUCOSE 110 (H) 05/02/2022 1900   BUN 21 01/27/2023 0945   CREATININE 0.83 01/27/2023 0945   CREATININE 0.71 07/03/2015 0905   CALCIUM  9.7 01/27/2023 0945   PROT 7.5 01/27/2023 0945   ALBUMIN 5.0 (H) 01/27/2023 0945   AST 19 01/27/2023 0945   ALT 11 01/27/2023 0945   ALKPHOS 70 01/27/2023 0945   BILITOT 0.6 01/27/2023 0945   GFRNONAA >60 05/02/2022 1900   GFRAA >60 07/20/2018 1934   Lab Results  Component Value Date   CHOL 222 (H) 07/08/2016   HDL 58 07/08/2016   LDLCALC 127 (H) 07/08/2016   TRIG 186 (H) 07/08/2016   CHOLHDL 3.8 07/08/2016   Lab Results  Component Value Date   HGBA1C 5.5 01/27/2023   Lab Results  Component Value Date   VITAMINB12 >2000 (H) 01/27/2023   Lab Results  Component Value Date   TSH 2.120 01/27/2023    PHYSICAL EXAM:  Vitals:   11/09/23 1322  BP: 108/65  Pulse: 65   No data found. Body mass index is 26.26 kg/m.   Wt Readings from Last 3 Encounters:  11/09/23 153 lb (69.4 kg)  03/10/23 155 lb 3.2 oz (70.4 kg)  01/27/23 160 lb 6.4 oz (72.8 kg)     Ht Readings from Last 3 Encounters:  11/09/23 5' 4 (1.626 m)  03/10/23 5' 4 (1.626 m)  01/27/23 5' 4 (1.626 m)      General: The patient is awake, alert and appears not in acute distress and groomed. Head: Normocephalic, atraumatic.  Neck is supple. Normocephalic, atraumatic.'  Cardiovascular:  Regular rate and cardiac rhythm by pulse,  without distended neck veins. Respiratory: Lungs are clear to auscultation.  Skin:   Without evidence of ankle edema, or rash. BMI 26.  Neck 14 inches  Trunk: The patient's posture is erect.   NEUROLOGIC EXAM: The patient is awake and alert, oriented to place and time.   Memory subjective described as impaired, but less than 12 months ago.  >moca 28/ 30 today , lost one point in cube drawing and one in trial making .  Named  exactly 11 words.       No data to display          Attention span & concentration ability appears normal.  Speech is fluent,  without  dysarthria, dysphonia or aphasia.  Mood and affect are appropriate.   Cranial nerves:  no loss of smell or taste reported  Pupils are equal and briskly reactive to light. Funduscopic exam deferred. .  Extraocular movements in vertical and horizontal planes were intact and without nystagmus. No Diplopia. Visual fields by finger perimetry are intact. Hearing was intact to soft voice and finger rubbing.    Facial sensation intact to fine touch.  Facial motor strength is symmetric and tongue and uvula move midline.  Neck ROM : rotation, tilt and flexion extension were normal for age and shoulder shrug was symmetrical.    Motor exam:  Symmetric bulk, tone and ROM.   Normal tone without cog wheeling, symmetric grip strength .   Sensory:  Fine touch, pinprick and vibration were  normal.  Proprioception tested in the upper extremities was normal.   Coordination: Rapid alternating movements in the fingers/hands were of normal speed.  The Finger-to-nose maneuver was intact without evidence of ataxia, dysmetria or tremor.   Gait and station: Patient could rise unassisted from a seated position, walked without assistive device.  Deep tendon reflexes: in the  upper and lower extremities are symmetric and intact.  Babinski response was deferred / normal   ASSESSMENT AND PLAN :   58 y.o. year old female  here with:chronic fatigue and Insomnia , memory concerns.    Frequent upper respiratory infections: 1) IgA  deficiency  It is estimated that 1 in 500 people have IgA deficiency, most are of European descent.  Many are asymptomatic-  others have more frequent respiratory infections (e.g., sinusitis, bronchitis, pneumonia) than their peers, Gastrointestinal problems (e.g., diarrhea, abdominal pain), Allergic reactions or Autoimmune disorders.    2) chronic fatigue and Insomnia  have IMPROVED ,  weaned off Alprazolam .  She was only taking it a night and once off she has much less muscle ache and better sleep, better daytime concentration, better cognitive function. She felt at times as if she had flu every day.    3) continuous to use CPAP for mild OSA. New CPAP- order, she is at least due for a new one. Her gasping for air continuously  has not stopped, she just experienced that again after not using CPAP for a night.    4) I tested her today by [facility] - she did very well.     Rv prn in 12 months.  I will offer a repeat sleep test. And I have offered referral for new neuropsychological testing.     I would like to thank Elpidio Rosaline Crank, DO  for allowing me to follow up  with this pleasant patient.  Sleep Clinic Patients are generally offered input on sleep hygiene, life style changes and how to improve compliance with medical treatment where applicable. Review and reiteration of good sleep hygiene measures is offered to any sleep clinic patient,  Any patient with sleepiness should be cautioned not to drive, work at heights, or operate dangerous or heavy equipment when feeling tired or sleepy.   The referring provider will be notified of the test results.   The patient's condition requires frequent monitoring and adjustments in the treatment plan, reflecting the ongoing complexity of care.  This provider is the continuing focal point for all needed services for this condition.  After spending a total time of  45  minutes face to face and time for  history taking, physical and neurologic  examination, review of laboratory studies,  personal review of imaging studies, reports and results of other testing and review of referral information / records as  far as provided in visit,   Electronically signed by: Dedra Gores, MD 11/09/2023 1:52 PM  [facility] Board certified by The [employer] of Sleep Medicine and Diplomate of the [employer] of Sleep Medicine. Board certified In Neurology through the ABPN, Fellow of the [employer] of Neurology.

## 2023-11-10 ENCOUNTER — Ambulatory Visit

## 2023-11-10 ENCOUNTER — Telehealth: Payer: Self-pay | Admitting: Neurology

## 2023-11-10 NOTE — Progress Notes (Signed)
 Community message has been sent to [employer] for pressure and supplies on 11/10/23. DD

## 2023-11-10 NOTE — Progress Notes (Signed)
   Message has been sent to Dr.Dohmeier

## 2023-11-10 NOTE — Telephone Encounter (Signed)
 Referral to neuropsychology sent thru epic to [facility]  [facility] Phone: [phone]

## 2023-11-15 ENCOUNTER — Other Ambulatory Visit: Payer: Self-pay | Admitting: Neurology

## 2023-11-15 DIAGNOSIS — R519 Headache, unspecified: Secondary | ICD-10-CM

## 2023-11-15 DIAGNOSIS — G9332 Myalgic encephalomyelitis/chronic fatigue syndrome: Secondary | ICD-10-CM

## 2023-11-15 DIAGNOSIS — F13930 Sedative, hypnotic or anxiolytic use, unspecified with withdrawal, uncomplicated: Secondary | ICD-10-CM

## 2023-11-15 DIAGNOSIS — G4733 Obstructive sleep apnea (adult) (pediatric): Secondary | ICD-10-CM

## 2023-11-15 DIAGNOSIS — R5382 Chronic fatigue, unspecified: Secondary | ICD-10-CM

## 2023-11-15 NOTE — Progress Notes (Unsigned)
 AUTO TITRATION ResMed CPAP DEVICE ORDERED.  Please inquire about patients preference of DME before sending the order.

## 2023-11-18 ENCOUNTER — Ambulatory Visit: Admitting: Internal Medicine

## 2023-11-24 ENCOUNTER — Ambulatory Visit
Admission: RE | Admit: 2023-11-24 | Discharge: 2023-11-24 | Disposition: A | Discharge status: Home / Self Care | Source: Ambulatory Visit | Attending: Family Medicine | Admitting: Family Medicine

## 2023-11-24 DIAGNOSIS — Z0000 Encounter for general adult medical examination without abnormal findings: Secondary | ICD-10-CM

## 2023-11-26 ENCOUNTER — Ambulatory Visit: Admitting: Student

## 2023-11-30 ENCOUNTER — Other Ambulatory Visit: Payer: Self-pay | Admitting: Family Medicine

## 2023-11-30 DIAGNOSIS — R928 Other abnormal and inconclusive findings on diagnostic imaging of breast: Secondary | ICD-10-CM

## 2023-12-10 ENCOUNTER — Encounter

## 2023-12-17 ENCOUNTER — Ambulatory Visit
Admission: RE | Admit: 2023-12-17 | Discharge: 2023-12-17 | Disposition: A | Discharge status: Home / Self Care | Source: Ambulatory Visit | Attending: Family Medicine | Admitting: Family Medicine

## 2023-12-17 DIAGNOSIS — R928 Other abnormal and inconclusive findings on diagnostic imaging of breast: Secondary | ICD-10-CM

## 2023-12-18 ENCOUNTER — Other Ambulatory Visit: Payer: Self-pay | Admitting: Family Medicine

## 2023-12-18 DIAGNOSIS — R928 Other abnormal and inconclusive findings on diagnostic imaging of breast: Secondary | ICD-10-CM

## 2023-12-23 ENCOUNTER — Other Ambulatory Visit: Payer: Self-pay | Admitting: Internal Medicine

## 2023-12-24 MED ORDER — METOPROLOL SUCCINATE ER 25 MG PO TB24: 25.0000 mg | ORAL_TABLET | Freq: Every day | ORAL | 0 refills | Status: DC

## 2024-01-04 ENCOUNTER — Encounter: Payer: Self-pay | Admitting: Psychology

## 2024-01-04 NOTE — Progress Notes (Deleted)
" °  Electrophysiology Office Note:   Date:  01/04/2024  ID:  [name], DOB 12/12/1965, MRN [id number]  Primary Cardiologist: None Electrophysiologist: Danelle Birmingham, MD   Electrophysiologist:  Danelle Birmingham, MD  {Click to update primary MD,subspecialty MD or APP then REFRESH:1}    History of Present Illness:   [name] is a 58 y.o. female with h/o palpitations and PVCs seen today for routine electrophysiology followup.   Since last being seen in our clinic the patient reports doing ***.  she denies chest pain, palpitations, dyspnea, PND, orthopnea, nausea, vomiting, dizziness, syncope, edema, weight gain, or early satiety.   Review of systems complete and found to be negative unless listed in HPI.   EP Information / Studies Reviewed:    EKG is ordered today. Personal review as below.       Arrhythmia/Device History No specialty comments available.   Physical Exam:   VS:  LMP 12/03/2014    Wt Readings from Last 3 Encounters:  11/09/23 153 lb (69.4 kg)  03/10/23 155 lb 3.2 oz (70.4 kg)  01/27/23 160 lb 6.4 oz (72.8 kg)     GEN: No acute distress NECK: No JVD; No carotid bruits CARDIAC: {EPRHYTHM:28826}, no murmurs, rubs, gallops RESPIRATORY:  Clear to auscultation without rales, wheezing or rhonchi  ABDOMEN: Soft, non-tender, non-distended EXTREMITIES:  {EDEMA LEVEL:28147::No} edema; No deformity   ASSESSMENT AND PLAN:    Palpitations PVCs EKG today shows *** Well controlled overall on toprol  25 mg daily with lopressor  prn.  {Click here to Review PMH, Prob List, Meds, Allergies, SHx, FHx  :1}   Follow up with {EPMDS:28135::EP Team} {EPFOLLOW UP:28173}  Signed, Ozell Prentice Passey, PA-C  "

## 2024-01-05 ENCOUNTER — Ambulatory Visit: Attending: Student | Admitting: Student

## 2024-01-05 DIAGNOSIS — I493 Ventricular premature depolarization: Secondary | ICD-10-CM

## 2024-01-05 DIAGNOSIS — R002 Palpitations: Secondary | ICD-10-CM

## 2024-01-08 ENCOUNTER — Encounter: Payer: Self-pay | Admitting: Student

## 2024-01-20 ENCOUNTER — Other Ambulatory Visit: Payer: Self-pay | Admitting: Internal Medicine

## 2024-02-04 ENCOUNTER — Telehealth: Payer: Self-pay | Admitting: *Deleted

## 2024-02-16 ENCOUNTER — Encounter: Admitting: Psychology

## 2024-06-17 ENCOUNTER — Encounter

## 2024-11-14 ENCOUNTER — Ambulatory Visit: Admitting: Adult Health
